# Patient Record
Sex: Male | Born: 1995 | Race: Black or African American | Hispanic: No | Marital: Single | Smoking: Never smoker
Health system: Southern US, Community
[De-identification: ages and names within clinical notes are randomized; demographics above are authoritative.]

## PROBLEM LIST (undated history)

## (undated) DIAGNOSIS — R000 Tachycardia, unspecified: Secondary | ICD-10-CM

## (undated) DIAGNOSIS — R339 Retention of urine, unspecified: Secondary | ICD-10-CM

## (undated) DIAGNOSIS — E46 Unspecified protein-calorie malnutrition: Secondary | ICD-10-CM

## (undated) DIAGNOSIS — R569 Unspecified convulsions: Secondary | ICD-10-CM

## (undated) DIAGNOSIS — E119 Type 2 diabetes mellitus without complications: Secondary | ICD-10-CM

## (undated) DIAGNOSIS — I63331 Cerebral infarction due to thrombosis of right posterior cerebral artery: Secondary | ICD-10-CM

## (undated) DIAGNOSIS — S069XAA Unspecified intracranial injury with loss of consciousness status unknown, initial encounter: Secondary | ICD-10-CM

---

## 2021-09-25 ENCOUNTER — Emergency Department (HOSPITAL_COMMUNITY): Payer: Medicaid Other

## 2021-09-25 ENCOUNTER — Inpatient Hospital Stay (HOSPITAL_COMMUNITY)
Admission: EM | Admit: 2021-09-25 | Discharge: 2021-11-25 | DRG: 003 | Disposition: A | Discharge status: Skilled Nursing Facility | Payer: Medicaid Other | Attending: Surgery | Admitting: Surgery

## 2021-09-25 DIAGNOSIS — Z20822 Contact with and (suspected) exposure to covid-19: Secondary | ICD-10-CM | POA: Diagnosis present

## 2021-09-25 DIAGNOSIS — B9620 Unspecified Escherichia coli [E. coli] as the cause of diseases classified elsewhere: Secondary | ICD-10-CM | POA: Diagnosis not present

## 2021-09-25 DIAGNOSIS — G8191 Hemiplegia, unspecified affecting right dominant side: Secondary | ICD-10-CM | POA: Diagnosis present

## 2021-09-25 DIAGNOSIS — R21 Rash and other nonspecific skin eruption: Secondary | ICD-10-CM | POA: Diagnosis not present

## 2021-09-25 DIAGNOSIS — K9421 Gastrostomy hemorrhage: Secondary | ICD-10-CM | POA: Diagnosis not present

## 2021-09-25 DIAGNOSIS — S02119A Unspecified fracture of occiput, initial encounter for closed fracture: Secondary | ICD-10-CM | POA: Diagnosis present

## 2021-09-25 DIAGNOSIS — I63531 Cerebral infarction due to unspecified occlusion or stenosis of right posterior cerebral artery: Secondary | ICD-10-CM | POA: Diagnosis not present

## 2021-09-25 DIAGNOSIS — R1310 Dysphagia, unspecified: Secondary | ICD-10-CM | POA: Diagnosis present

## 2021-09-25 DIAGNOSIS — I10 Essential (primary) hypertension: Secondary | ICD-10-CM | POA: Diagnosis present

## 2021-09-25 DIAGNOSIS — Y92410 Unspecified street and highway as the place of occurrence of the external cause: Secondary | ICD-10-CM

## 2021-09-25 DIAGNOSIS — R339 Retention of urine, unspecified: Secondary | ICD-10-CM | POA: Diagnosis not present

## 2021-09-25 DIAGNOSIS — E876 Hypokalemia: Secondary | ICD-10-CM | POA: Diagnosis present

## 2021-09-25 DIAGNOSIS — S066XAA Traumatic subarachnoid hemorrhage with loss of consciousness status unknown, initial encounter: Principal | ICD-10-CM | POA: Diagnosis present

## 2021-09-25 DIAGNOSIS — R402212 Coma scale, best verbal response, none, at arrival to emergency department: Secondary | ICD-10-CM | POA: Diagnosis present

## 2021-09-25 DIAGNOSIS — Z515 Encounter for palliative care: Secondary | ICD-10-CM

## 2021-09-25 DIAGNOSIS — J189 Pneumonia, unspecified organism: Secondary | ICD-10-CM | POA: Diagnosis not present

## 2021-09-25 DIAGNOSIS — Y9301 Activity, walking, marching and hiking: Secondary | ICD-10-CM | POA: Diagnosis present

## 2021-09-25 DIAGNOSIS — R403 Persistent vegetative state: Secondary | ICD-10-CM | POA: Diagnosis present

## 2021-09-25 DIAGNOSIS — Y95 Nosocomial condition: Secondary | ICD-10-CM | POA: Diagnosis not present

## 2021-09-25 DIAGNOSIS — R402122 Coma scale, eyes open, to pain, at arrival to emergency department: Secondary | ICD-10-CM | POA: Diagnosis present

## 2021-09-25 DIAGNOSIS — S065XAA Traumatic subdural hemorrhage with loss of consciousness status unknown, initial encounter: Secondary | ICD-10-CM | POA: Diagnosis present

## 2021-09-25 DIAGNOSIS — R569 Unspecified convulsions: Secondary | ICD-10-CM | POA: Diagnosis not present

## 2021-09-25 DIAGNOSIS — Z23 Encounter for immunization: Secondary | ICD-10-CM | POA: Diagnosis not present

## 2021-09-25 DIAGNOSIS — J9601 Acute respiratory failure with hypoxia: Secondary | ICD-10-CM | POA: Diagnosis present

## 2021-09-25 DIAGNOSIS — S061XAA Traumatic cerebral edema with loss of consciousness status unknown, initial encounter: Secondary | ICD-10-CM | POA: Diagnosis present

## 2021-09-25 DIAGNOSIS — R402342 Coma scale, best motor response, flexion withdrawal, at arrival to emergency department: Secondary | ICD-10-CM | POA: Diagnosis present

## 2021-09-25 DIAGNOSIS — J9811 Atelectasis: Secondary | ICD-10-CM | POA: Diagnosis not present

## 2021-09-25 DIAGNOSIS — T1490XA Injury, unspecified, initial encounter: Principal | ICD-10-CM

## 2021-09-25 DIAGNOSIS — S069XAA Unspecified intracranial injury with loss of consciousness status unknown, initial encounter: Secondary | ICD-10-CM

## 2021-09-25 LAB — CBC
HCT: 44.3 % (ref 39.0–52.0)
Hemoglobin: 14.6 g/dL (ref 13.0–17.0)
MCH: 31 pg (ref 26.0–34.0)
MCHC: 33 g/dL (ref 30.0–36.0)
MCV: 94.1 fL (ref 80.0–100.0)
Platelets: 205 10*3/uL (ref 150–400)
RBC: 4.71 MIL/uL (ref 4.22–5.81)
RDW: 11.8 % (ref 11.5–15.5)
WBC: 18.8 10*3/uL — ABNORMAL HIGH (ref 4.0–10.5)
nRBC: 0 % (ref 0.0–0.2)

## 2021-09-25 LAB — SAMPLE TO BLOOD BANK

## 2021-09-25 LAB — I-STAT CHEM 8, ED
BUN: 12 mg/dL (ref 6–20)
Calcium, Ion: 1.12 mmol/L — ABNORMAL LOW (ref 1.15–1.40)
Chloride: 107 mmol/L (ref 98–111)
Creatinine, Ser: 1.4 mg/dL — ABNORMAL HIGH (ref 0.61–1.24)
Glucose, Bld: 128 mg/dL — ABNORMAL HIGH (ref 70–99)
HCT: 43 % (ref 39.0–52.0)
Hemoglobin: 14.6 g/dL (ref 13.0–17.0)
Potassium: 2.9 mmol/L — ABNORMAL LOW (ref 3.5–5.1)
Sodium: 144 mmol/L (ref 135–145)
TCO2: 24 mmol/L (ref 22–32)

## 2021-09-25 LAB — COMPREHENSIVE METABOLIC PANEL
ALT: 39 U/L (ref 0–44)
AST: 37 U/L (ref 15–41)
Albumin: 3.9 g/dL (ref 3.5–5.0)
Alkaline Phosphatase: 61 U/L (ref 38–126)
Anion gap: 12 (ref 5–15)
BUN: 11 mg/dL (ref 6–20)
CO2: 22 mmol/L (ref 22–32)
Calcium: 8.9 mg/dL (ref 8.9–10.3)
Chloride: 109 mmol/L (ref 98–111)
Creatinine, Ser: 1.42 mg/dL — ABNORMAL HIGH (ref 0.61–1.24)
GFR, Estimated: >60 mL/min (ref >60)
Glucose, Bld: 131 mg/dL — ABNORMAL HIGH (ref 70–99)
Potassium: 2.8 mmol/L — ABNORMAL LOW (ref 3.5–5.1)
Sodium: 143 mmol/L (ref 135–145)
Total Bilirubin: 1.2 mg/dL (ref 0.3–1.2)
Total Protein: 6.2 g/dL — ABNORMAL LOW (ref 6.5–8.1)

## 2021-09-25 LAB — ETHANOL: Alcohol, Ethyl (B): <10 mg/dL (ref <10)

## 2021-09-25 LAB — PROTIME-INR
INR: 1.2 (ref 0.8–1.2)
Prothrombin Time: 15 seconds (ref 11.4–15.2)
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
## 2021-09-25 IMAGING — CT CT CERVICAL SPINE W/O CM
4 of 5 series · 12 of 34 positions shown, 14 images · non-contrast
Comparison: None available.

CLINICAL DATA: Initial evaluation for acute head trauma, abnormal
mental status.



[Series 7: c spine soft thins · axial · 0.36mm/px · z∈[-12,+20]mm · 2 of 191 slices shown]
[im 32/191  soft-tissue]
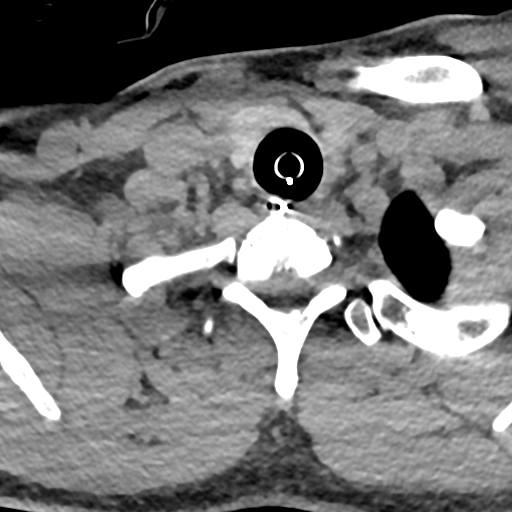
[im 64/191  soft-tissue]
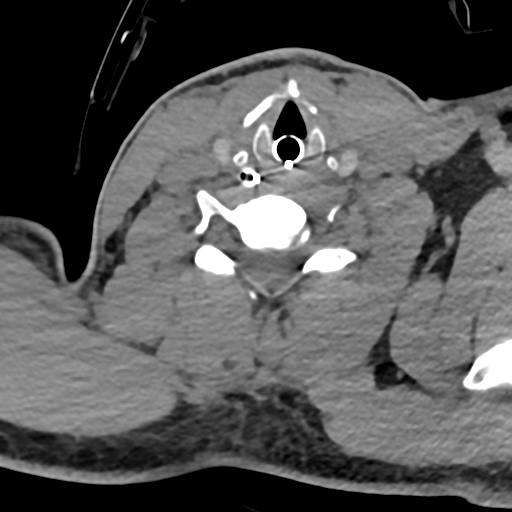

[Series 8: sag bone · sagittal · 0.34mm/px · 5 of 108 slices shown, 6 images]
[im 36/108  bone]
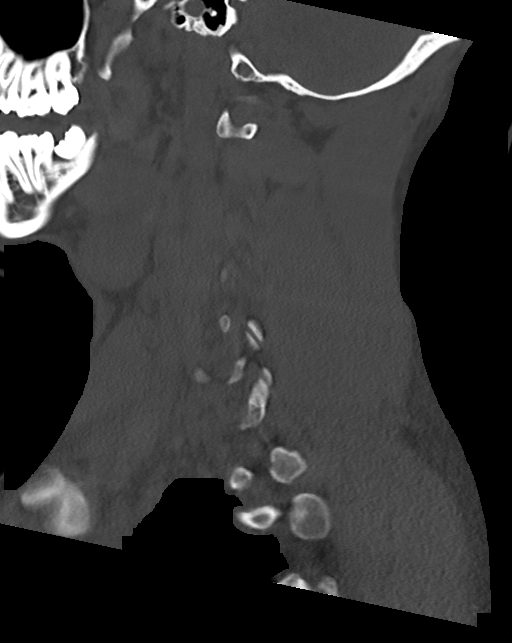
[im 45/108  bone]
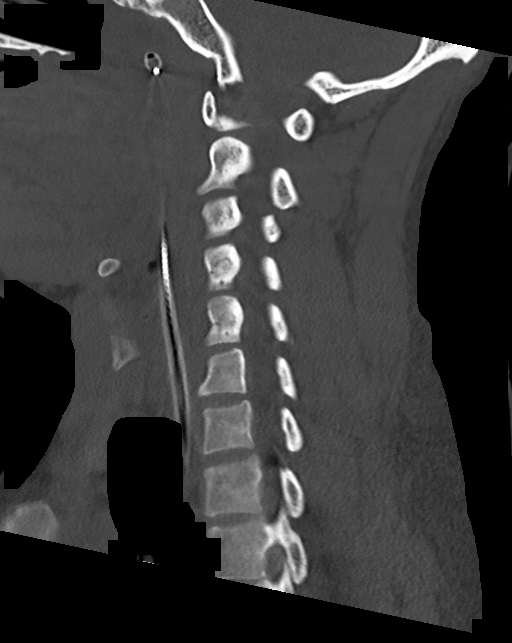
[im 54/108  soft-tissue]
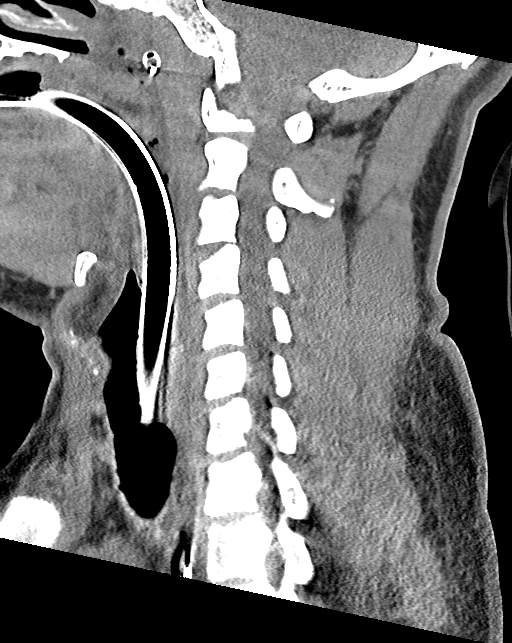
[im 54/108  bone]
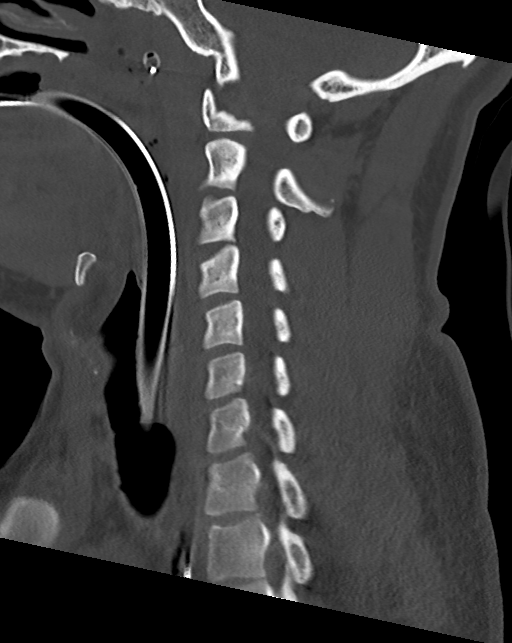
[im 63/108  bone]
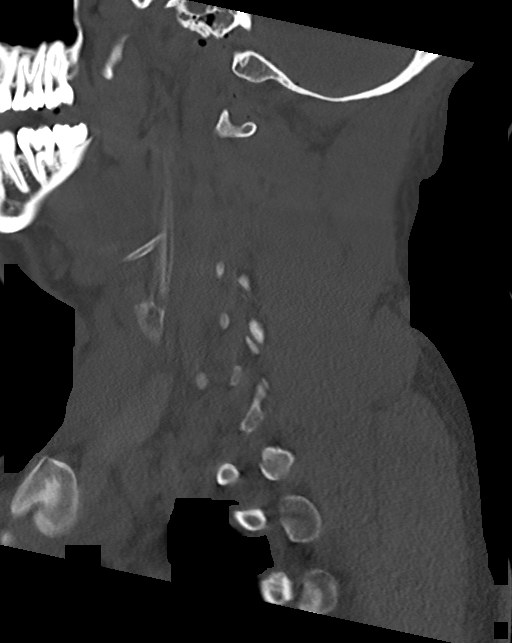
[im 72/108  bone]
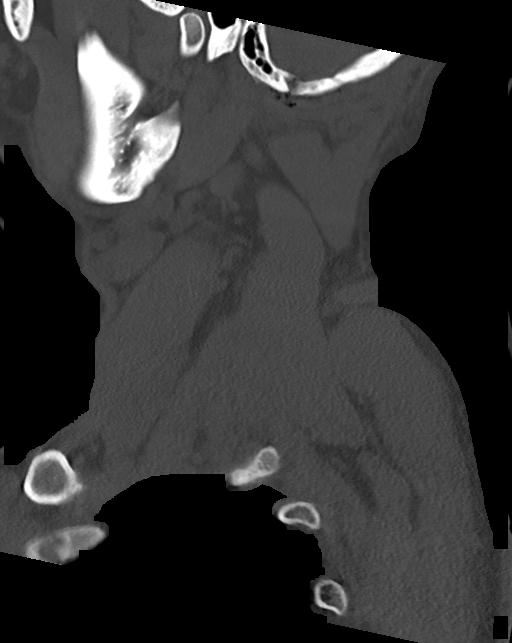

[Series 9: cor bone · coronal · 0.37mm/px · 3 of 69 slices shown]
[im 14/69  bone]
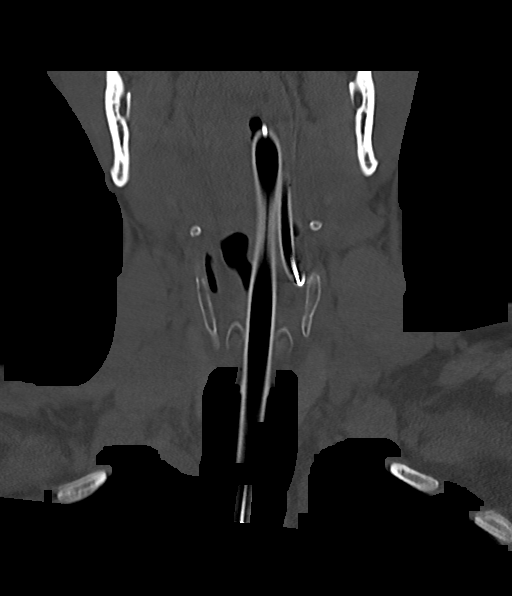
[im 28/69  bone]
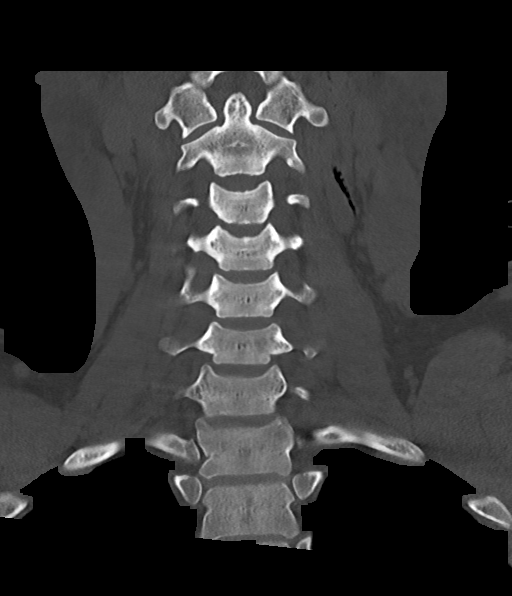
[im 41/69  bone]
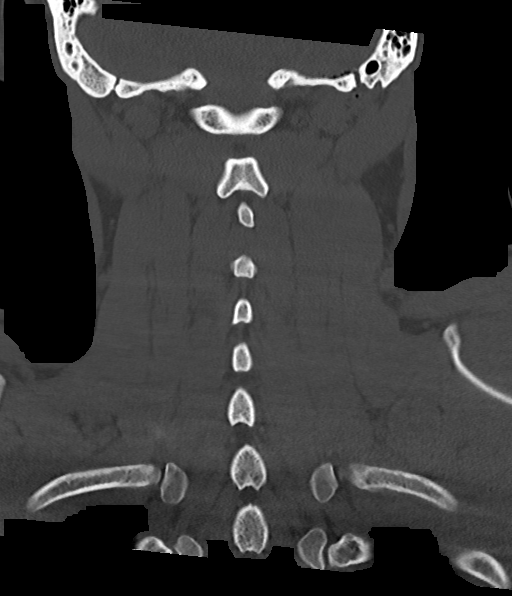

[Series 10: orthogonal axials · axial · 0.21mm/px · z∈[+2,+59]mm · 2 of 96 slices shown, 3 images]
[im 32/96  soft-tissue]
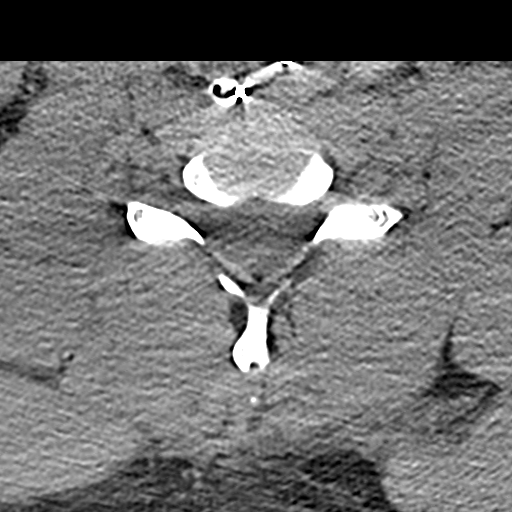
[im 32/96  bone]
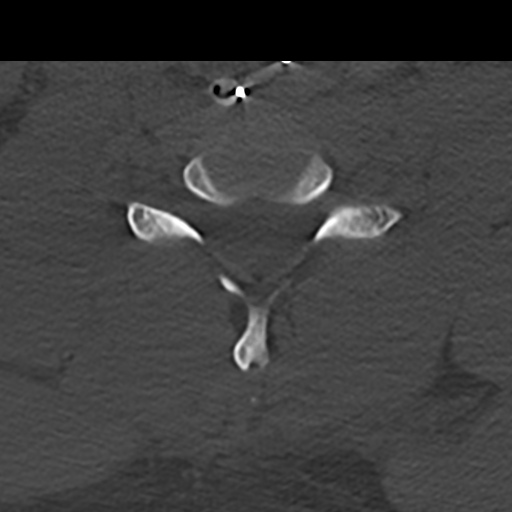
[im 64/96  bone]
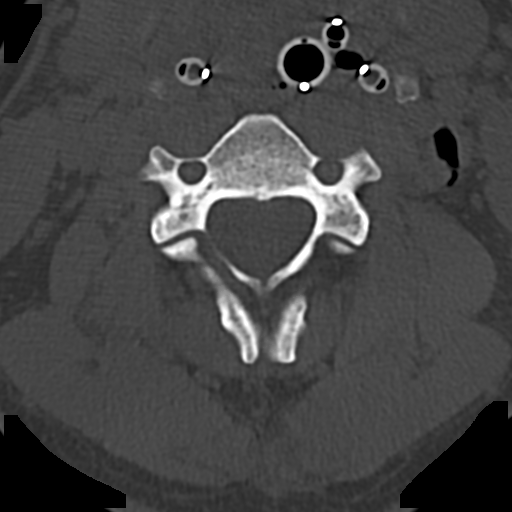

[12 of 34 positions shown; findings below may reference images not displayed]

FINDINGS: CT HEAD FINDINGS

Brain: Acute extra-axial hemorrhage overlying the right cerebral
convexity measures up to 6 mm in maximal thickness at the level of
the right frontotemporal region, likely subdural in location.
Extra-axial blood extends along the falx, with the parafalcine
component measuring up to 6 mm in maximal thickness as well.
Associated scattered posttraumatic subarachnoid hemorrhage along the
anterior falx and bilateral frontal regions. Subarachnoid blood also
seen about the basilar cisterns, most notable at the prepontine
cistern. Associated evolving bifrontal hemorrhagic contusions
involving the anterior inferior frontal lobes and at least right
temporal pole. Associated mass effect with up to 7 mm of
right-to-left shift. No hydrocephalus or trapping at this point.
Associated crowding of the basilar cisterns without frank
transtentorial herniation. Scattered small volume pneumocephalus
overlies the posterior left cerebral convexity related to an
overlying calvarial fracture. Small volume extra-axial blood also
seen overlying the left cerebral convexity, measuring up to 3 mm in
maximal thickness. No acute infarct. No mass lesion.

Vascular: No visible hyperdense vessel, although evaluation limited
by the presence of subarachnoid hemorrhage.

Skull: Acute calvarial fracture involving the left frontoparietal
and temporal calvarium (series 4, image 63). Associated minimal 3 mm
of displacement. Extension along the right lambdoid suture with
slight asymmetric diastasis/widening. Note again made of scattered
foci of underlying pneumocephalus. Overlying soft tissue
swelling/contusion and emphysema present at the left
parieto-occipital scalp. No visible involvement of the tympanic or
petrosal portions of the left temporal bone. Medial extension of the
fracture across the skull base, with involvement of the dominant
left sphenoid sinus (series 4, image 23). Probable extension through
the left carotid canal as well (series 4, image 23). Few scattered
foci of gas present within the left carotid canal itself. Probable
subtle extension through the sella turcica and clivus noted as well.

Sinuses/Orbits: Globes and orbital soft tissues within normal
limits. Layering blood products present within the left sphenoid
sinus related to the skull base fracture. Paranasal sinuses are
otherwise clear. Nasogastric tube partially visualized coiled within
the oropharynx.

Other: None.

CT CERVICAL SPINE FINDINGS

Alignment: Straightening of the normal cervical lordosis. No
listhesis or malalignment.

Skull base and vertebrae: Skull base intact. Normal C1-2
articulations are preserved in the dens is intact. Vertebral body
heights maintained. No acute fracture.

Soft tissues and spinal canal: Scattered soft tissue emphysema
present within the upper left neck related to the left-sided skull
fractures. Enteric tube partially coiled within the oropharynx
before coursing inferiorly. Endotracheal tube in place as well.
Spinal canal within normal limits.

Disc levels:  Unremarkable.

Upper chest: Visualized upper chest demonstrates no acute finding.
Partially visualized lung apices are clear.

Other: None.
IMPRESSION: CT BRAIN:

1. Acute extra-axial hemorrhage overlying the right cerebral
convexity measuring up to 6 mm in maximal thickness, likely subdural
in location. Associated scattered posttraumatic subarachnoid
hemorrhage along the falx, anterior frontal lobes, and basilar
cisterns, with evolving bifrontal hemorrhagic contusions. Associated
mass effect with up to 7 mm of right-to-left shift. No hydrocephalus
or trapping at this time.
2. Additional smaller extra-axial hemorrhage overlying the left
cerebral convexity measuring up to 3 mm in maximal thickness.
3. Acute complex fracture involving the left calvarium, with
extension along the left lambdoid suture and across the skull base.
Probable extension through the left carotid canal. Further
evaluation with dedicated CTA recommended to evaluate for potential
vascular injury.

CT CERVICAL SPINE:

No acute traumatic injury within the cervical spine.

Critical Value/emergent results were called by telephone at the time
of interpretation on [DATE] at [DATE] to provider Dr. LANCIU,
Who verbally acknowledged these results.
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
## 2021-09-25 IMAGING — CT CT CHEST-ABD-PELV W/ CM
2 of 5 series · 14 of 46 positions shown, 16 images · IV contrast (omnipaque)
Comparison: None.

CLINICAL DATA: Status post trauma.

EXAM:
CT CHEST, ABDOMEN, AND PELVIS WITH CONTRAST
TECHNIQUE: Multidetector CT imaging of the chest, abdomen and pelvis was
performed following the standard protocol during bolus
administration of intravenous contrast.
RADIATION DOSE REDUCTION: This exam was performed according to the
departmental dose-optimization program which includes automated
exposure control, adjustment of the mA and/or kV according to
patient size and/or use of iterative reconstruction technique.
CONTRAST:  Living mL of Omnipaque 350

[Series 3: cap with · axial · 0.80mm/px · z∈[-590,-40]mm · 11 of 130 slices shown, 13 images]
[im 10/130  soft-tissue]
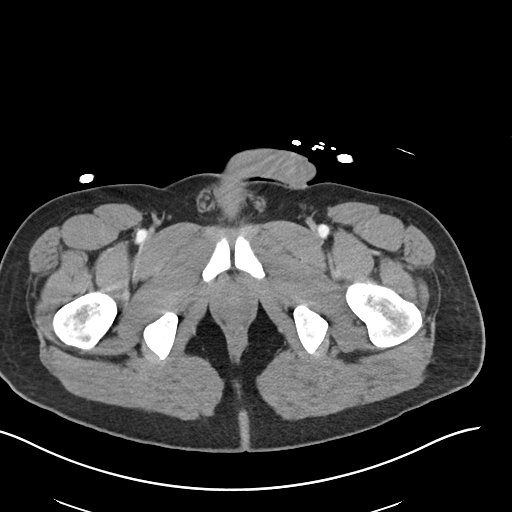
[im 10/130  bone]
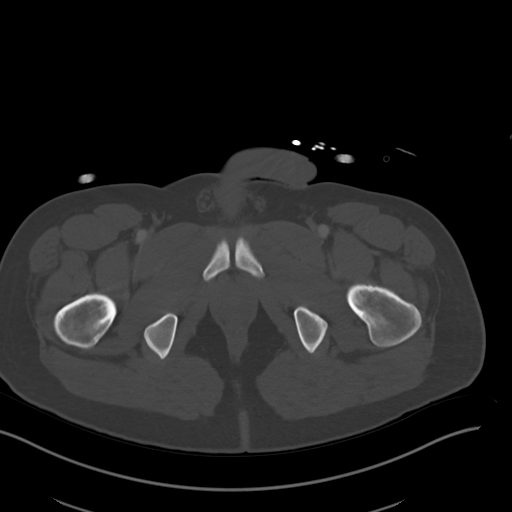
[im 19/130  soft-tissue]
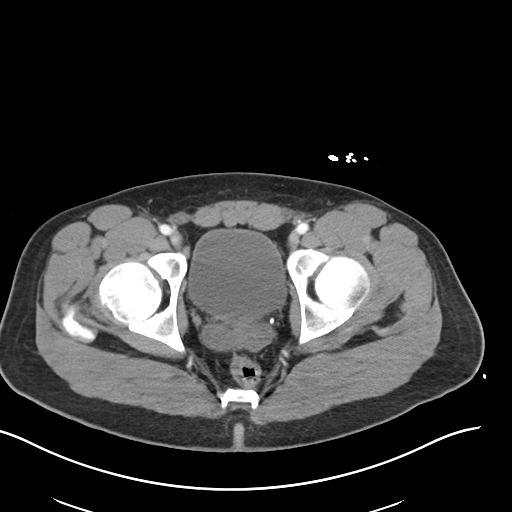
[im 28/130  soft-tissue]
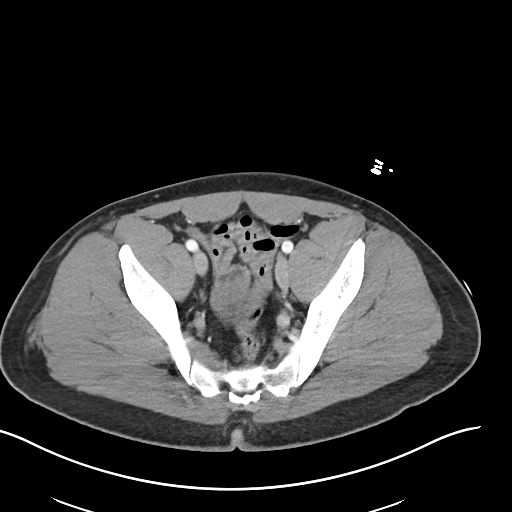
[im 47/130  soft-tissue]
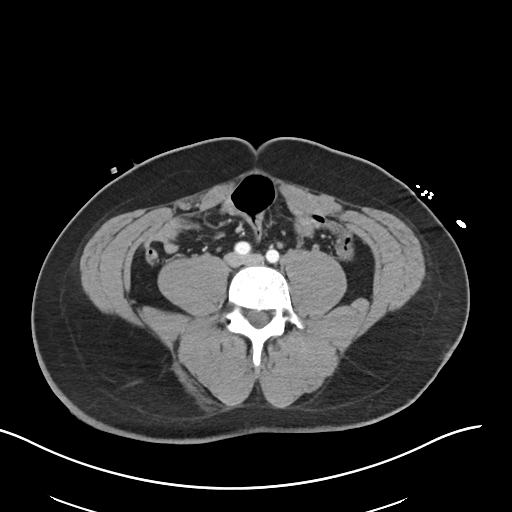
[im 56/130  soft-tissue]
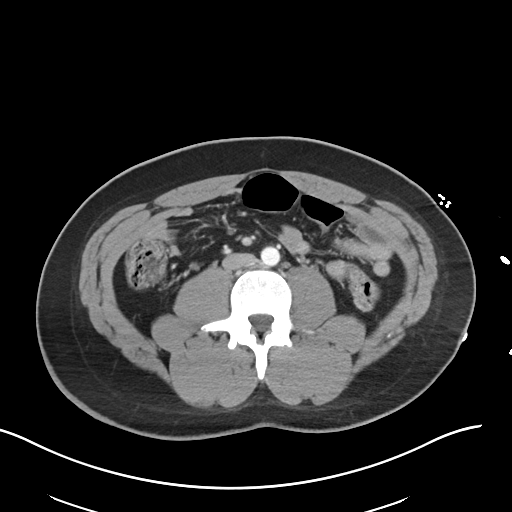
[im 65/130  soft-tissue]
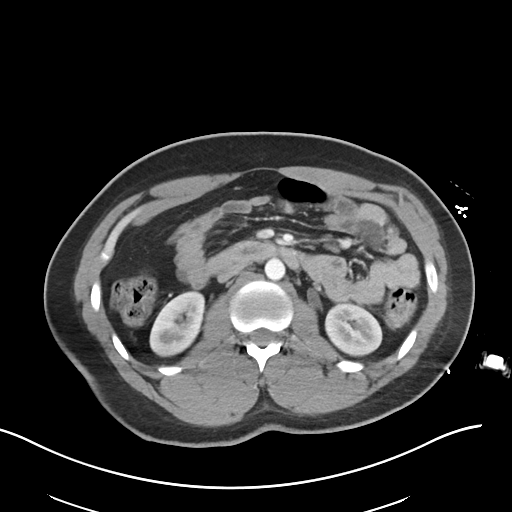
[im 74/130  soft-tissue]
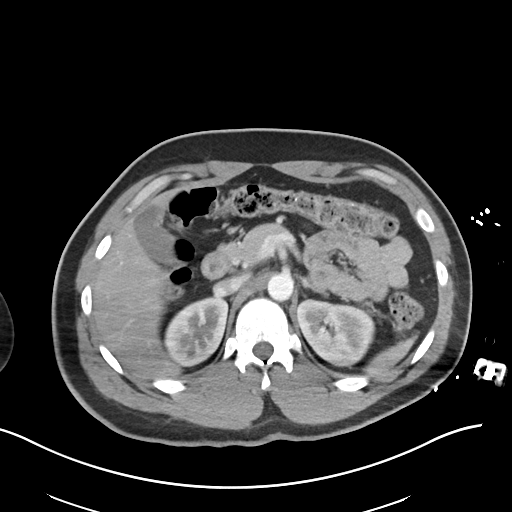
[im 83/130  soft-tissue]
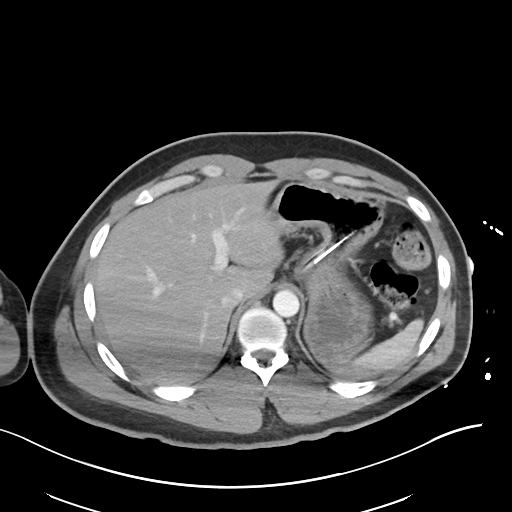
[im 102/130  soft-tissue]
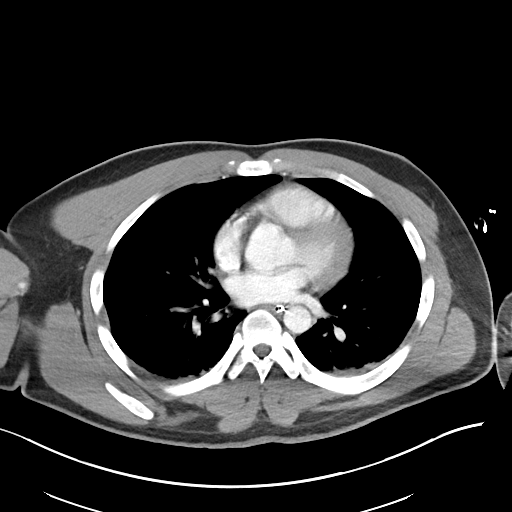
[im 102/130  bone]
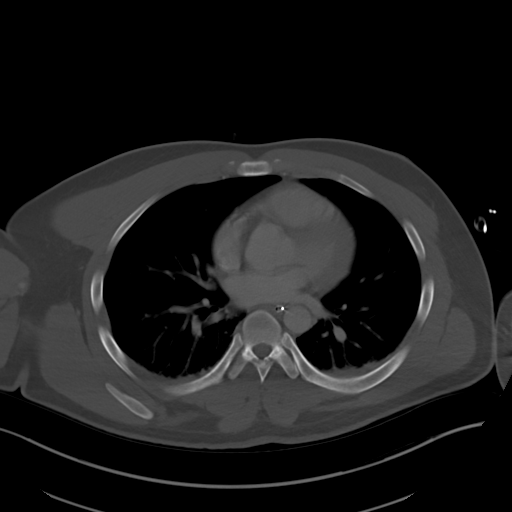
[im 111/130  soft-tissue]
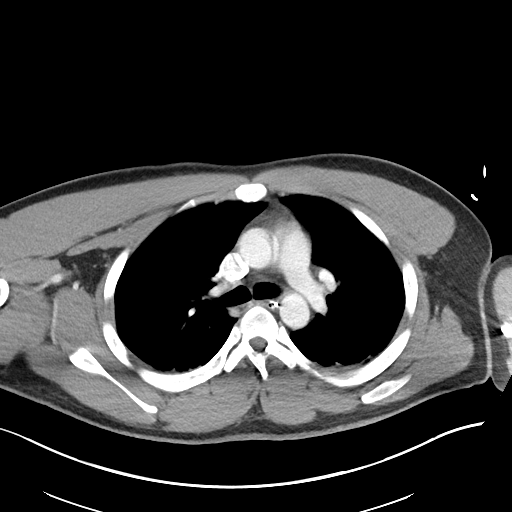
[im 120/130  soft-tissue]
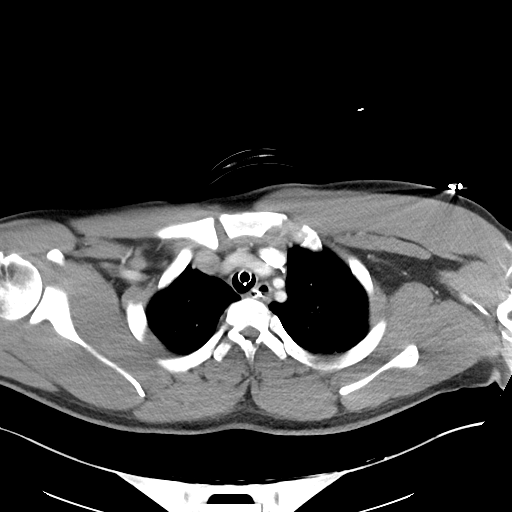

[Series 6: cor · coronal · 0.75mm/px · 3 of 87 slices shown]
[im 29/87  soft-tissue]
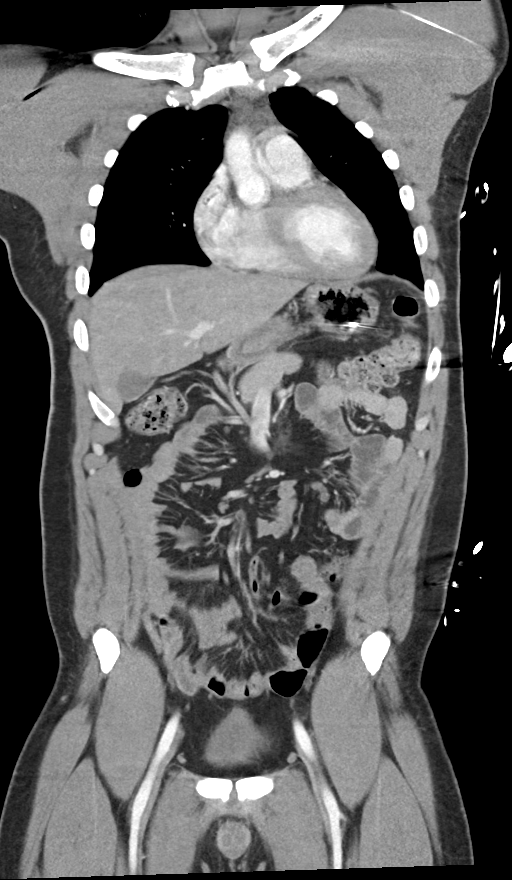
[im 39/87  soft-tissue]
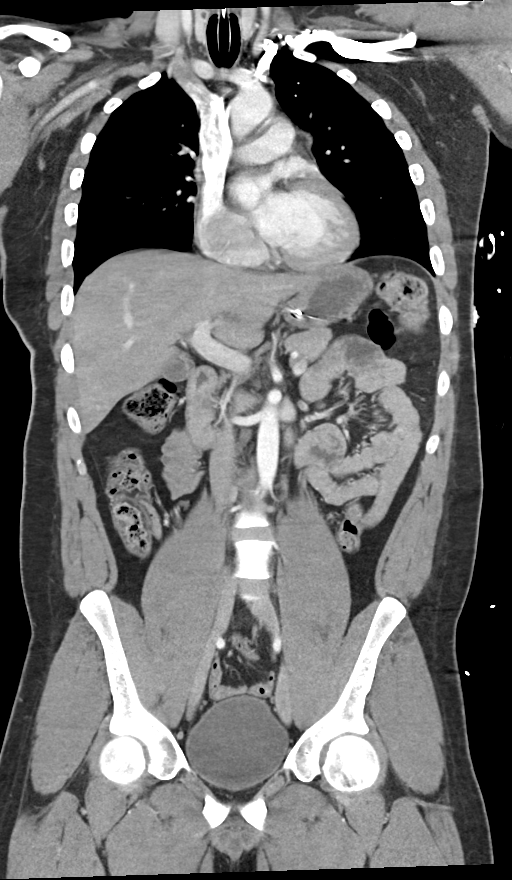
[im 48/87  soft-tissue]
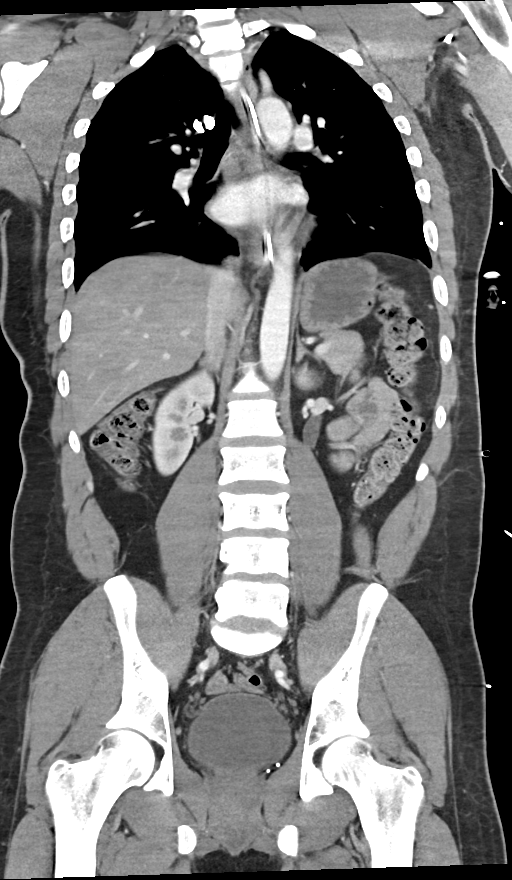

[14 of 46 positions shown; findings below may reference images not displayed]

FINDINGS: CT CHEST FINDINGS

Cardiovascular: No significant vascular findings. Normal heart size.
No pericardial effusion.

Mediastinum/Nodes: Endotracheal and nasogastric tubes are in place
and are properly positioned. No enlarged mediastinal, hilar, or
axillary lymph nodes. Thyroid gland, trachea, and esophagus
demonstrate no significant findings.

Lungs/Pleura: Mild atelectatic changes are seen along the posterior
aspects of the bilateral upper lobes and bilateral lower lobes.

There is no evidence of a pleural effusion or pneumothorax.

Musculoskeletal: No chest wall mass or suspicious bone lesions
identified.

CT ABDOMEN PELVIS FINDINGS

Hepatobiliary: No focal liver abnormality is seen. No gallstones,
gallbladder wall thickening, or biliary dilatation.

Pancreas: Unremarkable. No pancreatic ductal dilatation or
surrounding inflammatory changes.

Spleen: Normal in size without focal abnormality.

Adrenals/Urinary Tract: Adrenal glands are unremarkable. Kidneys are
normal, without renal calculi or focal lesions. A mildly prominent
left extrarenal pelvis is seen. Bladder is unremarkable.

Stomach/Bowel: Stomach is within normal limits. Appendix appears
normal. No evidence of bowel wall thickening, distention, or
inflammatory changes.

Vascular/Lymphatic: No significant vascular findings are present. No
enlarged abdominal or pelvic lymph nodes.

Reproductive: Prostate is unremarkable.

Other: No abdominal wall hernia or abnormality. No abdominopelvic
ascites.

Musculoskeletal: No acute or significant osseous findings.
IMPRESSION: 1. Mild posterior bilateral upper lobe and bilateral lower lobe
atelectasis.
2. No evidence of a pleural effusion or pneumothorax.
3. No evidence of an acute or active process within the abdomen or
pelvis.
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
## 2021-09-25 MED ORDER — ROCURONIUM BROMIDE 50 MG/5ML IV SOLN
INTRAVENOUS | Status: AC | PRN
Start: 2021-09-25 — End: 2021-09-25
  Administered 2021-09-25: 90 mg via INTRAVENOUS

## 2021-09-25 MED ORDER — IOHEXOL 350 MG/ML SOLN
80.0000 mL | Freq: Once | INTRAVENOUS | Status: AC | PRN
  Administered 2021-09-25: 80 mL via INTRAVENOUS

## 2021-09-25 MED ORDER — FENTANYL 2500MCG IN NS 250ML (10MCG/ML) PREMIX INFUSION
0.0000 ug/h | INTRAVENOUS | Status: DC
  Administered 2021-09-25: 50 ug/h via INTRAVENOUS
  Filled 2021-09-25: qty 250

## 2021-09-25 MED ORDER — SODIUM CHLORIDE 0.9 % IV SOLN: INTRAVENOUS | Status: DC

## 2021-09-25 MED ORDER — FENTANYL BOLUS VIA INFUSION
50.0000 ug | INTRAVENOUS | Status: DC | PRN
  Administered 2021-09-29 – 2021-10-05 (×4): 50 ug via INTRAVENOUS
  Filled 2021-09-25: qty 100

## 2021-09-25 MED ORDER — PROPOFOL 1000 MG/100ML IV EMUL
5.0000 ug/kg/min | INTRAVENOUS | Status: DC
  Administered 2021-09-25: 10 ug/kg/min via INTRAVENOUS

## 2021-09-25 MED ORDER — ORAL CARE MOUTH RINSE
15.0000 mL | OROMUCOSAL | Status: DC
  Administered 2021-09-26 – 2021-10-17 (×209): 15 mL via OROMUCOSAL

## 2021-09-25 MED ORDER — PROPOFOL 1000 MG/100ML IV EMUL: 0.0000 ug/kg/min | INTRAVENOUS | Status: DC

## 2021-09-25 MED ORDER — ETOMIDATE 2 MG/ML IV SOLN
INTRAVENOUS | Status: AC | PRN
  Administered 2021-09-25: 20 mg via INTRAVENOUS

## 2021-09-25 MED ORDER — TETANUS-DIPHTH-ACELL PERTUSSIS 5-2.5-18.5 LF-MCG/0.5 IM SUSY
0.5000 mL | PREFILLED_SYRINGE | Freq: Once | INTRAMUSCULAR | Status: AC
  Administered 2021-09-25: 0.5 mL via INTRAMUSCULAR

## 2021-09-25 MED ORDER — DOCUSATE SODIUM 100 MG PO CAPS: 100.0000 mg | ORAL_CAPSULE | Freq: Two times a day (BID) | ORAL | Status: DC

## 2021-09-25 MED ORDER — BISACODYL 10 MG RE SUPP
10.0000 mg | Freq: Every day | RECTAL | Status: DC | PRN
  Administered 2021-10-05: 10 mg via RECTAL
  Filled 2021-09-25: qty 1

## 2021-09-25 MED ORDER — CHLORHEXIDINE GLUCONATE 0.12% ORAL RINSE (MEDLINE KIT)
15.0000 mL | Freq: Two times a day (BID) | OROMUCOSAL | Status: DC
  Administered 2021-09-26 – 2021-10-17 (×44): 15 mL via OROMUCOSAL

## 2021-09-25 MED ORDER — HYDRALAZINE HCL 20 MG/ML IJ SOLN
10.0000 mg | INTRAMUSCULAR | Status: DC | PRN
  Administered 2021-09-26: 10 mg via INTRAVENOUS
  Filled 2021-09-25: qty 1

## 2021-09-25 MED ORDER — LEVETIRACETAM IN NACL 500 MG/100ML IV SOLN
500.0000 mg | Freq: Two times a day (BID) | INTRAVENOUS | Status: AC
  Administered 2021-09-26 – 2021-10-02 (×14): 500 mg via INTRAVENOUS
  Filled 2021-09-25 (×14): qty 100

## 2021-09-25 MED ORDER — METOPROLOL TARTRATE 5 MG/5ML IV SOLN
5.0000 mg | Freq: Four times a day (QID) | INTRAVENOUS | Status: AC | PRN
  Administered 2021-09-26 – 2021-10-02 (×4): 5 mg via INTRAVENOUS
  Filled 2021-09-25 (×4): qty 5

## 2021-09-25 MED ORDER — FENTANYL 2500MCG IN NS 250ML (10MCG/ML) PREMIX INFUSION
0.0000 ug/h | INTRAVENOUS | Status: DC
  Administered 2021-09-26: 100 ug/h via INTRAVENOUS
  Administered 2021-09-26: 50 ug/h via INTRAVENOUS
  Administered 2021-09-27: 25 ug/h via INTRAVENOUS
  Administered 2021-09-30 – 2021-10-04 (×5): 100 ug/h via INTRAVENOUS
  Administered 2021-10-06: 75 ug/h via INTRAVENOUS
  Administered 2021-10-06: 150 ug/h via INTRAVENOUS
  Filled 2021-09-25 (×10): qty 250

## 2021-09-25 MED ORDER — ONDANSETRON HCL 4 MG/2ML IJ SOLN: 4.0000 mg | Freq: Four times a day (QID) | INTRAMUSCULAR | Status: DC | PRN

## 2021-09-25 MED ORDER — ONDANSETRON 4 MG PO TBDP: 4.0000 mg | ORAL_TABLET | Freq: Four times a day (QID) | ORAL | Status: DC | PRN

## 2021-09-25 MED ORDER — FENTANYL CITRATE PF 50 MCG/ML IJ SOSY
50.0000 ug | PREFILLED_SYRINGE | Freq: Once | INTRAMUSCULAR | Status: AC
  Administered 2021-09-26: 50 ug via INTRAVENOUS
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
## 2021-09-25 NOTE — Progress Notes (Signed)
Orthopedic Tech Progress Note ?Patient Details:  ?Sean Bruce ?[birth year] ?[id number] ? ?Patient ID: Dyshaun Bonzo, male   DOB: [birth year], 26 y.o.   MRN: [id number] ?Level I; not needed at the moment. ? ?[place] ?09/25/2021, 11:32 PM ? ?
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
## 2021-09-26 ENCOUNTER — Inpatient Hospital Stay (HOSPITAL_COMMUNITY): Payer: Medicaid Other

## 2021-09-26 ENCOUNTER — Inpatient Hospital Stay (HOSPITAL_COMMUNITY): Payer: Medicaid Other | Admitting: Anesthesiology

## 2021-09-26 ENCOUNTER — Encounter (HOSPITAL_COMMUNITY): Admission: EM | Disposition: A | Discharge status: Skilled Nursing Facility | Payer: Self-pay | Source: Home / Self Care

## 2021-09-26 ENCOUNTER — Encounter (HOSPITAL_COMMUNITY): Payer: Self-pay

## 2021-09-26 ENCOUNTER — Other Ambulatory Visit: Payer: Self-pay

## 2021-09-26 DIAGNOSIS — T1490XA Injury, unspecified, initial encounter: Secondary | ICD-10-CM

## 2021-09-26 DIAGNOSIS — Z515 Encounter for palliative care: Secondary | ICD-10-CM

## 2021-09-26 DIAGNOSIS — S061XAA Traumatic cerebral edema with loss of consciousness status unknown, initial encounter: Secondary | ICD-10-CM

## 2021-09-26 DIAGNOSIS — S065XAA Traumatic subdural hemorrhage with loss of consciousness status unknown, initial encounter: Secondary | ICD-10-CM

## 2021-09-26 DIAGNOSIS — S020XXA Fracture of vault of skull, initial encounter for closed fracture: Secondary | ICD-10-CM

## 2021-09-26 HISTORY — PX: CRANIOTOMY: SHX93

## 2021-09-26 LAB — URINALYSIS, ROUTINE W REFLEX MICROSCOPIC
Bilirubin Urine: NEGATIVE
Glucose, UA: NEGATIVE mg/dL
Ketones, ur: 15 mg/dL — AB
Leukocytes,Ua: NEGATIVE
Nitrite: NEGATIVE
Protein, ur: NEGATIVE mg/dL
Specific Gravity, Urine: <1.005 — ABNORMAL LOW (ref 1.005–1.030)
pH: 6 (ref 5.0–8.0)

## 2021-09-26 LAB — GLUCOSE, CAPILLARY
Glucose-Capillary: 124 mg/dL — ABNORMAL HIGH (ref 70–99)
Glucose-Capillary: 127 mg/dL — ABNORMAL HIGH (ref 70–99)
Glucose-Capillary: 133 mg/dL — ABNORMAL HIGH (ref 70–99)

## 2021-09-26 LAB — I-STAT ARTERIAL BLOOD GAS, ED
Acid-base deficit: 2 mmol/L (ref 0.0–2.0)
Bicarbonate: 24.2 mmol/L (ref 20.0–28.0)
Calcium, Ion: 1.23 mmol/L (ref 1.15–1.40)
HCT: 41 % (ref 39.0–52.0)
Hemoglobin: 13.9 g/dL (ref 13.0–17.0)
O2 Saturation: 100 %
Patient temperature: 98.1
Potassium: 2.9 mmol/L — ABNORMAL LOW (ref 3.5–5.1)
Sodium: 141 mmol/L (ref 135–145)
TCO2: 26 mmol/L (ref 22–32)
pCO2 arterial: 44.7 mmHg (ref 32–48)
pH, Arterial: 7.339 — ABNORMAL LOW (ref 7.35–7.45)
pO2, Arterial: 574 mmHg — ABNORMAL HIGH (ref 83–108)

## 2021-09-26 LAB — CBC
HCT: 48.5 % (ref 39.0–52.0)
Hemoglobin: 16.5 g/dL (ref 13.0–17.0)
MCH: 31.3 pg (ref 26.0–34.0)
MCHC: 34 g/dL (ref 30.0–36.0)
MCV: 92 fL (ref 80.0–100.0)
Platelets: 209 10*3/uL (ref 150–400)
RBC: 5.27 MIL/uL (ref 4.22–5.81)
RDW: 12.1 % (ref 11.5–15.5)
WBC: 24.5 10*3/uL — ABNORMAL HIGH (ref 4.0–10.5)
nRBC: 0 % (ref 0.0–0.2)

## 2021-09-26 LAB — RESP PANEL BY RT-PCR (FLU A&B, COVID) ARPGX2
Influenza A by PCR: NEGATIVE
Influenza B by PCR: NEGATIVE
SARS Coronavirus 2 by RT PCR: NEGATIVE

## 2021-09-26 LAB — BASIC METABOLIC PANEL
Anion gap: 17 — ABNORMAL HIGH (ref 5–15)
BUN: 11 mg/dL (ref 6–20)
CO2: 21 mmol/L — ABNORMAL LOW (ref 22–32)
Calcium: 9.3 mg/dL (ref 8.9–10.3)
Chloride: 107 mmol/L (ref 98–111)
Creatinine, Ser: 1.4 mg/dL — ABNORMAL HIGH (ref 0.61–1.24)
GFR, Estimated: >60 mL/min (ref >60)
Glucose, Bld: 198 mg/dL — ABNORMAL HIGH (ref 70–99)
Potassium: 3.1 mmol/L — ABNORMAL LOW (ref 3.5–5.1)
Sodium: 145 mmol/L (ref 135–145)

## 2021-09-26 LAB — URINALYSIS, MICROSCOPIC (REFLEX): Bacteria, UA: NONE SEEN

## 2021-09-26 LAB — HIV ANTIBODY (ROUTINE TESTING W REFLEX): HIV Screen 4th Generation wRfx: NONREACTIVE

## 2021-09-26 LAB — TRIGLYCERIDES: Triglycerides: 85 mg/dL (ref <150)

## 2021-09-26 LAB — MRSA NEXT GEN BY PCR, NASAL: MRSA by PCR Next Gen: NOT DETECTED

## 2021-09-26 LAB — LACTIC ACID, PLASMA: Lactic Acid, Venous: 3.9 mmol/L (ref 0.5–1.9)

## 2021-09-26 IMAGING — DX DG HAND 2V*R*
4 series · 4 of 4 positions shown · non-contrast
Comparison: None.

CLINICAL DATA: Status post trauma.

EXAM:
RIGHT HAND - 2 VIEW

[hand ap (1 of 2)]
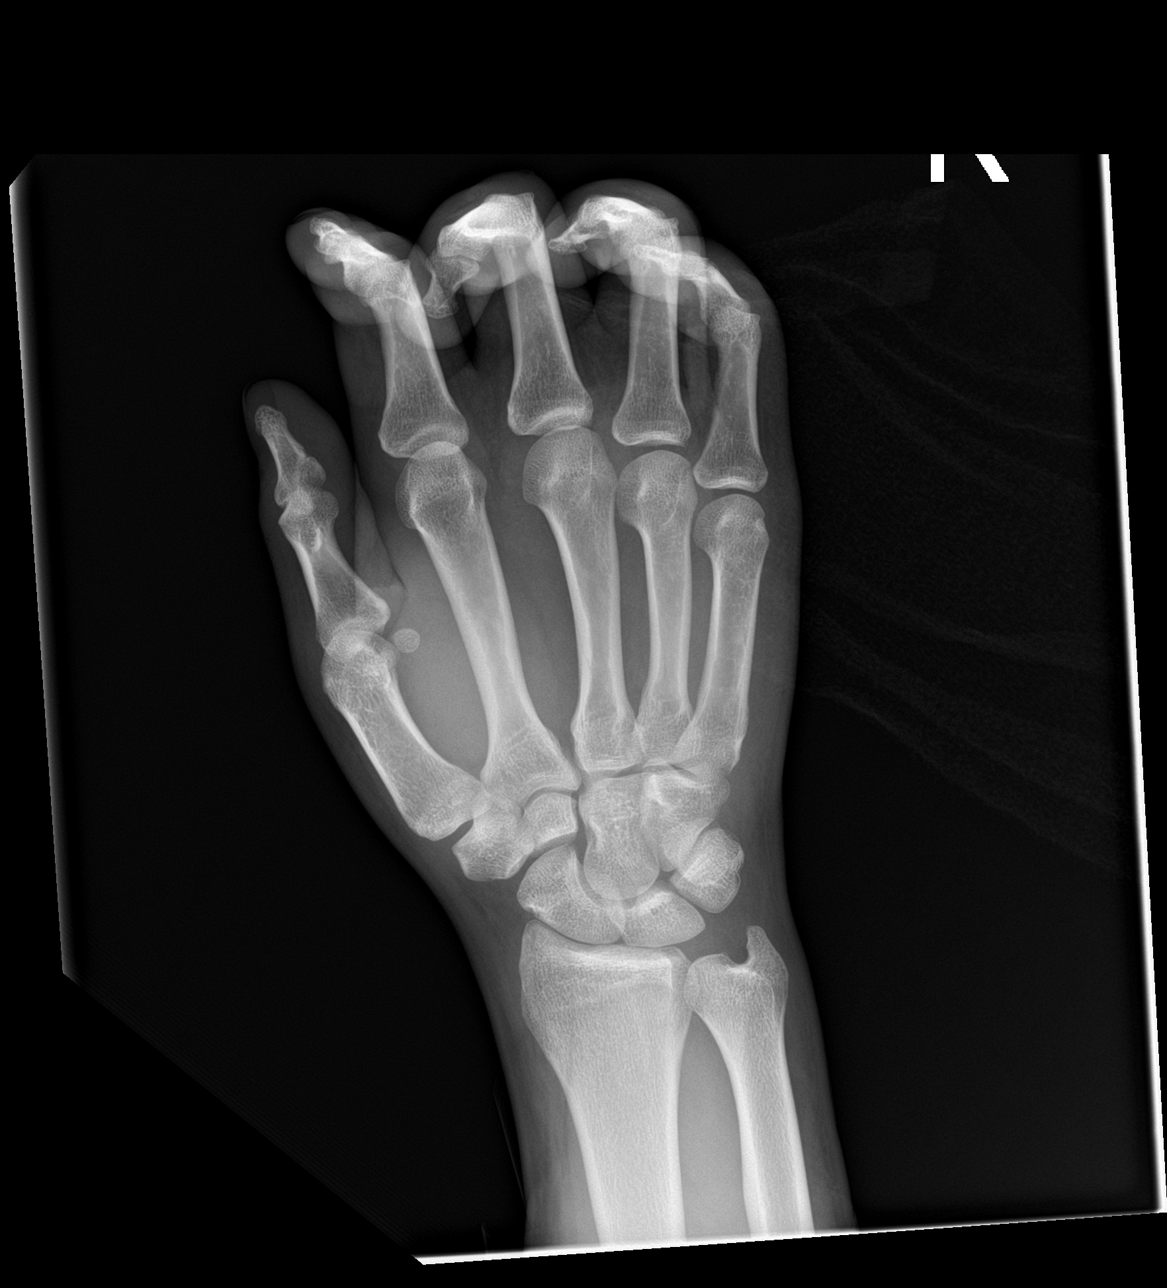

[hand lat (1 of 2)]
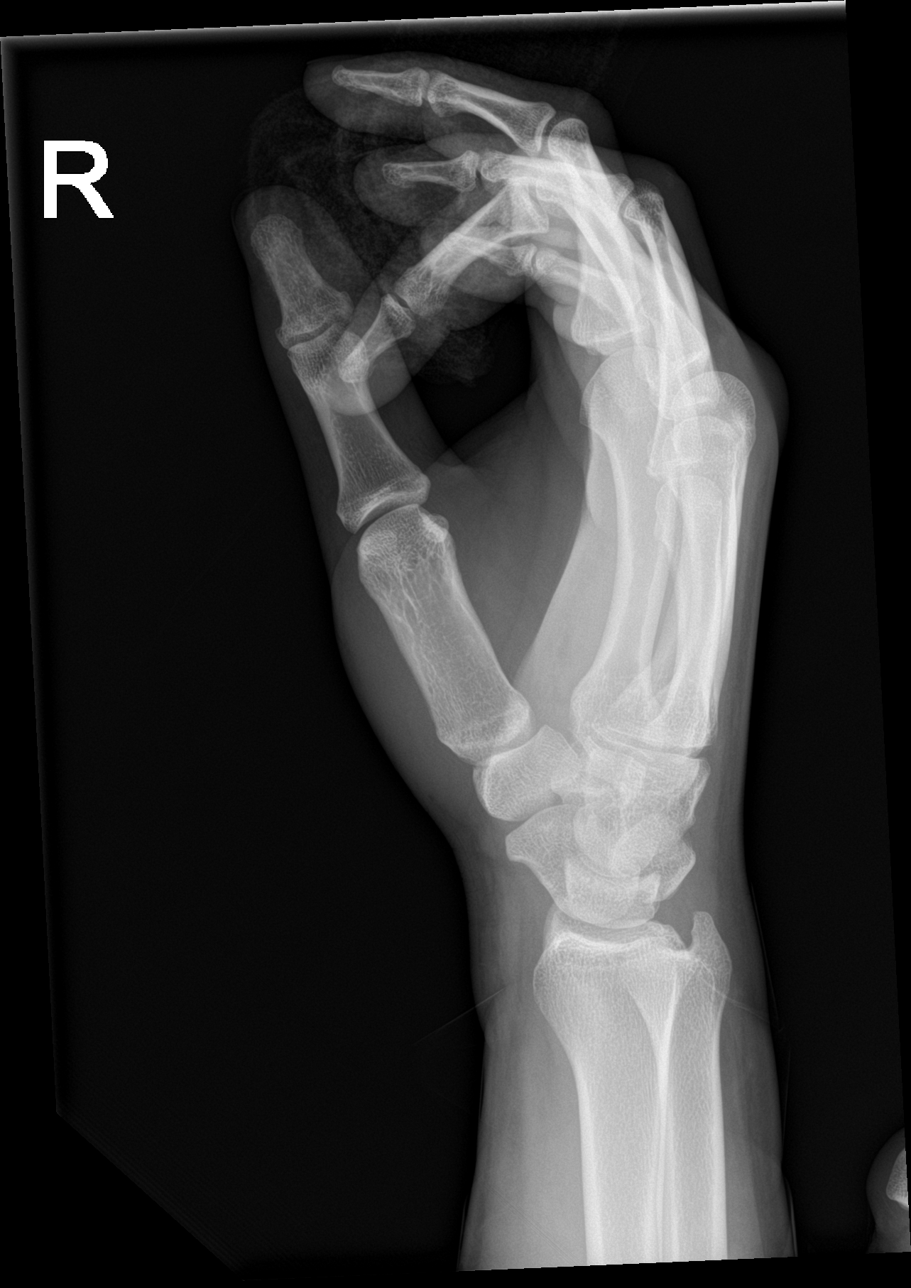

[hand ap (2 of 2)]
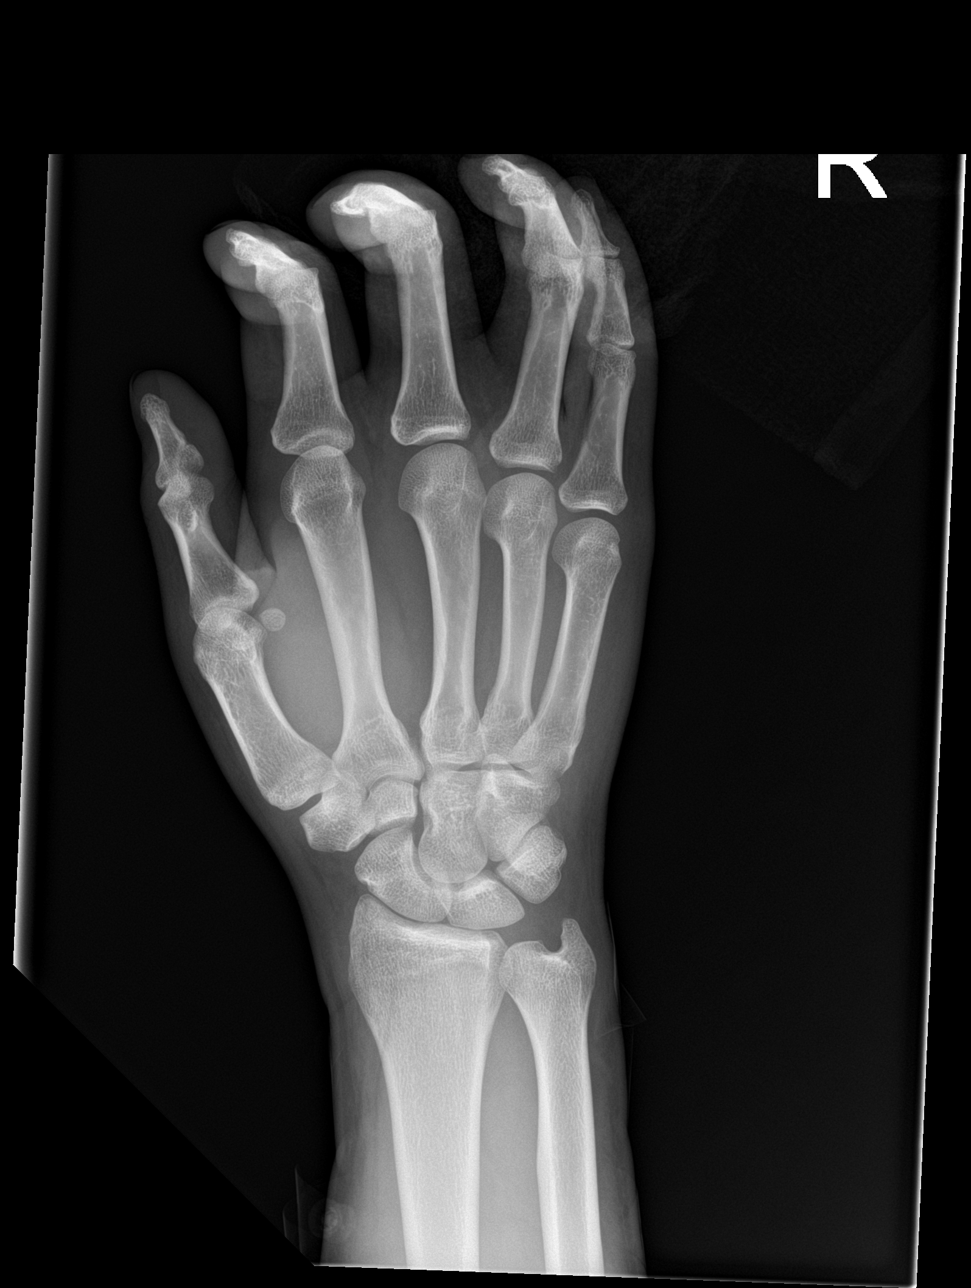

[hand lat (2 of 2)]
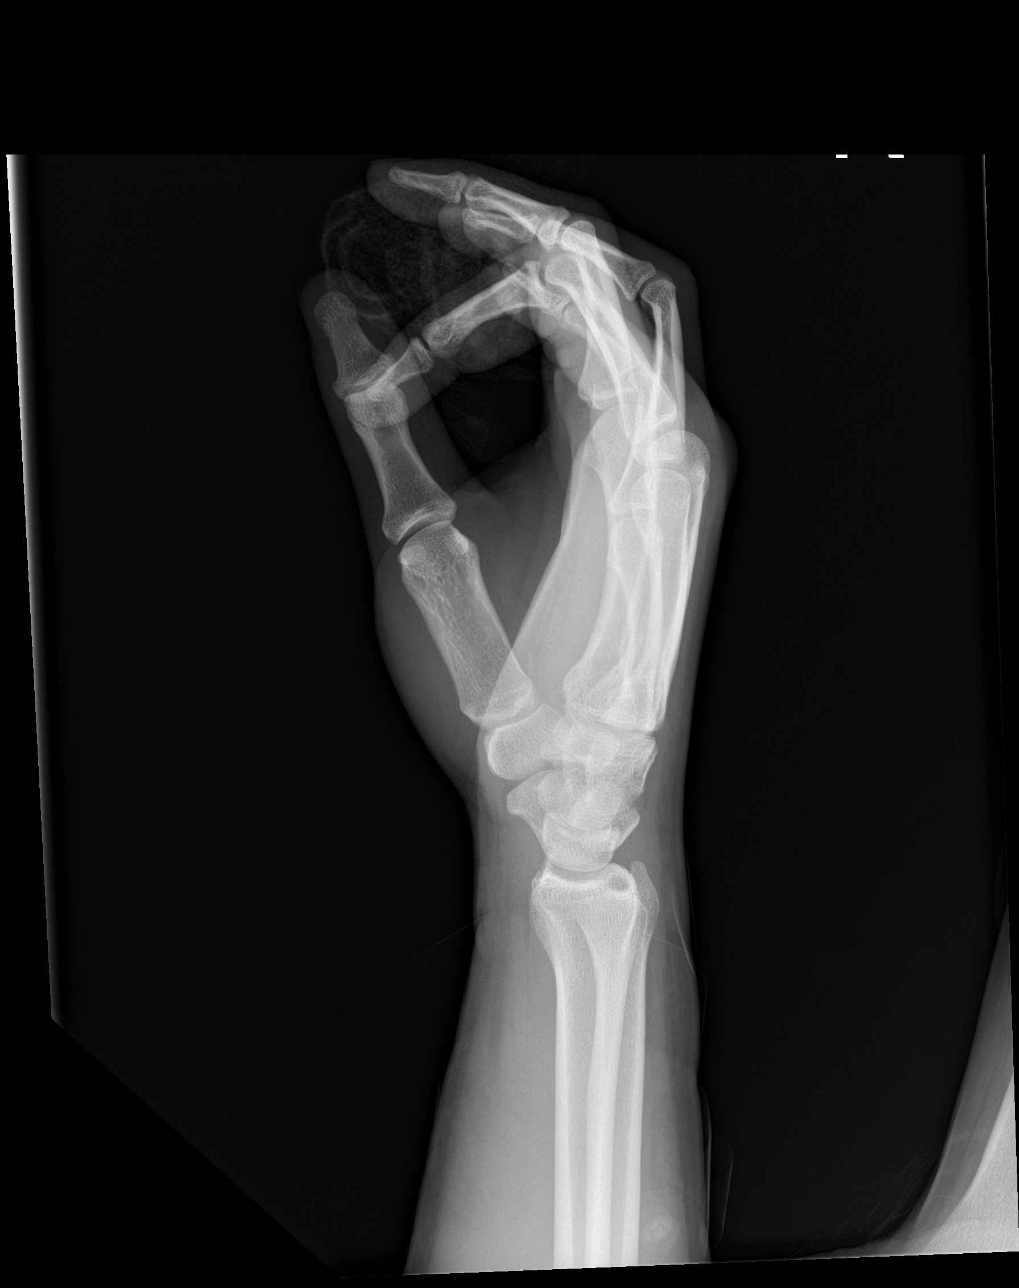

[4 of 4 positions shown; findings below may reference images not displayed]

FINDINGS: The second, third, fourth and fifth right fingers are limited in
evaluation secondary to their flexed position. There is no evidence
of fracture or dislocation. There is no evidence of arthropathy or
other focal bone abnormality. Soft tissues are unremarkable.
IMPRESSION: Limited study, as described above, without an acute osseous
abnormality.
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
## 2021-09-26 IMAGING — DX DG ABD PORTABLE 1V
1 series · 1 of 1 positions shown · non-contrast
Comparison: None.

CLINICAL DATA: Trauma.  OG tube placement.

EXAM:
PORTABLE ABDOMEN - 1 VIEW

[abdomen supine]
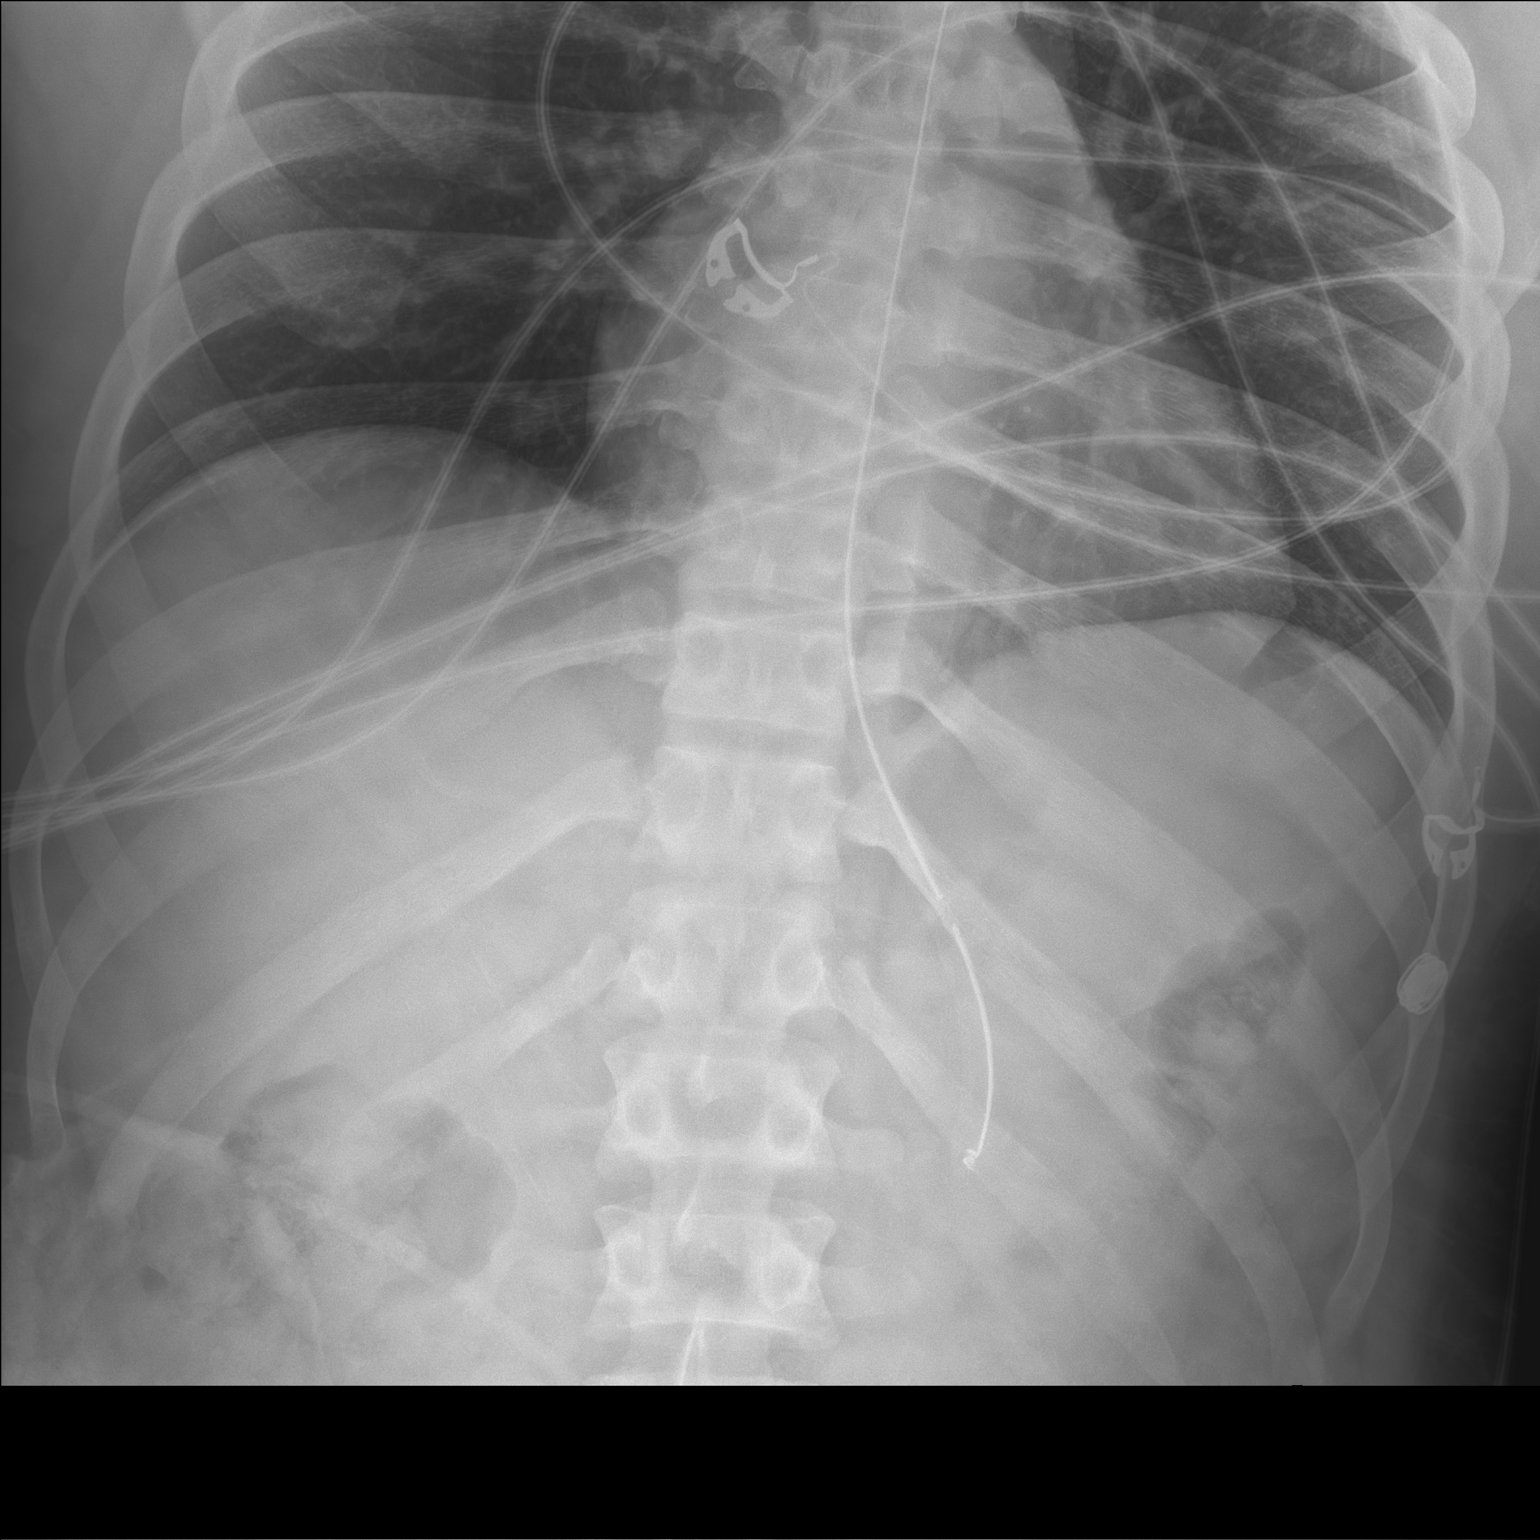

[1 of 1 positions shown; findings below may reference images not displayed]

FINDINGS: Side port of the OG tube is in the proximal stomach. Heart size is
normal. Lung bases are clear.
IMPRESSION: Side port of the OG tube is in the proximal stomach.
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
## 2021-09-26 SURGERY — CRANIOTOMY HEMATOMA EVACUATION SUBDURAL
Anesthesia: General | Laterality: Right

## 2021-09-26 MED ORDER — BACITRACIN ZINC 500 UNIT/GM EX OINT
TOPICAL_OINTMENT | Freq: Two times a day (BID) | CUTANEOUS | Status: DC
  Administered 2021-09-27 – 2021-10-19 (×22): 1 via TOPICAL
  Administered 2021-10-20: 31.5 via TOPICAL
  Administered 2021-10-20 – 2021-10-21 (×2): 1 via TOPICAL
  Administered 2021-10-21 – 2021-10-28 (×15): 31.5 via TOPICAL
  Administered 2021-10-29: 1 via TOPICAL
  Administered 2021-10-29 – 2021-11-07 (×16): 31.5 via TOPICAL
  Administered 2021-11-08: 1 via TOPICAL
  Administered 2021-11-08 – 2021-11-09 (×3): 31.5 via TOPICAL
  Filled 2021-09-26 (×8): qty 0.9
  Filled 2021-09-26: qty 28.4
  Filled 2021-09-26 (×10): qty 0.9

## 2021-09-26 MED ORDER — ROCURONIUM BROMIDE 10 MG/ML (PF) SYRINGE
PREFILLED_SYRINGE | INTRAVENOUS | Status: DC | PRN
  Administered 2021-09-26 (×3): 50 mg via INTRAVENOUS

## 2021-09-26 MED ORDER — ALBUMIN HUMAN 5 % IV SOLN: INTRAVENOUS | Status: DC | PRN

## 2021-09-26 MED ORDER — PIVOT 1.5 CAL PO LIQD
1000.0000 mL | ORAL | Status: DC
  Administered 2021-09-26: 1000 mL

## 2021-09-26 MED ORDER — PHENYLEPHRINE HCL-NACL 20-0.9 MG/250ML-% IV SOLN
INTRAVENOUS | Status: DC | PRN
Start: 2021-09-26 — End: 2021-09-26
  Administered 2021-09-26: 40 ug/min via INTRAVENOUS

## 2021-09-26 MED ORDER — POTASSIUM CHLORIDE 20 MEQ PO PACK
40.0000 meq | PACK | Freq: Once | ORAL | Status: AC
  Administered 2021-09-26: 40 meq
  Filled 2021-09-26: qty 2

## 2021-09-26 MED ORDER — VITAL HIGH PROTEIN PO LIQD
1000.0000 mL | ORAL | Status: AC
  Administered 2021-09-26: 1000 mL

## 2021-09-26 MED ORDER — DEXAMETHASONE SODIUM PHOSPHATE 10 MG/ML IJ SOLN
INTRAMUSCULAR | Status: DC | PRN
  Administered 2021-09-26: 10 mg via INTRAVENOUS

## 2021-09-26 MED ORDER — VITAL HIGH PROTEIN PO LIQD
1000.0000 mL | ORAL | Status: DC
  Administered 2021-09-26: 1000 mL

## 2021-09-26 MED ORDER — IOHEXOL 350 MG/ML SOLN
60.0000 mL | Freq: Once | INTRAVENOUS | Status: AC | PRN
  Administered 2021-09-26: 60 mL via INTRAVENOUS

## 2021-09-26 MED ORDER — 0.9 % SODIUM CHLORIDE (POUR BTL) OPTIME
TOPICAL | Status: DC | PRN
  Administered 2021-09-26: 3000 mL

## 2021-09-26 MED ORDER — PIVOT 1.5 CAL PO LIQD
1000.0000 mL | ORAL | Status: DC
  Administered 2021-09-27 – 2021-10-13 (×18): 1000 mL

## 2021-09-26 MED ORDER — ESMOLOL HCL 100 MG/10ML IV SOLN
INTRAVENOUS | Status: DC | PRN
  Administered 2021-09-26 (×3): 30 mg via INTRAVENOUS

## 2021-09-26 MED ORDER — PANTOPRAZOLE 2 MG/ML SUSPENSION
40.0000 mg | Freq: Every day | ORAL | Status: DC
  Administered 2021-09-27 – 2021-10-16 (×19): 40 mg
  Filled 2021-09-26 (×19): qty 20

## 2021-09-26 MED ORDER — FENTANYL CITRATE (PF) 250 MCG/5ML IJ SOLN
INTRAMUSCULAR | Status: AC
  Filled 2021-09-26: qty 5

## 2021-09-26 MED ORDER — OXYCODONE HCL 5 MG/5ML PO SOLN
5.0000 mg | ORAL | Status: DC | PRN
  Administered 2021-09-27 – 2021-10-03 (×22): 10 mg
  Filled 2021-09-26 (×21): qty 10

## 2021-09-26 MED ORDER — LIDOCAINE-EPINEPHRINE 1 %-1:100000 IJ SOLN
INTRAMUSCULAR | Status: DC | PRN
  Administered 2021-09-26 (×2): 10 mL

## 2021-09-26 MED ORDER — CEFAZOLIN SODIUM-DEXTROSE 2-4 GM/100ML-% IV SOLN
2.0000 g | Freq: Once | INTRAVENOUS | Status: AC
  Administered 2021-09-26: 2 g via INTRAVENOUS
  Filled 2021-09-26: qty 100

## 2021-09-26 MED ORDER — SODIUM CHLORIDE 0.9 % IV SOLN: INTRAVENOUS | Status: DC | PRN

## 2021-09-26 MED ORDER — LACTATED RINGERS IV SOLN
INTRAVENOUS | Status: DC | PRN
Start: 2021-09-26 — End: 2021-09-26

## 2021-09-26 MED ORDER — PROSOURCE TF PO LIQD
45.0000 mL | Freq: Two times a day (BID) | ORAL | Status: DC
  Filled 2021-09-26: qty 45

## 2021-09-26 MED ORDER — FENTANYL CITRATE (PF) 250 MCG/5ML IJ SOLN
INTRAMUSCULAR | Status: DC | PRN
  Administered 2021-09-26: 50 ug via INTRAVENOUS
  Administered 2021-09-26: 100 ug via INTRAVENOUS

## 2021-09-26 MED ORDER — VASOPRESSIN 20 UNIT/ML IV SOLN
INTRAVENOUS | Status: AC
  Filled 2021-09-26: qty 1

## 2021-09-26 MED ORDER — BACITRACIN ZINC 500 UNIT/GM EX OINT
TOPICAL_OINTMENT | CUTANEOUS | Status: DC | PRN
  Administered 2021-09-26: 1 via TOPICAL

## 2021-09-26 MED ORDER — ACETAMINOPHEN 500 MG PO TABS
1000.0000 mg | ORAL_TABLET | Freq: Four times a day (QID) | ORAL | Status: DC
Start: 2021-09-26 — End: 2021-10-18
  Administered 2021-09-26 – 2021-10-18 (×77): 1000 mg
  Filled 2021-09-26 (×81): qty 2

## 2021-09-26 MED ORDER — CEFAZOLIN SODIUM-DEXTROSE 2-4 GM/100ML-% IV SOLN
2.0000 g | Freq: Three times a day (TID) | INTRAVENOUS | Status: DC
  Administered 2021-09-26 – 2021-09-29 (×9): 2 g via INTRAVENOUS
  Filled 2021-09-26 (×9): qty 100

## 2021-09-26 MED ORDER — THROMBIN 20000 UNITS EX SOLR
CUTANEOUS | Status: DC | PRN
  Administered 2021-09-26: 20 mL via TOPICAL

## 2021-09-26 MED ORDER — VITAL HIGH PROTEIN PO LIQD: 1000.0000 mL | ORAL | Status: DC

## 2021-09-26 MED ORDER — VASOPRESSIN 20 UNIT/ML IV SOLN
INTRAVENOUS | Status: DC | PRN
  Administered 2021-09-26: 1 [IU] via INTRAVENOUS

## 2021-09-26 MED ORDER — CHLORHEXIDINE GLUCONATE CLOTH 2 % EX PADS
6.0000 | MEDICATED_PAD | Freq: Every day | CUTANEOUS | Status: DC
  Administered 2021-09-26 – 2021-11-25 (×58): 6 via TOPICAL

## 2021-09-26 MED ORDER — BACITRACIN ZINC 500 UNIT/GM EX OINT
TOPICAL_OINTMENT | CUTANEOUS | Status: AC
  Filled 2021-09-26: qty 28.35

## 2021-09-26 MED ORDER — METHOCARBAMOL 500 MG PO TABS
1000.0000 mg | ORAL_TABLET | Freq: Three times a day (TID) | ORAL | Status: DC
  Administered 2021-09-26 – 2021-10-18 (×65): 1000 mg
  Filled 2021-09-26 (×65): qty 2

## 2021-09-26 MED ORDER — LIDOCAINE-EPINEPHRINE 1 %-1:100000 IJ SOLN
INTRAMUSCULAR | Status: AC
  Filled 2021-09-26: qty 1

## 2021-09-26 MED ORDER — THROMBIN 20000 UNITS EX SOLR
CUTANEOUS | Status: AC
  Filled 2021-09-26: qty 20000

## 2021-09-26 MED ORDER — CEFAZOLIN SODIUM-DEXTROSE 2-3 GM-%(50ML) IV SOLR
INTRAVENOUS | Status: DC | PRN
  Administered 2021-09-26: 2 g via INTRAVENOUS

## 2021-09-26 SURGICAL SUPPLY — 81 items
ADH SKN CLS APL DERMABOND .7 (GAUZE/BANDAGES/DRESSINGS)
ADH SKN CLS LQ APL DERMABOND (GAUZE/BANDAGES/DRESSINGS) ×1
APL SKNCLS STERI-STRIP NONHPOA (GAUZE/BANDAGES/DRESSINGS) ×1
BAG COUNTER SPONGE SURGICOUNT (BAG) ×2 IMPLANT
BAG SPNG CNTER NS LX DISP (BAG) ×1
BENZOIN TINCTURE PRP APPL 2/3 (GAUZE/BANDAGES/DRESSINGS) ×1 IMPLANT
BIT DRILL WIRE PASS 1.3MM (BIT) ×1 IMPLANT
BLADE CLIPPER SURG (BLADE) IMPLANT
BLADE SURG 11 STRL SS (BLADE) ×2 IMPLANT
BNDG CMPR 75X41 PLY HI ABS (GAUZE/BANDAGES/DRESSINGS) ×1
BNDG COHESIVE 4X5 TAN STRL (GAUZE/BANDAGES/DRESSINGS) IMPLANT
BNDG GAUZE ELAST 4 BULKY (GAUZE/BANDAGES/DRESSINGS) ×1 IMPLANT
BNDG STRETCH 4X75 STRL LF (GAUZE/BANDAGES/DRESSINGS) ×1 IMPLANT
BUR ACORN 9.0 PRECISION (BURR) ×2 IMPLANT
BUR SPIRAL ROUTER 2.3 (BUR) ×1 IMPLANT
CANISTER SUCT 3000ML PPV (MISCELLANEOUS) ×3 IMPLANT
CARTRIDGE OIL MAESTRO DRILL (MISCELLANEOUS) ×1 IMPLANT
CLIP RANEY DISP (INSTRUMENTS) ×1 IMPLANT
CLIP VESOCCLUDE MED 6/CT (CLIP) IMPLANT
COVER BACK TABLE 60X90IN (DRAPES) ×2 IMPLANT
DECANTER SPIKE VIAL GLASS SM (MISCELLANEOUS) ×1 IMPLANT
DERMABOND ADHESIVE PROPEN (GAUZE/BANDAGES/DRESSINGS) ×1
DERMABOND ADVANCED (GAUZE/BANDAGES/DRESSINGS)
DERMABOND ADVANCED .7 DNX12 (GAUZE/BANDAGES/DRESSINGS) ×1 IMPLANT
DERMABOND ADVANCED .7 DNX6 (GAUZE/BANDAGES/DRESSINGS) IMPLANT
DIFFUSER DRILL AIR PNEUMATIC (MISCELLANEOUS) ×2 IMPLANT
DRAIN CHANNEL 10M FLAT 3/4 FLT (DRAIN) ×1 IMPLANT
DRAPE INCISE IOBAN 66X45 STRL (DRAPES) ×1 IMPLANT
DRAPE NEUROLOGICAL W/INCISE (DRAPES) ×2 IMPLANT
DRAPE SURG 17X23 STRL (DRAPES) IMPLANT
DRAPE WARM FLUID 44X44 (DRAPES) ×2 IMPLANT
DRILL WIRE PASS 1.3MM (BIT)
DRSG OPSITE 4X5.5 SM (GAUZE/BANDAGES/DRESSINGS) ×3 IMPLANT
DRSG OPSITE POSTOP 4X8 (GAUZE/BANDAGES/DRESSINGS) ×1 IMPLANT
ELECT REM PT RETURN 9FT ADLT (ELECTROSURGICAL) ×2
ELECTRODE REM PT RTRN 9FT ADLT (ELECTROSURGICAL) ×1 IMPLANT
EVACUATOR SILICONE 100CC (DRAIN) ×1 IMPLANT
GAUZE 4X4 16PLY ~~LOC~~+RFID DBL (SPONGE) ×3 IMPLANT
GAUZE SPONGE 4X4 12PLY STRL (GAUZE/BANDAGES/DRESSINGS) ×1 IMPLANT
GLOVE EXAM NITRILE XL STR (GLOVE) IMPLANT
GLOVE SURG ENC MOIS LTX SZ7 (GLOVE) IMPLANT
GLOVE SURG ENC MOIS LTX SZ8 (GLOVE) ×2 IMPLANT
GLOVE SURG UNDER POLY LF SZ7 (GLOVE) IMPLANT
GLOVE SURG UNDER POLY LF SZ8.5 (GLOVE) ×2 IMPLANT
GOWN STRL REUS W/ TWL LRG LVL3 (GOWN DISPOSABLE) ×1 IMPLANT
GOWN STRL REUS W/ TWL XL LVL3 (GOWN DISPOSABLE) ×1 IMPLANT
GOWN STRL REUS W/TWL 2XL LVL3 (GOWN DISPOSABLE) IMPLANT
GOWN STRL REUS W/TWL LRG LVL3 (GOWN DISPOSABLE) ×2
GOWN STRL REUS W/TWL XL LVL3 (GOWN DISPOSABLE) ×2
GRAFT DURAGEN MATRIX 3WX3L (Graft) ×2 IMPLANT
GRAFT DURAGEN MATRIX 3X3 SNGL (Graft) IMPLANT
HEMOSTAT SURGICEL 2X14 (HEMOSTASIS) IMPLANT
KIT BASIN OR (CUSTOM PROCEDURE TRAY) ×2 IMPLANT
KIT TURNOVER KIT B (KITS) ×2 IMPLANT
NDL HYPO 25X1 1.5 SAFETY (NEEDLE) ×1 IMPLANT
NEEDLE HYPO 25X1 1.5 SAFETY (NEEDLE) ×2 IMPLANT
NS IRRIG 1000ML POUR BTL (IV SOLUTION) ×4 IMPLANT
OIL CARTRIDGE MAESTRO DRILL (MISCELLANEOUS) ×2
PACK CRANIOTOMY CUSTOM (CUSTOM PROCEDURE TRAY) ×2 IMPLANT
PAD ARMBOARD 7.5X6 YLW CONV (MISCELLANEOUS) ×2 IMPLANT
PATTIES SURGICAL .25X.25 (GAUZE/BANDAGES/DRESSINGS) IMPLANT
PATTIES SURGICAL .5 X.5 (GAUZE/BANDAGES/DRESSINGS) IMPLANT
PATTIES SURGICAL .5 X3 (DISPOSABLE) IMPLANT
PATTIES SURGICAL 1X1 (DISPOSABLE) IMPLANT
PIN MAYFIELD SKULL DISP (PIN) IMPLANT
SPONGE NEURO XRAY DETECT 1X3 (DISPOSABLE) IMPLANT
SPONGE SURGIFOAM ABS GEL 100 (HEMOSTASIS) IMPLANT
STAPLER SKIN PROX WIDE 3.9 (STAPLE) ×1 IMPLANT
STAPLER VISISTAT 35W (STAPLE) ×2 IMPLANT
STRIP CLOSURE SKIN 1/2X4 (GAUZE/BANDAGES/DRESSINGS) ×1 IMPLANT
SUT ETHILON 3 0 FSL (SUTURE) IMPLANT
SUT MNCRL AB 4-0 PS2 18 (SUTURE) ×1 IMPLANT
SUT NURALON 4 0 TR CR/8 (SUTURE) ×3 IMPLANT
SUT VIC AB 2-0 CT1 18 (SUTURE) ×6 IMPLANT
SUT VIC AB 4-0 PS2 27 (SUTURE) ×1 IMPLANT
SYR CONTROL 10ML LL (SYRINGE) ×2 IMPLANT
TOWEL GREEN STERILE (TOWEL DISPOSABLE) ×3 IMPLANT
TOWEL GREEN STERILE FF (TOWEL DISPOSABLE) ×2 IMPLANT
TRAY FOLEY MTR SLVR 16FR STAT (SET/KITS/TRAYS/PACK) ×1 IMPLANT
UNDERPAD 30X36 HEAVY ABSORB (UNDERPADS AND DIAPERS) ×1 IMPLANT
WATER STERILE IRR 1000ML POUR (IV SOLUTION) ×2 IMPLANT

## 2021-09-26 NOTE — Transfer of Care (Signed)
Immediate Anesthesia Transfer of Care Note ? ?Patient: Sean Bruce ? ?Procedure(s) Performed: RIGHT CRANIOTOMY HEMATOMA EVACUATION SUBDURAL; IMPLANTATION OF BONE FLAP INTO ABDOMINAL WALL (Right) ? ?Patient Location: ICU ? ?Anesthesia Type:General ? ?Level of Consciousness: unresponsive and Patient remains intubated per anesthesia plan ? ?Airway & Oxygen Therapy: Patient remains intubated per anesthesia plan and Patient placed on Ventilator (see vital sign flow sheet for setting) ? ?Post-op Assessment: Report given to RN and Post -op Vital signs reviewed and stable ? ?Post vital signs: Reviewed and stable ? ?Last Vitals:  ?Vitals Value Taken Time  ?BP 110/72   ?Temp    ?Pulse 89 09/26/21 1040  ?Resp 15 09/26/21 1040  ?SpO2 100 % 09/26/21 1040  ?Vitals shown include unvalidated device data. ? ?Last Pain:  ?Vitals:  ? 09/26/21 0400  ?TempSrc: Axillary  ?   ? ?  ? ?Complications: No notable events documented. ?

## 2021-09-26 NOTE — Progress Notes (Signed)
Pt transported from 4N21 to CT and back with no complications.  

## 2021-09-26 NOTE — Anesthesia Procedure Notes (Signed)
Arterial Line Insertion ?Start/End3/30/2023 8:17 AM ?Performed by: Garfield Cornea, CRNA, CRNA ? Patient location: OR. ?Preanesthetic checklist: patient identified, IV checked, site marked, risks and benefits discussed, surgical consent, monitors and equipment checked, pre-op evaluation, timeout performed and anesthesia consent ?Lidocaine 1% used for infiltration ?Left, radial was placed ?Catheter size: 20 G ?Hand hygiene performed  and maximum sterile barriers used  ? ?Attempts: 1 ?Procedure performed without using ultrasound guided technique. ?Following insertion, dressing applied and Biopatch. ?Post procedure assessment: normal and unchanged ? ?Patient tolerated the procedure well with no immediate complications. ? ? ?

## 2021-09-26 NOTE — Anesthesia Postprocedure Evaluation (Signed)
Anesthesia Post Note ? ?Patient: Sean Bruce ? ?Procedure(s) Performed: RIGHT CRANIOTOMY HEMATOMA EVACUATION SUBDURAL; IMPLANTATION OF BONE FLAP INTO ABDOMINAL WALL (Right) ? ?  ? ?Patient location during evaluation: ICU ?Anesthesia Type: General ?Level of consciousness: patient remains intubated per anesthesia plan ?Pain management: pain level controlled ?Vital Signs Assessment: post-procedure vital signs reviewed and stable ?Respiratory status: patient on ventilator - see flowsheet for VS ?Cardiovascular status: stable ?Postop Assessment: no apparent nausea or vomiting ?Anesthetic complications: no ? ? ?No notable events documented. ? ?Last Vitals:  ?Vitals:  ? 09/26/21 1300 09/26/21 1400  ?BP: 118/68 122/74  ?Pulse: 79 80  ?Resp: 18 16  ?Temp:    ?SpO2: 100% 97%  ?  ?Last Pain:  ?Vitals:  ? 09/26/21 1200  ?TempSrc: Axillary  ? ? ?  ?  ?  ?  ?  ?  ? ?Caren Macadam ? ? ? ? ?
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
## 2021-09-26 NOTE — Progress Notes (Signed)
? ? ? ?  Referral received for Sean Bruce for goals of care discussion. Chart reviewed and updates received from RN. Patient assessed and is unable to engage appropriately in discussions.  ? ?Patients two aunts are at bedside. Patient's Mom, Dad, and brother just landed in [place] (from [place]) and are expected to arrive today late afternoon/evening. ? ?PMT will re-attempt initial consult after primary relatives arrive and are able to spend time with him, likely tomorrow. ? ?Thank you for your referral and allowing PMT to assist in [name]'s care.  ? ?Wynne Dust, NP ?Palliative Medicine Team ?Phone: [phone] ? ?NO CHARGE  ? ?

## 2021-09-26 NOTE — Care Management (Signed)
?  Transition of Care (TOC) Screening Note ? ? ?Patient Details  ?Name: Sean Bruce ?Date of Birth: 18-Oct-1995 ? ? ?Transition of Care [facility]) CM/SW Contact:    ?Ella Bodo, RN ?Phone Number: ?09/26/2021, 4:14 PM ? ? ? ?Transition of Care Department [facility]) has reviewed patient and no TOC needs have been identified at this time. We will continue to monitor patient advancement through interdisciplinary progression rounds. If new patient transition needs arise, please place a TOC consult. ? ?Reinaldo Raddle, RN, BSN  ?Trauma/Neuro ICU Case Manager ?[phone] ? ?

## 2021-09-26 NOTE — Progress Notes (Signed)
Chaplain responded to Level 1.  No family present. Chaplain seeking to try to identify family to let them know patient is here. ?Rev.Vermont Tremaine Fuhriman ?Pager [phone] ?
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
## 2021-09-26 NOTE — Anesthesia Preprocedure Evaluation (Signed)
Anesthesia Evaluation  ?Patient identified by MRN, date of birth, ID band ?Patient unresponsive ? ? ? ?Reviewed: ?Allergy & Precautions, NPO status , Patient's Chart, lab work & pertinent test results, Unable to perform ROS - Chart review onlyPreop documentation limited or incomplete due to emergent nature of procedure. ? ?Airway ?Mallampati: Intubated ? ? ? ? ? ? Dental ?  ?Pulmonary ? ?  ? ? ? ?+ intubated ? ? ? Cardiovascular ? ?Rhythm:Regular Rate:Tachycardia ? ? ?  ?Neuro/Psych ?  ? GI/Hepatic ?  ?Endo/Other  ? ? Renal/GU ?  ? ?  ?Musculoskeletal ? ? Abdominal ?  ?Peds ? Hematology ?  ?Anesthesia Other Findings ? ? Reproductive/Obstetrics ? ?  ? ? ? ? ? ? ? ? ? ? ? ? ? ?  ?  ? ? ? ? ? ? ? ? ?Anesthesia Physical ?Anesthesia Plan ? ?ASA: 4 and emergent ? ?Anesthesia Plan: General  ? ?Post-op Pain Management:   ? ?Induction:  ? ?PONV Risk Score and Plan: 3 ? ?Airway Management Planned: Oral ETT ? ?Additional Equipment: None ? ?Intra-op Plan:  ? ?Post-operative Plan: Post-operative intubation/ventilation ? ?Informed Consent: I have reviewed the patients History and Physical, chart, labs and discussed the procedure including the risks, benefits and alternatives for the proposed anesthesia with the patient or authorized representative who has indicated his/her understanding and acceptance.  ? ? ? ? ? ?Plan Discussed with: CRNA ? ?Anesthesia Plan Comments:   ? ? ? ? ? ? ?Anesthesia Quick Evaluation ? ?

## 2021-09-26 NOTE — Progress Notes (Signed)
?   09/26/21 1950  ?Clinical Encounter Type  ?Visited With Family;Patient not available  ?Visit Type Initial;Social support;Spiritual support  ?Referral From Chaplain  ?Consult/Referral To Chaplain  ? ?Chaplain visited with family, mother had recently arrived and was focused on her son. Other family member I believe was an aunt of the patient. She shared some of Cato's life experience. Family was appreciative of our time together but I sensed that the mother wanted to focus her energy on her son as I well understood.  ? ?Valerie Roys ?Chaplain Resident  ?[facility]  ?[phone] ?
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
## 2021-09-27 ENCOUNTER — Inpatient Hospital Stay (HOSPITAL_COMMUNITY): Payer: Medicaid Other

## 2021-09-27 ENCOUNTER — Encounter (HOSPITAL_COMMUNITY): Payer: Self-pay | Admitting: Neurosurgery

## 2021-09-27 DIAGNOSIS — S069XAA Unspecified intracranial injury with loss of consciousness status unknown, initial encounter: Secondary | ICD-10-CM

## 2021-09-27 LAB — CBC
HCT: 27.6 % — ABNORMAL LOW (ref 39.0–52.0)
Hemoglobin: 8.9 g/dL — ABNORMAL LOW (ref 13.0–17.0)
MCH: 31 pg (ref 26.0–34.0)
MCHC: 32.2 g/dL (ref 30.0–36.0)
MCV: 96.2 fL (ref 80.0–100.0)
Platelets: 116 10*3/uL — ABNORMAL LOW (ref 150–400)
RBC: 2.87 MIL/uL — ABNORMAL LOW (ref 4.22–5.81)
RDW: 12.5 % (ref 11.5–15.5)
WBC: 13.9 10*3/uL — ABNORMAL HIGH (ref 4.0–10.5)
nRBC: 0 % (ref 0.0–0.2)

## 2021-09-27 LAB — GLUCOSE, CAPILLARY
Glucose-Capillary: 126 mg/dL — ABNORMAL HIGH (ref 70–99)
Glucose-Capillary: 141 mg/dL — ABNORMAL HIGH (ref 70–99)
Glucose-Capillary: 182 mg/dL — ABNORMAL HIGH (ref 70–99)
Glucose-Capillary: 159 mg/dL — ABNORMAL HIGH (ref 70–99)
Glucose-Capillary: 186 mg/dL — ABNORMAL HIGH (ref 70–99)
Glucose-Capillary: 128 mg/dL — ABNORMAL HIGH (ref 70–99)

## 2021-09-27 LAB — BASIC METABOLIC PANEL
Anion gap: 6 (ref 5–15)
BUN: 12 mg/dL (ref 6–20)
CO2: 22 mmol/L (ref 22–32)
Calcium: 8 mg/dL — ABNORMAL LOW (ref 8.9–10.3)
Chloride: 114 mmol/L — ABNORMAL HIGH (ref 98–111)
Creatinine, Ser: 1.23 mg/dL (ref 0.61–1.24)
GFR, Estimated: >60 mL/min (ref >60)
Glucose, Bld: 175 mg/dL — ABNORMAL HIGH (ref 70–99)
Potassium: 3.7 mmol/L (ref 3.5–5.1)
Sodium: 142 mmol/L (ref 135–145)

## 2021-09-27 LAB — HEMOGLOBIN A1C
Hgb A1c MFr Bld: 5.2 % (ref 4.8–5.6)
Mean Plasma Glucose: 102.54 mg/dL

## 2021-09-27 IMAGING — DX DG ABD PORTABLE 1V
1 series · 1 of 1 positions shown · non-contrast
Comparison: Abdominal radiograph [DATE]

CLINICAL DATA: Feeding tube placement

EXAM:
PORTABLE ABDOMEN - 1 VIEW

[abdomen]
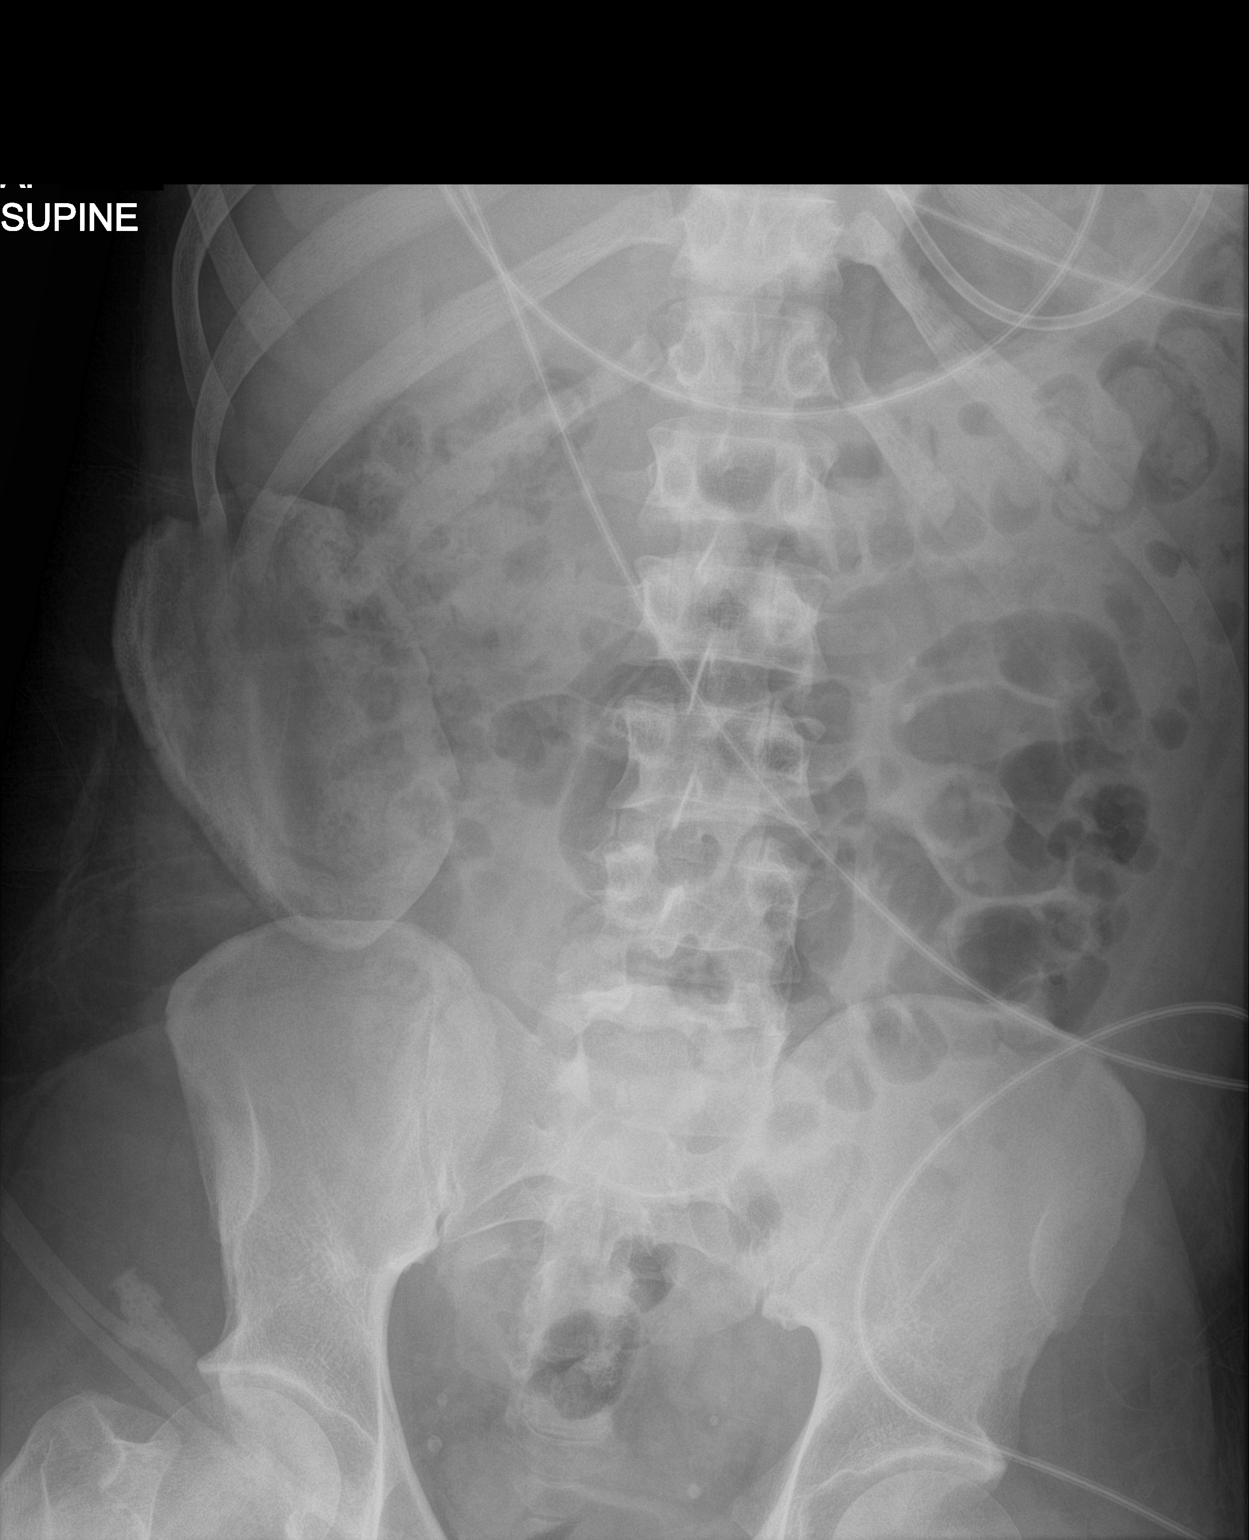

[1 of 1 positions shown; findings below may reference images not displayed]

FINDINGS: Withdrawal of the NG tube and placement feeding tube. Tip of the
feeding tube is in the gastric fundus.

No dilated large or small bowel.

Presumed external artifact over the RIGHT flank measuring 13 cm x 8
cm
IMPRESSION: Feeding tube with tip in the gastric fundus.
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
## 2021-09-27 IMAGING — DX DG CHEST 1V PORT
1 series · 1 of 1 positions shown · non-contrast
Comparison: Chest radiograph dated [DATE] and CT dated
[DATE]

CLINICAL DATA: Respiratory failure.

EXAM:
PORTABLE CHEST 1 VIEW

[chest ap]
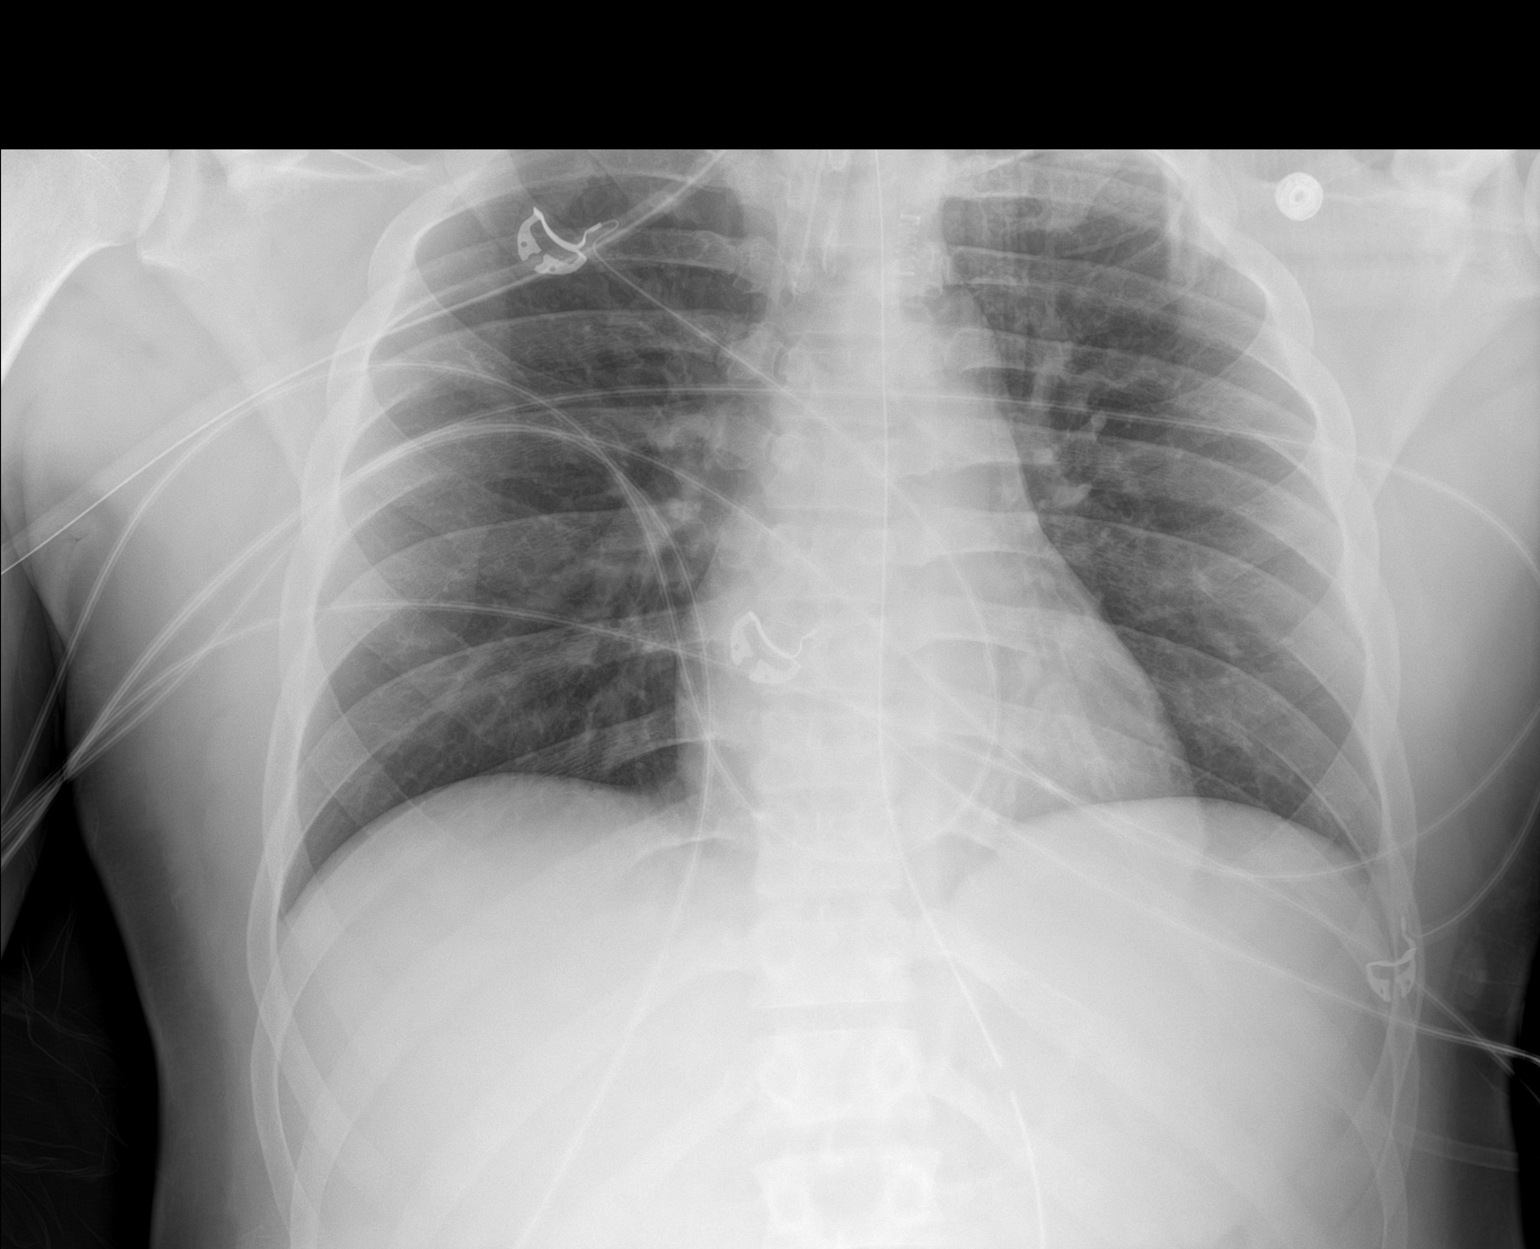

[1 of 1 positions shown; findings below may reference images not displayed]

FINDINGS: Endotracheal tube approximately 2.5 cm above the carina. Enteric
tube extends below the diaphragm with side port distal to the GE
junction and tip beyond the inferior margin of the image. No focal
consolidation, pleural effusion, pneumothorax. The cardiac
silhouette is within limits. No acute osseous pathology.
IMPRESSION: 1. No active cardiopulmonary disease.
2. Endotracheal tube above the carina.

## 2021-09-27 MED ORDER — DOCUSATE SODIUM 50 MG/5ML PO LIQD
100.0000 mg | Freq: Two times a day (BID) | ORAL | Status: DC
  Administered 2021-09-27 – 2021-11-25 (×110): 100 mg
  Filled 2021-09-27 (×110): qty 10

## 2021-09-27 MED ORDER — ONDANSETRON HCL 4 MG PO TABS: 4.0000 mg | ORAL_TABLET | Freq: Four times a day (QID) | ORAL | Status: DC | PRN

## 2021-09-27 MED ORDER — INSULIN ASPART 100 UNIT/ML IJ SOLN
0.0000 [IU] | INTRAMUSCULAR | Status: DC
  Administered 2021-09-27 (×2): 3 [IU] via SUBCUTANEOUS
  Administered 2021-09-27: 2 [IU] via SUBCUTANEOUS
  Administered 2021-09-27 – 2021-09-28 (×3): 3 [IU] via SUBCUTANEOUS
  Administered 2021-09-28: 5 [IU] via SUBCUTANEOUS
  Administered 2021-09-28 – 2021-09-29 (×3): 2 [IU] via SUBCUTANEOUS
  Administered 2021-09-29 (×4): 3 [IU] via SUBCUTANEOUS
  Administered 2021-09-29 – 2021-09-30 (×3): 2 [IU] via SUBCUTANEOUS
  Administered 2021-09-30: 3 [IU] via SUBCUTANEOUS
  Administered 2021-09-30 (×2): 2 [IU] via SUBCUTANEOUS
  Administered 2021-09-30 – 2021-10-01 (×3): 3 [IU] via SUBCUTANEOUS
  Administered 2021-10-01: 2 [IU] via SUBCUTANEOUS
  Administered 2021-10-01: 3 [IU] via SUBCUTANEOUS
  Administered 2021-10-01: 2 [IU] via SUBCUTANEOUS
  Administered 2021-10-02 (×2): 3 [IU] via SUBCUTANEOUS
  Administered 2021-10-02: 2 [IU] via SUBCUTANEOUS
  Administered 2021-10-02 (×2): 3 [IU] via SUBCUTANEOUS
  Administered 2021-10-02: 5 [IU] via SUBCUTANEOUS
  Administered 2021-10-03: 3 [IU] via SUBCUTANEOUS
  Administered 2021-10-03: 5 [IU] via SUBCUTANEOUS
  Administered 2021-10-03 (×3): 3 [IU] via SUBCUTANEOUS
  Administered 2021-10-03: 2 [IU] via SUBCUTANEOUS
  Administered 2021-10-04: 11 [IU] via SUBCUTANEOUS
  Administered 2021-10-04: 5 [IU] via SUBCUTANEOUS
  Administered 2021-10-04: 8 [IU] via SUBCUTANEOUS
  Administered 2021-10-04: 5 [IU] via SUBCUTANEOUS
  Administered 2021-10-04: 3 [IU] via SUBCUTANEOUS
  Administered 2021-10-04 – 2021-10-05 (×5): 5 [IU] via SUBCUTANEOUS
  Administered 2021-10-05 (×2): 3 [IU] via SUBCUTANEOUS
  Administered 2021-10-06: 5 [IU] via SUBCUTANEOUS
  Administered 2021-10-06: 2 [IU] via SUBCUTANEOUS
  Administered 2021-10-06 (×2): 3 [IU] via SUBCUTANEOUS
  Administered 2021-10-06: 2 [IU] via SUBCUTANEOUS
  Administered 2021-10-06 – 2021-10-07 (×7): 5 [IU] via SUBCUTANEOUS
  Administered 2021-10-08 (×2): 3 [IU] via SUBCUTANEOUS
  Administered 2021-10-08: 5 [IU] via SUBCUTANEOUS
  Administered 2021-10-08: 3 [IU] via SUBCUTANEOUS
  Administered 2021-10-08 (×2): 5 [IU] via SUBCUTANEOUS
  Administered 2021-10-09 – 2021-10-10 (×8): 3 [IU] via SUBCUTANEOUS
  Administered 2021-10-10 (×2): 2 [IU] via SUBCUTANEOUS
  Administered 2021-10-10 (×2): 3 [IU] via SUBCUTANEOUS
  Administered 2021-10-10 – 2021-10-11 (×4): 2 [IU] via SUBCUTANEOUS
  Administered 2021-10-12: 3 [IU] via SUBCUTANEOUS
  Administered 2021-10-12: 2 [IU] via SUBCUTANEOUS
  Administered 2021-10-12: 3 [IU] via SUBCUTANEOUS
  Administered 2021-10-12 – 2021-10-13 (×7): 2 [IU] via SUBCUTANEOUS
  Administered 2021-10-13: 3 [IU] via SUBCUTANEOUS
  Administered 2021-10-13 – 2021-10-16 (×12): 2 [IU] via SUBCUTANEOUS
  Administered 2021-10-16: 3 [IU] via SUBCUTANEOUS
  Administered 2021-10-16: 2 [IU] via SUBCUTANEOUS
  Administered 2021-10-16: 3 [IU] via SUBCUTANEOUS
  Administered 2021-10-17 (×3): 2 [IU] via SUBCUTANEOUS
  Administered 2021-10-17: 3 [IU] via SUBCUTANEOUS
  Administered 2021-10-18 (×5): 2 [IU] via SUBCUTANEOUS
  Administered 2021-10-18: 3 [IU] via SUBCUTANEOUS
  Administered 2021-10-19 (×6): 2 [IU] via SUBCUTANEOUS
  Administered 2021-10-20: 3 [IU] via SUBCUTANEOUS
  Administered 2021-10-20: 2 [IU] via SUBCUTANEOUS
  Administered 2021-10-20: 3 [IU] via SUBCUTANEOUS
  Administered 2021-10-20 – 2021-10-23 (×14): 2 [IU] via SUBCUTANEOUS
  Administered 2021-10-23: 3 [IU] via SUBCUTANEOUS
  Administered 2021-10-24: 2 [IU] via SUBCUTANEOUS
  Administered 2021-10-24: 3 [IU] via SUBCUTANEOUS
  Administered 2021-10-24: 2 [IU] via SUBCUTANEOUS
  Administered 2021-10-24 (×3): 3 [IU] via SUBCUTANEOUS
  Administered 2021-10-25 (×2): 2 [IU] via SUBCUTANEOUS
  Administered 2021-10-25: 3 [IU] via SUBCUTANEOUS
  Administered 2021-10-25 – 2021-10-31 (×19): 2 [IU] via SUBCUTANEOUS
  Administered 2021-10-31: 3 [IU] via SUBCUTANEOUS
  Administered 2021-10-31 – 2021-11-01 (×3): 2 [IU] via SUBCUTANEOUS
  Administered 2021-11-01 (×2): 3 [IU] via SUBCUTANEOUS
  Administered 2021-11-02 – 2021-11-03 (×9): 2 [IU] via SUBCUTANEOUS
  Administered 2021-11-04 (×2): 3 [IU] via SUBCUTANEOUS
  Administered 2021-11-04: 2 [IU] via SUBCUTANEOUS
  Administered 2021-11-04: 3 [IU] via SUBCUTANEOUS
  Administered 2021-11-04 – 2021-11-05 (×5): 2 [IU] via SUBCUTANEOUS
  Administered 2021-11-05: 3 [IU] via SUBCUTANEOUS
  Administered 2021-11-05 – 2021-11-08 (×14): 2 [IU] via SUBCUTANEOUS
  Administered 2021-11-08: 5 [IU] via SUBCUTANEOUS
  Administered 2021-11-09: 3 [IU] via SUBCUTANEOUS
  Administered 2021-11-09 (×4): 2 [IU] via SUBCUTANEOUS
  Administered 2021-11-09 (×2): 3 [IU] via SUBCUTANEOUS
  Administered 2021-11-10 (×2): 2 [IU] via SUBCUTANEOUS
  Administered 2021-11-10: 3 [IU] via SUBCUTANEOUS
  Administered 2021-11-10: 2 [IU] via SUBCUTANEOUS
  Administered 2021-11-10: 3 [IU] via SUBCUTANEOUS
  Administered 2021-11-11 (×6): 2 [IU] via SUBCUTANEOUS
  Administered 2021-11-12: 3 [IU] via SUBCUTANEOUS
  Administered 2021-11-12 – 2021-11-13 (×9): 2 [IU] via SUBCUTANEOUS
  Administered 2021-11-13 – 2021-11-14 (×2): 3 [IU] via SUBCUTANEOUS
  Administered 2021-11-14 – 2021-11-15 (×5): 2 [IU] via SUBCUTANEOUS
  Administered 2021-11-15: 3 [IU] via SUBCUTANEOUS
  Administered 2021-11-16 – 2021-11-17 (×6): 2 [IU] via SUBCUTANEOUS
  Administered 2021-11-17: 3 [IU] via SUBCUTANEOUS
  Administered 2021-11-17: 2 [IU] via SUBCUTANEOUS
  Administered 2021-11-17: 3 [IU] via SUBCUTANEOUS
  Administered 2021-11-18 (×4): 2 [IU] via SUBCUTANEOUS
  Administered 2021-11-18: 3 [IU] via SUBCUTANEOUS
  Administered 2021-11-19 (×6): 2 [IU] via SUBCUTANEOUS
  Administered 2021-11-20: 3 [IU] via SUBCUTANEOUS
  Administered 2021-11-20 (×5): 2 [IU] via SUBCUTANEOUS
  Administered 2021-11-21 (×3): 3 [IU] via SUBCUTANEOUS
  Administered 2021-11-21 – 2021-11-22 (×2): 2 [IU] via SUBCUTANEOUS
  Administered 2021-11-22: 3 [IU] via SUBCUTANEOUS
  Administered 2021-11-22: 2 [IU] via SUBCUTANEOUS
  Administered 2021-11-22: 3 [IU] via SUBCUTANEOUS
  Administered 2021-11-23 – 2021-11-24 (×5): 2 [IU] via SUBCUTANEOUS
  Administered 2021-11-24: 3 [IU] via SUBCUTANEOUS
  Administered 2021-11-25 (×3): 2 [IU] via SUBCUTANEOUS

## 2021-09-27 MED ORDER — ASPIRIN 325 MG PO TABS
325.0000 mg | ORAL_TABLET | Freq: Every day | ORAL | Status: DC
  Administered 2021-09-27 – 2021-11-25 (×59): 325 mg
  Filled 2021-09-27 (×59): qty 1

## 2021-09-27 MED ORDER — POTASSIUM CHLORIDE 20 MEQ PO PACK
40.0000 meq | PACK | Freq: Once | ORAL | Status: AC
  Administered 2021-09-27: 40 meq
  Filled 2021-09-27: qty 2

## 2021-09-27 MED ORDER — ONDANSETRON HCL 4 MG/2ML IJ SOLN
4.0000 mg | Freq: Four times a day (QID) | INTRAMUSCULAR | Status: DC | PRN
  Filled 2021-09-27: qty 2

## 2021-09-27 NOTE — Procedures (Signed)
Cortrak ? ?Person Inserting Tube:  Steadman Prosperi E, RD ?Tube Type:  Cortrak - 43 inches ?Tube Size:  10 ?Tube Location:  Left nare ?Secured by: Bridle ?Technique Used to Measure Tube Placement:  Marking at nare/corner of mouth ?Cortrak Secured At:  61 cm ? ?Cortrak Tube Team Note: ? ?Consult received to place a Cortrak feeding tube.  ? ?X-ray is required, abdominal x-ray has been ordered by the Cortrak team. Please confirm tube placement before using the Cortrak tube.  ? ?If the tube becomes dislodged please keep the tube and contact the Cortrak team at [email or web address] (password [email or web address]) for replacement.  ?If after hours and replacement cannot be delayed, place a NG tube and confirm placement with an abdominal x-ray.  ? ? ? ?Oceana Walthall A., MS, RD, LDN (she/her/hers) ?RD pager number and weekend/on-call pager number located in Amion. ? ? ?

## 2021-09-27 NOTE — Progress Notes (Signed)
Pt transported to CT with x2 RN at bedside. No complications. RT will continue to monitor.  ?
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
## 2021-09-28 DIAGNOSIS — R4589 Other symptoms and signs involving emotional state: Secondary | ICD-10-CM

## 2021-09-28 LAB — BASIC METABOLIC PANEL
Anion gap: 5 (ref 5–15)
BUN: 11 mg/dL (ref 6–20)
CO2: 23 mmol/L (ref 22–32)
Calcium: 8.2 mg/dL — ABNORMAL LOW (ref 8.9–10.3)
Chloride: 117 mmol/L — ABNORMAL HIGH (ref 98–111)
Creatinine, Ser: 1.04 mg/dL (ref 0.61–1.24)
GFR, Estimated: >60 mL/min (ref >60)
Glucose, Bld: 169 mg/dL — ABNORMAL HIGH (ref 70–99)
Potassium: 3.4 mmol/L — ABNORMAL LOW (ref 3.5–5.1)
Sodium: 145 mmol/L (ref 135–145)

## 2021-09-28 LAB — POCT I-STAT 7, (LYTES, BLD GAS, ICA,H+H)
Acid-base deficit: 5 mmol/L — ABNORMAL HIGH (ref 0.0–2.0)
Bicarbonate: 22 mmol/L (ref 20.0–28.0)
Calcium, Ion: 1.23 mmol/L (ref 1.15–1.40)
HCT: 43 % (ref 39.0–52.0)
Hemoglobin: 14.6 g/dL (ref 13.0–17.0)
O2 Saturation: 100 %
Potassium: 4.2 mmol/L (ref 3.5–5.1)
Sodium: 146 mmol/L — ABNORMAL HIGH (ref 135–145)
TCO2: 23 mmol/L (ref 22–32)
pCO2 arterial: 48.1 mmHg — ABNORMAL HIGH (ref 32–48)
pH, Arterial: 7.268 — ABNORMAL LOW (ref 7.35–7.45)
pO2, Arterial: 447 mmHg — ABNORMAL HIGH (ref 83–108)

## 2021-09-28 LAB — GLUCOSE, CAPILLARY
Glucose-Capillary: 124 mg/dL — ABNORMAL HIGH (ref 70–99)
Glucose-Capillary: 139 mg/dL — ABNORMAL HIGH (ref 70–99)
Glucose-Capillary: 145 mg/dL — ABNORMAL HIGH (ref 70–99)
Glucose-Capillary: 164 mg/dL — ABNORMAL HIGH (ref 70–99)
Glucose-Capillary: 175 mg/dL — ABNORMAL HIGH (ref 70–99)
Glucose-Capillary: 248 mg/dL — ABNORMAL HIGH (ref 70–99)

## 2021-09-28 LAB — CBC
HCT: 22.2 % — ABNORMAL LOW (ref 39.0–52.0)
Hemoglobin: 7.7 g/dL — ABNORMAL LOW (ref 13.0–17.0)
MCH: 32.4 pg (ref 26.0–34.0)
MCHC: 34.7 g/dL (ref 30.0–36.0)
MCV: 93.3 fL (ref 80.0–100.0)
Platelets: 100 10*3/uL — ABNORMAL LOW (ref 150–400)
RBC: 2.38 MIL/uL — ABNORMAL LOW (ref 4.22–5.81)
RDW: 12.6 % (ref 11.5–15.5)
WBC: 11.4 10*3/uL — ABNORMAL HIGH (ref 4.0–10.5)
nRBC: 0 % (ref 0.0–0.2)

## 2021-09-28 MED ORDER — POTASSIUM CHLORIDE 20 MEQ PO PACK
40.0000 meq | PACK | Freq: Once | ORAL | Status: AC
  Administered 2021-09-28: 40 meq
  Filled 2021-09-28: qty 2

## 2021-09-28 NOTE — Progress Notes (Signed)
Verbal order from Dr. Bedelia Person to d/c c-collar.  ?
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
## 2021-09-29 DIAGNOSIS — Z7189 Other specified counseling: Secondary | ICD-10-CM

## 2021-09-29 LAB — GLUCOSE, CAPILLARY
Glucose-Capillary: 177 mg/dL — ABNORMAL HIGH (ref 70–99)
Glucose-Capillary: 147 mg/dL — ABNORMAL HIGH (ref 70–99)
Glucose-Capillary: 182 mg/dL — ABNORMAL HIGH (ref 70–99)
Glucose-Capillary: 156 mg/dL — ABNORMAL HIGH (ref 70–99)
Glucose-Capillary: 145 mg/dL — ABNORMAL HIGH (ref 70–99)
Glucose-Capillary: 139 mg/dL — ABNORMAL HIGH (ref 70–99)

## 2021-09-29 LAB — CBC
HCT: 21.6 % — ABNORMAL LOW (ref 39.0–52.0)
Hemoglobin: 7 g/dL — ABNORMAL LOW (ref 13.0–17.0)
MCH: 31 pg (ref 26.0–34.0)
MCHC: 32.4 g/dL (ref 30.0–36.0)
MCV: 95.6 fL (ref 80.0–100.0)
Platelets: 102 10*3/uL — ABNORMAL LOW (ref 150–400)
RBC: 2.26 MIL/uL — ABNORMAL LOW (ref 4.22–5.81)
RDW: 12.8 % (ref 11.5–15.5)
WBC: 9.6 10*3/uL (ref 4.0–10.5)
nRBC: 0 % (ref 0.0–0.2)

## 2021-09-29 LAB — BASIC METABOLIC PANEL
Anion gap: 3 — ABNORMAL LOW (ref 5–15)
BUN: 13 mg/dL (ref 6–20)
CO2: 25 mmol/L (ref 22–32)
Calcium: 8.2 mg/dL — ABNORMAL LOW (ref 8.9–10.3)
Chloride: 120 mmol/L — ABNORMAL HIGH (ref 98–111)
Creatinine, Ser: 0.93 mg/dL (ref 0.61–1.24)
GFR, Estimated: >60 mL/min (ref >60)
Glucose, Bld: 205 mg/dL — ABNORMAL HIGH (ref 70–99)
Potassium: 3.3 mmol/L — ABNORMAL LOW (ref 3.5–5.1)
Sodium: 148 mmol/L — ABNORMAL HIGH (ref 135–145)
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
## 2021-09-30 LAB — BASIC METABOLIC PANEL
Anion gap: 5 (ref 5–15)
BUN: 15 mg/dL (ref 6–20)
CO2: 26 mmol/L (ref 22–32)
Calcium: 8.3 mg/dL — ABNORMAL LOW (ref 8.9–10.3)
Chloride: 116 mmol/L — ABNORMAL HIGH (ref 98–111)
Creatinine, Ser: 0.78 mg/dL (ref 0.61–1.24)
GFR, Estimated: >60 mL/min (ref >60)
Glucose, Bld: 130 mg/dL — ABNORMAL HIGH (ref 70–99)
Potassium: 3.5 mmol/L (ref 3.5–5.1)
Sodium: 147 mmol/L — ABNORMAL HIGH (ref 135–145)

## 2021-09-30 LAB — GLUCOSE, CAPILLARY
Glucose-Capillary: 123 mg/dL — ABNORMAL HIGH (ref 70–99)
Glucose-Capillary: 164 mg/dL — ABNORMAL HIGH (ref 70–99)
Glucose-Capillary: 136 mg/dL — ABNORMAL HIGH (ref 70–99)
Glucose-Capillary: 149 mg/dL — ABNORMAL HIGH (ref 70–99)
Glucose-Capillary: 177 mg/dL — ABNORMAL HIGH (ref 70–99)
Glucose-Capillary: 160 mg/dL — ABNORMAL HIGH (ref 70–99)

## 2021-09-30 LAB — CBC
HCT: 21.5 % — ABNORMAL LOW (ref 39.0–52.0)
Hemoglobin: 7.1 g/dL — ABNORMAL LOW (ref 13.0–17.0)
MCH: 31.6 pg (ref 26.0–34.0)
MCHC: 33 g/dL (ref 30.0–36.0)
MCV: 95.6 fL (ref 80.0–100.0)
Platelets: 122 10*3/uL — ABNORMAL LOW (ref 150–400)
RBC: 2.25 MIL/uL — ABNORMAL LOW (ref 4.22–5.81)
RDW: 12.9 % (ref 11.5–15.5)
WBC: 9.1 10*3/uL (ref 4.0–10.5)
nRBC: 1.5 % — ABNORMAL HIGH (ref 0.0–0.2)

## 2021-09-30 MED ORDER — SODIUM CHLORIDE 0.9 % IV SOLN
2.0000 g | Freq: Three times a day (TID) | INTRAVENOUS | Status: DC
  Administered 2021-09-30 – 2021-10-02 (×7): 2 g via INTRAVENOUS
  Filled 2021-09-30 (×7): qty 2

## 2021-09-30 NOTE — Progress Notes (Signed)
Subjective: ?Patient reports  patient remains intubated and sedated ? ?Objective: ?Vital signs in last 24 hours: ?Temp:  [98.7 ?F (37.1 ?C)-101.1 ?F (38.4 ?C)] 101.1 ?F (38.4 ?C) (04/03 0800) ?Pulse Rate:  [68-90] 87 (04/03 0825) ?Resp:  [13-16] 13 (04/03 0825) ?BP: (119-183)/(54-75) 134/59 (04/03 2778) ?SpO2:  [99 %-100 %] 100 % (04/03 0825) ?Arterial Line BP: (134-186)/(56-74) 134/60 (04/03 0700) ?FiO2 (%):  [40 %] 40 % (04/03 0825) ?Weight:  [98.5 kg] 98.5 kg (04/03 0400) ? ?Intake/Output from previous day: ?04/02 0701 - 04/03 0700 ?In: 2087.1 [I.V.:107.1; NG/GT:1680; IV Piggyback:300] ?Out: 1750 [Urine:1750] ?Intake/Output this shift: ?Total I/O ?In: 5 [I.V.:5] ?Out: -  ? ?Overall stable neurologic Sam pupils reactive positive corneals positive gag slight extensor posturing right upper extremity and left side. ? ?Lab Results: ?Recent Labs  ?  09/29/21 ?2423 09/30/21 ?5361  ?WBC 9.6 9.1  ?HGB 7.0* 7.1*  ?HCT 21.6* 21.5*  ?PLT 102* 122*  ? ?BMET ?Recent Labs  ?  09/29/21 ?4431 09/30/21 ?5400  ?NA 148* 147*  ?K 3.3* 3.5  ?CL 120* 116*  ?CO2 25 26  ?GLUCOSE 205* 130*  ?BUN 13 15  ?CREATININE 0.93 0.78  ?CALCIUM 8.2* 8.3*  ? ? ?Studies/Results: ?No results found. ? ?Assessment/Plan: ?Postoperative day 5 decompressive hemicraniectomy for subdural patient still comatose still intubated and sedated.  Probably will need tracheostomy and G-tube placement.  Continue supportive care and aggressive medical management ? LOS: 5 days  ? ? ? ?Mariam Dollar ?09/30/2021, 10:26 AM ? ? ? ? ?
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
## 2021-09-30 NOTE — Progress Notes (Signed)
This chaplain responded to PMT consult for providing emotional support for the Pt. Mother-Dayna.  ? ?The chaplain began rapport building with the Pt. Father-Belito and aunt-Charlotte. The chaplain understands Kriste Basque is present, but in the bathroom. The chaplain understands a revisit will be appropriate at another time. ? ?Chaplain Stephanie Acre ?[phone] ?

## 2021-10-01 LAB — BASIC METABOLIC PANEL
Anion gap: 7 (ref 5–15)
BUN: 17 mg/dL (ref 6–20)
CO2: 25 mmol/L (ref 22–32)
Calcium: 8.2 mg/dL — ABNORMAL LOW (ref 8.9–10.3)
Chloride: 116 mmol/L — ABNORMAL HIGH (ref 98–111)
Creatinine, Ser: 0.86 mg/dL (ref 0.61–1.24)
GFR, Estimated: >60 mL/min (ref >60)
Glucose, Bld: 175 mg/dL — ABNORMAL HIGH (ref 70–99)
Potassium: 3.3 mmol/L — ABNORMAL LOW (ref 3.5–5.1)
Sodium: 148 mmol/L — ABNORMAL HIGH (ref 135–145)

## 2021-10-01 LAB — CBC
HCT: 21.2 % — ABNORMAL LOW (ref 39.0–52.0)
Hemoglobin: 6.9 g/dL — CL (ref 13.0–17.0)
MCH: 31.9 pg (ref 26.0–34.0)
MCHC: 32.5 g/dL (ref 30.0–36.0)
MCV: 98.1 fL (ref 80.0–100.0)
Platelets: 150 10*3/uL (ref 150–400)
RBC: 2.16 MIL/uL — ABNORMAL LOW (ref 4.22–5.81)
RDW: 13 % (ref 11.5–15.5)
WBC: 10.2 10*3/uL (ref 4.0–10.5)
nRBC: 2.2 % — ABNORMAL HIGH (ref 0.0–0.2)

## 2021-10-01 LAB — GLUCOSE, CAPILLARY
Glucose-Capillary: 141 mg/dL — ABNORMAL HIGH (ref 70–99)
Glucose-Capillary: 177 mg/dL — ABNORMAL HIGH (ref 70–99)
Glucose-Capillary: 139 mg/dL — ABNORMAL HIGH (ref 70–99)
Glucose-Capillary: 152 mg/dL — ABNORMAL HIGH (ref 70–99)
Glucose-Capillary: 102 mg/dL — ABNORMAL HIGH (ref 70–99)
Glucose-Capillary: 173 mg/dL — ABNORMAL HIGH (ref 70–99)

## 2021-10-01 NOTE — Progress Notes (Signed)
Patient's hemoglobin 6.9, trauma notified.  ?

## 2021-10-01 NOTE — Progress Notes (Signed)
Subjective: ?Patient intubated and comatose.  ? ?Objective: ?Vital signs in last 24 hours: ?Temp:  [99.4 ?F (37.4 ?C)-100.4 ?F (38 ?C)] 100 ?F (37.8 ?C) (04/04 0400) ?Pulse Rate:  [69-88] 77 (04/04 0700) ?Resp:  [12-18] 16 (04/04 0700) ?BP: (116-176)/(47-86) 126/63 (04/04 0700) ?SpO2:  [99 %-100 %] 100 % (04/04 0700) ?Arterial Line BP: (121-189)/(46-66) 155/57 (04/04 0700) ?FiO2 (%):  [40 %] 40 % (04/04 0351) ? ?Intake/Output from previous day: ?04/03 0701 - 04/04 0700 ?In: 2237.3 [I.V.:227.2; NG/GT:1610; IV Piggyback:400.1] ?Out: 1625 [Urine:1625] ?Intake/Output this shift: ?No intake/output data recorded. ? ? ? ?Lab Results: ?Lab Results  ?Component Value Date  ? WBC 10.2 10/01/2021  ? HGB 6.9 (LL) 10/01/2021  ? HCT 21.2 (L) 10/01/2021  ? MCV 98.1 10/01/2021  ? PLT 150 10/01/2021  ? ?Lab Results  ?Component Value Date  ? INR 1.2 09/25/2021  ? ?BMET ?Lab Results  ?Component Value Date  ? NA 148 (H) 10/01/2021  ? K 3.3 (L) 10/01/2021  ? CL 116 (H) 10/01/2021  ? CO2 25 10/01/2021  ? GLUCOSE 175 (H) 10/01/2021  ? BUN 17 10/01/2021  ? CREATININE 0.86 10/01/2021  ? CALCIUM 8.2 (L) 10/01/2021  ? ? ?Studies/Results: ?No results found. ? ?Assessment/Plan: ?Postop day 6 hemicraniectomy. Continues to be comatose with no improvement in his neurologic function. Continue supportive care.  ? ? LOS: 6 days  ? ? ?Ocie [place] Aisling Emigh ?10/01/2021, 8:18 AM ? ? ?  ?
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
## 2021-10-02 LAB — BASIC METABOLIC PANEL
Anion gap: 6 (ref 5–15)
BUN: 21 mg/dL — ABNORMAL HIGH (ref 6–20)
CO2: 24 mmol/L (ref 22–32)
Calcium: 8.1 mg/dL — ABNORMAL LOW (ref 8.9–10.3)
Chloride: 112 mmol/L — ABNORMAL HIGH (ref 98–111)
Creatinine, Ser: 0.82 mg/dL (ref 0.61–1.24)
GFR, Estimated: >60 mL/min (ref >60)
Glucose, Bld: 176 mg/dL — ABNORMAL HIGH (ref 70–99)
Potassium: 3.4 mmol/L — ABNORMAL LOW (ref 3.5–5.1)
Sodium: 142 mmol/L (ref 135–145)

## 2021-10-02 LAB — CULTURE, RESPIRATORY W GRAM STAIN: Culture: NORMAL

## 2021-10-02 LAB — CBC
HCT: 22.3 % — ABNORMAL LOW (ref 39.0–52.0)
Hemoglobin: 7 g/dL — ABNORMAL LOW (ref 13.0–17.0)
MCH: 31.5 pg (ref 26.0–34.0)
MCHC: 31.4 g/dL (ref 30.0–36.0)
MCV: 100.5 fL — ABNORMAL HIGH (ref 80.0–100.0)
Platelets: 177 10*3/uL (ref 150–400)
RBC: 2.22 MIL/uL — ABNORMAL LOW (ref 4.22–5.81)
RDW: 13.3 % (ref 11.5–15.5)
WBC: 11 10*3/uL — ABNORMAL HIGH (ref 4.0–10.5)
nRBC: 1.3 % — ABNORMAL HIGH (ref 0.0–0.2)

## 2021-10-02 LAB — PHOSPHORUS: Phosphorus: 3 mg/dL (ref 2.5–4.6)

## 2021-10-02 LAB — GLUCOSE, CAPILLARY
Glucose-Capillary: 167 mg/dL — ABNORMAL HIGH (ref 70–99)
Glucose-Capillary: 125 mg/dL — ABNORMAL HIGH (ref 70–99)
Glucose-Capillary: 200 mg/dL — ABNORMAL HIGH (ref 70–99)
Glucose-Capillary: 249 mg/dL — ABNORMAL HIGH (ref 70–99)
Glucose-Capillary: 168 mg/dL — ABNORMAL HIGH (ref 70–99)
Glucose-Capillary: 195 mg/dL — ABNORMAL HIGH (ref 70–99)

## 2021-10-02 LAB — MAGNESIUM: Magnesium: 2.2 mg/dL (ref 1.7–2.4)

## 2021-10-02 MED ORDER — HYDRALAZINE HCL 20 MG/ML IJ SOLN
10.0000 mg | Freq: Four times a day (QID) | INTRAMUSCULAR | Status: DC | PRN
Start: 2021-10-02 — End: 2021-11-25
  Administered 2021-10-02 – 2021-10-04 (×2): 10 mg via INTRAVENOUS
  Filled 2021-10-02 (×2): qty 1

## 2021-10-02 MED ORDER — POTASSIUM CHLORIDE 20 MEQ PO PACK
40.0000 meq | PACK | Freq: Once | ORAL | Status: AC
  Administered 2021-10-02: 40 meq
  Filled 2021-10-02: qty 2

## 2021-10-02 NOTE — Progress Notes (Signed)
Patient ID: Sean Bruce, male   DOB: 12-09-1995, 26 y.o.   MRN: [id number] ?Pupils 4 and sluggish minimal movement to noxious stimulation. ? ?No significant change in neuro exam prognosis grave ?

## 2021-10-02 NOTE — Progress Notes (Signed)
Trauma Event Note ? ?TRN rounded on patient. Intubated, comatose with grave prognosis per neurosurgery. Family at bedside. Pending family meeting with palliative on Friday 4/7. No needs for TRN at this time.  ? ?Last imported Vital Signs ?BP (!) 152/61   Pulse 71   Temp 99.6 ?F (37.6 ?C) (Axillary)   Resp 16   Ht 6' (1.829 m)   Wt 217 lb 2.5 oz (98.5 kg)   SpO2 100%   BMI 29.45 kg/m?  ? ?Trending CBC ?Recent Labs  ?  09/30/21 ?0412 10/01/21 ?0522 10/02/21 ?8101  ?WBC 9.1 10.2 11.0*  ?HGB 7.1* 6.9* 7.0*  ?HCT 21.5* 21.2* 22.3*  ?PLT 122* 150 177  ? ? ?Trending Coag's ?No results for input(s): APTT, INR in the last 72 hours. ? ?Trending BMET ?Recent Labs  ?  09/30/21 ?0412 10/01/21 ?0522 10/02/21 ?7510  ?NA 147* 148* 142  ?K 3.5 3.3* 3.4*  ?CL 116* 116* 112*  ?CO2 26 25 24   ?BUN 15 17 21*  ?CREATININE 0.78 0.86 0.82  ?GLUCOSE 130* 175* 176*  ? ? ? ? ?Sean Bruce  ?Trauma Response RN ? ?Please call TRN at [phone] for further assistance. ? ? ?  ?
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
## 2021-10-02 NOTE — Progress Notes (Signed)
RT placed biteblock per RN for patient biting ETT. ETT is still at 26 at the lips. RT will cont to monitor.  ?

## 2021-10-02 NOTE — Progress Notes (Signed)
Subjective: ?Patient still intubated and comatose ? ?Objective: ?Vital signs in last 24 hours: ?Temp:  [98.9 ?F (37.2 ?C)-100.6 ?F (38.1 ?C)] 99.1 ?F (37.3 ?C) (04/05 0800) ?Pulse Rate:  [61-74] 64 (04/05 0832) ?Resp:  [10-16] 14 (04/05 3664) ?BP: (117-166)/(49-82) 152/82 (04/05 4034) ?SpO2:  [98 %-100 %] 100 % (04/05 0833) ?Arterial Line BP: (132-184)/(48-71) 172/71 (04/05 0600) ?FiO2 (%):  [40 %] 40 % (04/05 0833) ? ?Intake/Output from previous day: ?04/04 0701 - 04/05 0700 ?In: 2424.8 [I.V.:244.7; NG/GT:1680; IV Piggyback:500.1] ?Out: 1875 [Urine:1875] ?Intake/Output this shift: ?No intake/output data recorded. ? ? ? ?Lab Results: ?Lab Results  ?Component Value Date  ? WBC 11.0 (H) 10/02/2021  ? HGB 7.0 (L) 10/02/2021  ? HCT 22.3 (L) 10/02/2021  ? MCV 100.5 (H) 10/02/2021  ? PLT 177 10/02/2021  ? ?Lab Results  ?Component Value Date  ? INR 1.2 09/25/2021  ? ?BMET ?Lab Results  ?Component Value Date  ? NA 142 10/02/2021  ? K 3.4 (L) 10/02/2021  ? CL 112 (H) 10/02/2021  ? CO2 24 10/02/2021  ? GLUCOSE 176 (H) 10/02/2021  ? BUN 21 (H) 10/02/2021  ? CREATININE 0.82 10/02/2021  ? CALCIUM 8.1 (L) 10/02/2021  ? ? ?Studies/Results: ?No results found. ? ?Assessment/Plan: ?Postop day 7 hemicraniectomy. Still no change in neuro status. Continues to be comatose.  ? ? LOS: 7 days  ? ? ?Tiana Loft Mignonne Afonso ?10/02/2021, 10:38 AM ? ? ?  ?

## 2021-10-03 LAB — GLUCOSE, CAPILLARY
Glucose-Capillary: 145 mg/dL — ABNORMAL HIGH (ref 70–99)
Glucose-Capillary: 161 mg/dL — ABNORMAL HIGH (ref 70–99)
Glucose-Capillary: 174 mg/dL — ABNORMAL HIGH (ref 70–99)
Glucose-Capillary: 174 mg/dL — ABNORMAL HIGH (ref 70–99)
Glucose-Capillary: 193 mg/dL — ABNORMAL HIGH (ref 70–99)
Glucose-Capillary: 209 mg/dL — ABNORMAL HIGH (ref 70–99)

## 2021-10-03 NOTE — Progress Notes (Signed)
Patient ID: Sean Bruce, male   DOB: Jul 22, 1995, 26 y.o.   MRN: [id number] ?Follow up - Trauma Critical Care ?  ?Patient Details:  ?  ?Sean Bruce is an 26 y.o. male. ? ?Lines/tubes ?: ?Airway 8 mm (Active)  ?Secured at (cm) 26 cm 10/03/21 0804  ?Measured From Lips 10/03/21 0804  ?Secured Location Left 10/03/21 0804  ?Secured By [employer] Tube Holder 10/03/21 0804  ?Tube Holder Repositioned Yes 10/03/21 0804  ?Prone position No 10/03/21 0342  ?Cuff Pressure (cm H2O) Green OR 18-26 [facility] 10/03/21 0804  ?Site Condition Cool;Dry 10/03/21 0804  ?   ?Arterial Line 09/26/21 Left Radial (Active)  ?Site Assessment Clean, Dry, Intact 10/03/21 0700  ?Line Status Pulsatile blood flow 10/03/21 0700  ?Art Line Waveform Appropriate 10/03/21 0700  ?Art Line Interventions Leveled;Connections checked and tightened 10/03/21 0700  ?Color/Movement/Sensation Capillary refill less than 3 sec 10/03/21 0700  ?Dressing Type Transparent 10/03/21 0700  ?Dressing Status Clean, Dry, Intact 10/03/21 0700  ?Interventions New dressing;Antimicrobial disc changed;Dressing changed;Tubing changed 10/01/21 2221  ?Dressing Change Due 10/08/21 10/03/21 0700  ?   ?Urethral Catheter Ardith Darkeresa Crite RN Non-latex 16 Fr. (Active)  ?Indication for Insertion or Continuance of Catheter Unstable spinal/crush injuries / Multisystem Trauma 10/03/21 0800  ?Site Assessment Clean, Dry, Intact 10/03/21 0800  ?Date Prophylactic Dressing Applied (if applicable) 09/28/21 09/28/21 1500  ?Catheter Maintenance Bag below level of bladder;Catheter secured;Drainage bag/tubing not touching floor;Insertion date on drainage bag;No dependent loops;Seal intact 10/03/21 0800  ?Collection Container Standard drainage bag 10/03/21 0800  ?Securement Method Securing device (Describe) 10/03/21 0800  ?Urinary Catheter Interventions (if applicable) Unclamped 10/03/21 0800  ?Output (mL) 450 mL 10/03/21 0500  ? ? ?Microbiology/Sepsis markers: ?Results for orders placed or performed during the  hospital encounter of 09/25/21  ?Resp Panel by RT-PCR (Flu A&B, Covid) Nasopharyngeal Swab     Status: None  ? Collection Time: 09/25/21 11:08 PM  ? Specimen: Nasopharyngeal Swab; Nasopharyngeal(NP) swabs in vial transport medium  ?Result Value Ref Range Status  ? SARS Coronavirus 2 by RT PCR NEGATIVE NEGATIVE Final  ?  Comment: (NOTE) ?SARS-CoV-2 target nucleic acids are NOT DETECTED. ? ?The SARS-CoV-2 RNA is generally detectable in upper respiratory ?specimens during the acute phase of infection. The lowest ?concentration of SARS-CoV-2 viral copies this assay can detect is ?138 copies/mL. A negative result does not preclude SARS-Cov-2 ?infection and should not be used as the sole basis for treatment or ?other patient management decisions. A negative result may occur with  ?improper specimen collection/handling, submission of specimen other ?than nasopharyngeal swab, presence of viral mutation(s) within the ?areas targeted by this assay, and inadequate number of viral ?copies(<138 copies/mL). A negative result must be combined with ?clinical observations, patient history, and epidemiological ?information. The expected result is Negative. ? ?Fact Sheet for Patients:  ?BloggerCourse.comhttps://www.fda.gov/media/152166/download ? ?Fact Sheet for Healthcare Providers:  ?SeriousBroker.ithttps://www.fda.gov/media/152162/download ? ?This test is no t yet approved or cleared by the [place] FDA and  ?has been authorized for detection and/or diagnosis of SARS-CoV-2 by ?FDA under an Emergency Use Authorization (EUA). This EUA will remain  ?in effect (meaning this test can be used) for the duration of the ?COVID-19 declaration under Section 564(b)(1) of the Act, 21 ?U.S.C.section 360bbb-3(b)(1), unless the authorization is terminated  ?or revoked sooner.  ? ? ?  ? Influenza A by PCR NEGATIVE NEGATIVE Final  ? Influenza B by PCR NEGATIVE NEGATIVE Final  ?  Comment: (NOTE) ?The Xpert Xpress SARS-CoV-2/FLU/RSV plus assay is intended as an aid ?  in the  diagnosis of influenza from Nasopharyngeal swab specimens and ?should not be used as a sole basis for treatment. Nasal washings and ?aspirates are unacceptable for Xpert Xpress SARS-CoV-2/FLU/RSV ?testing. ? ?Fact Sheet for Patients: ?BloggerCourse.com ? ?Fact Sheet for Healthcare Providers: ?SeriousBroker.it ? ?This test is not yet approved or cleared by the [place] FDA and ?has been authorized for detection and/or diagnosis of SARS-CoV-2 by ?FDA under an Emergency Use Authorization (EUA). This EUA will remain ?in effect (meaning this test can be used) for the duration of the ?COVID-19 declaration under Section 564(b)(1) of the Act, 21 U.S.C. ?section 360bbb-3(b)(1), unless the authorization is terminated or ?revoked. ? ?Performed at [facility], [address] [place], [place] ?[address] ?  ?MRSA Next Gen by PCR, Nasal     Status: None  ? Collection Time: 09/26/21  3:36 AM  ? Specimen: Nasal Mucosa; Nasal Swab  ?Result Value Ref Range Status  ? MRSA by PCR Next Gen NOT DETECTED NOT DETECTED Final  ?  Comment: (NOTE) ?The GeneXpert MRSA Assay (FDA approved for NASAL specimens only), ?is one component of a comprehensive MRSA colonization surveillance ?program. It is not intended to diagnose MRSA infection nor to guide ?or monitor treatment for MRSA infections. ?Test performance is not FDA approved in patients less than 2 years ?old. ?Performed at [facility], [address] [place], [place] ?[address] ?  ?Culture, Respiratory w Gram Stain     Status: None  ? Collection Time: 09/30/21  8:43 AM  ? Specimen: Tracheal Aspirate; Respiratory  ?Result Value Ref Range Status  ? Specimen Description TRACHEAL ASPIRATE  Final  ? Special Requests NONE  Final  ? Gram Stain   Final  ?  MODERATE WBC PRESENT,BOTH PMN AND MONONUCLEAR ?FEW GRAM POSITIVE COCCI IN PAIRS ?RARE GRAM POSITIVE COCCI IN CHAINS ?RARE GRAM NEGATIVE RODS ?  ? Culture   Final  ?  FEW Normal  respiratory flora-no Staph aureus or Pseudomonas seen ?Performed at [facility], [address] [place], [place] [address] ?  ? Report Status 10/02/2021 FINAL  Final  ? ? ?Anti-infectives:  ?Anti-infectives (From admission, onward)  ? ? Start     Dose/Rate Route Frequency Ordered Stop  ? 09/30/21 1000  ceFEPIme (MAXIPIME) 2 g in sodium chloride 0.9 % 100 mL IVPB  Status:  Discontinued       ? 2 g ?200 mL/hr over 30 Minutes Intravenous Every 8 hours 09/30/21 0843 10/02/21 1344  ? 09/26/21 1400  ceFAZolin (ANCEF) IVPB 2g/100 mL premix  Status:  Discontinued       ? 2 g ?200 mL/hr over 30 Minutes Intravenous Every 8 hours 09/26/21 1050 09/29/21 0835  ? 09/26/21 0015  ceFAZolin (ANCEF) IVPB 2g/100 mL premix       ? 2 g ?200 mL/hr over 30 Minutes Intravenous  Once 09/26/21 0004 09/26/21 0107  ? ?  ? ?Consults: ?Treatment Team:  ?Donalee Citrin, MD  ? ? ?Studies: ? ? ? ?Events: ? ?Subjective:  ?  ?Overnight Issues:  ? ?Objective:  ?Vital signs for last 24 hours: ?Temp:  [99.1 ?F (37.3 ?C)-100.2 ?F (37.9 ?C)] 99.1 ?F (37.3 ?C) (04/06 0800) ?Pulse Rate:  [63-84] 64 (04/06 0900) ?Resp:  [13-17] 14 (04/06 0900) ?BP: (109-157)/(49-70) 130/62 (04/06 0900) ?SpO2:  [99 %-100 %] 100 % (04/06 0900) ?Arterial Line BP: (116-167)/(47-67) 167/63 (04/06 0900) ?FiO2 (%):  [40 %] 40 % (04/06 0804) ? ?Hemodynamic parameters for last 24 hours: ?  ? ?  Intake/Output from previous day: ?04/05 0701 - 04/06 0700 ?In: 2204.7 [I.V.:254.6; NG/GT:1750; IV Piggyback:200.1] ?Out: 1800 [Urine:1800]  ?Intake/Output this shift: ?Total I/O ?In: 160 [I.V.:20; NG/GT:140] ?Out: -  ? ?Vent settings for last 24 hours: ?Vent Mode: PSV;CPAP ?FiO2 (%):  [40 %] 40 % ?Set Rate:  [16 bmp] 16 bmp ?Vt Set:  [620 mL] 620 mL ?PEEP:  [5 cmH20] 5 cmH20 ?Pressure Support:  [10 cmH20] 10 cmH20 ?Plateau Pressure:  [11 cmH20-15 cmH20] 11 cmH20 ? ?Physical Exam:  ?General: on vent ?Neuro: +gag, eyeflutter to stim, no other movement ?HEENT/Neck: ETT ?Resp: clear to  auscultation bilaterally ?CVS: RRR ?GI: soft, NT ?Extremities: mild edema ? ?Results for orders placed or performed during the hospital encounter of 09/25/21 (from the past 24 hour(s))  ?Glucose, capillary     Status:

## 2021-10-03 NOTE — Progress Notes (Signed)
Patient ID: Sean Bruce, male   DOB: Nov 02, 1995, 26 y.o.   MRN: [id number] ?No significant change neurologically.  Family meeting more than likely Monday to discuss goals of care and whether to proceed forward with trach and G-tube.  Be worthwhile to check head CT some point over the weekend. ?

## 2021-10-03 NOTE — Progress Notes (Signed)
Trauma Event Note ? ? ?TRN at bedside to round. Remains intubated, fentanyl for sedation, not following commands and no movement to painful stimuli. Decerebrate posturing to left arm with stimulation per primary RN. Family at bedside, pending family meeting for goals of care, likely to occur Monday due to Easter holiday weekend. Checked in with primary nurse, no needs at this time. ? ?Last imported Vital Signs ?BP 116/64   Pulse 70   Temp 99.7 ?F (37.6 ?C) (Oral)   Resp 16   Ht 6' (1.829 m)   Wt 217 lb 2.5 oz (98.5 kg)   SpO2 100%   BMI 29.45 kg/m?  ? ?Trending CBC ?Recent Labs  ?  10/01/21 ?0522 10/02/21 ?3536  ?WBC 10.2 11.0*  ?HGB 6.9* 7.0*  ?HCT 21.2* 22.3*  ?PLT 150 177  ? ? ?Trending Coag's ?No results for input(s): APTT, INR in the last 72 hours. ? ?Trending BMET ?Recent Labs  ?  10/01/21 ?0522 10/02/21 ?1443  ?NA 148* 142  ?K 3.3* 3.4*  ?CL 116* 112*  ?CO2 25 24  ?BUN 17 21*  ?CREATININE 0.86 0.82  ?GLUCOSE 175* 176*  ? ? ? ? ?Sean Bruce O Sean Bruce  ?Trauma Response RN ? ?Please call TRN at [phone] for further assistance. ? ? ?  ?

## 2021-10-04 ENCOUNTER — Inpatient Hospital Stay (HOSPITAL_COMMUNITY): Payer: Medicaid Other

## 2021-10-04 LAB — GLUCOSE, CAPILLARY
Glucose-Capillary: 230 mg/dL — ABNORMAL HIGH (ref 70–99)
Glucose-Capillary: 307 mg/dL — ABNORMAL HIGH (ref 70–99)
Glucose-Capillary: 284 mg/dL — ABNORMAL HIGH (ref 70–99)
Glucose-Capillary: 229 mg/dL — ABNORMAL HIGH (ref 70–99)
Glucose-Capillary: 202 mg/dL — ABNORMAL HIGH (ref 70–99)
Glucose-Capillary: 216 mg/dL — ABNORMAL HIGH (ref 70–99)

## 2021-10-04 LAB — CBC
HCT: 26.8 % — ABNORMAL LOW (ref 39.0–52.0)
Hemoglobin: 8.3 g/dL — ABNORMAL LOW (ref 13.0–17.0)
MCH: 31.1 pg (ref 26.0–34.0)
MCHC: 31 g/dL (ref 30.0–36.0)
MCV: 100.4 fL — ABNORMAL HIGH (ref 80.0–100.0)
Platelets: 315 10*3/uL (ref 150–400)
RBC: 2.67 MIL/uL — ABNORMAL LOW (ref 4.22–5.81)
RDW: 13.6 % (ref 11.5–15.5)
WBC: 19.1 10*3/uL — ABNORMAL HIGH (ref 4.0–10.5)
nRBC: 1.5 % — ABNORMAL HIGH (ref 0.0–0.2)

## 2021-10-04 LAB — BASIC METABOLIC PANEL
Anion gap: 7 (ref 5–15)
BUN: 17 mg/dL (ref 6–20)
CO2: 28 mmol/L (ref 22–32)
Calcium: 9.1 mg/dL (ref 8.9–10.3)
Chloride: 108 mmol/L (ref 98–111)
Creatinine, Ser: 0.87 mg/dL (ref 0.61–1.24)
GFR, Estimated: >60 mL/min (ref >60)
Glucose, Bld: 174 mg/dL — ABNORMAL HIGH (ref 70–99)
Potassium: 4.2 mmol/L (ref 3.5–5.1)
Sodium: 143 mmol/L (ref 135–145)

## 2021-10-04 MED ORDER — BETHANECHOL CHLORIDE 10 MG PO TABS
10.0000 mg | ORAL_TABLET | Freq: Three times a day (TID) | ORAL | Status: DC
  Administered 2021-10-04 – 2021-10-10 (×21): 10 mg
  Filled 2021-10-04 (×20): qty 1

## 2021-10-04 MED ORDER — BETHANECHOL CHLORIDE 10 MG PO TABS
10.0000 mg | ORAL_TABLET | Freq: Three times a day (TID) | ORAL | Status: DC
  Filled 2021-10-04: qty 1

## 2021-10-04 NOTE — Progress Notes (Signed)
Subjective: ?The patient is comatose and in no apparent distress.  There are multiple family members at the bedside. ? ?Objective: ?Vital signs in last 24 hours: ?Temp:  [98.7 ?F (37.1 ?C)-102 ?F (38.9 ?C)] 102 ?F (38.9 ?C) (04/07 0800) ?Pulse Rate:  [64-86] 81 (04/07 0900) ?Resp:  [12-33] 21 (04/07 0900) ?BP: (116-185)/(55-89) 172/74 (04/07 0900) ?SpO2:  [98 %-100 %] 100 % (04/07 0900) ?Arterial Line BP: (157-164)/(52-60) 157/56 (04/06 1300) ?FiO2 (%):  [40 %] 40 % (04/07 0800) ?Weight:  [98.3 kg] 98.3 kg (04/07 0450) ?Estimated body mass index is 29.39 kg/m? as calculated from the following: ?  Height as of this encounter: 6' (1.829 m). ?  Weight as of this encounter: 98.3 kg. ? ? ?Intake/Output from previous day: ?04/06 0701 - 04/07 0700 ?In: 1929.2 [I.V.:249.2; NG/GT:1680] ?Out: 2925 [Urine:2925] ?Intake/Output this shift: ?Total I/O ?In: 160 [I.V.:20; NG/GT:140] ?Out: -  ? ?Physical exam Glasgow Coma Scale 3 intubated.  His left pupil is approximately 2 mm, his right pupil is approximately 4 mm.  The patient's scalp is tense on the right. ? ?Lab Results: ?Recent Labs  ?  10/02/21 ?9518 10/04/21 ?0245  ?WBC 11.0* 19.1*  ?HGB 7.0* 8.3*  ?HCT 22.3* 26.8*  ?PLT 177 315  ? ?BMET ?Recent Labs  ?  10/02/21 ?8416 10/04/21 ?0245  ?NA 142 143  ?K 3.4* 4.2  ?CL 112* 108  ?CO2 24 28  ?GLUCOSE 176* 174*  ?BUN 21* 17  ?CREATININE 0.82 0.87  ?CALCIUM 8.1* 9.1  ? ? ?Studies/Results: ?No results found. ? ?Assessment/Plan: ?Subdural hematoma, cerebral contusions, right PCA infarct: I had a long discussion with the patient's family members and with his mother via FaceTime.  I have reinforced that he is very sick and that his prognosis is very poor given his CT scan findings and the fact that he has not improved 7 days after surgery.  I have answered all their questions.  They want to continue with supportive care.  I will get a head CT to see if there is any operable lesions. ? LOS: 9 days  ? ? ? ?Cristi Loron ?10/04/2021,  9:37 AM ? ? ? ? ? ?

## 2021-10-04 NOTE — Progress Notes (Signed)
I have reviewed the patient follow-up head CT performed today.  The patient continues to have bilateral cytotoxic edema, protrusion of brain through the craniectomy defect, bifrontal contusions, a small interhemispheric subdural hematoma.  There is no midline shift or hydrocephalus.  I do not see any correctable lesions. ?
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
## 2021-10-04 NOTE — Progress Notes (Signed)
Pt transported to and from CT scan on the ventilator without incident. 
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
## 2021-10-04 NOTE — Progress Notes (Addendum)
Patient ID: Sean Bruce Sorter, male   DOB: 03-Aug-1995, 26 y.o.   MRN: [id number] ?Follow up - Trauma Critical Care ?  ?Patient Details:  ?  ?Sean Bruce [place] is an 26 y.o. male. ? ?Lines/tubes ?: ?Airway 8 mm (Active)  ?Secured at (cm) 26 cm 10/03/21 0804  ?Measured From Lips 10/03/21 0804  ?Secured Location Left 10/03/21 0804  ?Secured By [employer] Tube Holder 10/03/21 0804  ?Tube Holder Repositioned Yes 10/03/21 0804  ?Prone position No 10/03/21 0342  ?Cuff Pressure (cm H2O) Green OR 18-26 [facility] 10/03/21 0804  ?Site Condition Cool;Dry 10/03/21 0804  ?   ?Arterial Line 09/26/21 Left Radial (Active)  ?Site Assessment Clean, Dry, Intact 10/03/21 0700  ?Line Status Pulsatile blood flow 10/03/21 0700  ?Art Line Waveform Appropriate 10/03/21 0700  ?Art Line Interventions Leveled;Connections checked and tightened 10/03/21 0700  ?Color/Movement/Sensation Capillary refill less than 3 sec 10/03/21 0700  ?Dressing Type Transparent 10/03/21 0700  ?Dressing Status Clean, Dry, Intact 10/03/21 0700  ?Interventions New dressing;Antimicrobial disc changed;Dressing changed;Tubing changed 10/01/21 2221  ?Dressing Change Due 10/08/21 10/03/21 0700  ?   ?Urethral Catheter Ardith Darkeresa Crite RN Non-latex 16 Fr. (Active)  ?Indication for Insertion or Continuance of Catheter Unstable spinal/crush injuries / Multisystem Trauma 10/03/21 0800  ?Site Assessment Clean, Dry, Intact 10/03/21 0800  ?Date Prophylactic Dressing Applied (if applicable) 09/28/21 09/28/21 1500  ?Catheter Maintenance Bag below level of bladder;Catheter secured;Drainage bag/tubing not touching floor;Insertion date on drainage bag;No dependent loops;Seal intact 10/03/21 0800  ?Collection Container Standard drainage bag 10/03/21 0800  ?Securement Method Securing device (Describe) 10/03/21 0800  ?Urinary Catheter Interventions (if applicable) Unclamped 10/03/21 0800  ?Output (mL) 450 mL 10/03/21 0500  ? ? ?Microbiology/Sepsis markers: ?Results for orders placed or performed during the  hospital encounter of 09/25/21  ?Resp Panel by RT-PCR (Flu A&B, Covid) Nasopharyngeal Swab     Status: None  ? Collection Time: 09/25/21 11:08 PM  ? Specimen: Nasopharyngeal Swab; Nasopharyngeal(NP) swabs in vial transport medium  ?Result Value Ref Range Status  ? SARS Coronavirus 2 by RT PCR NEGATIVE NEGATIVE Final  ?  Comment: (NOTE) ?SARS-CoV-2 target nucleic acids are NOT DETECTED. ? ?The SARS-CoV-2 RNA is generally detectable in upper respiratory ?specimens during the acute phase of infection. The lowest ?concentration of SARS-CoV-2 viral copies this assay can detect is ?138 copies/mL. A negative result does not preclude SARS-Cov-2 ?infection and should not be used as the sole basis for treatment or ?other patient management decisions. A negative result may occur with  ?improper specimen collection/handling, submission of specimen other ?than nasopharyngeal swab, presence of viral mutation(s) within the ?areas targeted by this assay, and inadequate number of viral ?copies(<138 copies/mL). A negative result must be combined with ?clinical observations, patient history, and epidemiological ?information. The expected result is Negative. ? ?Fact Sheet for Patients:  ?BloggerCourse.comhttps://www.fda.gov/media/152166/download ? ?Fact Sheet for Healthcare Providers:  ?SeriousBroker.ithttps://www.fda.gov/media/152162/download ? ?This test is no t yet approved or cleared by the [place] FDA and  ?has been authorized for detection and/or diagnosis of SARS-CoV-2 by ?FDA under an Emergency Use Authorization (EUA). This EUA will remain  ?in effect (meaning this test can be used) for the duration of the ?COVID-19 declaration under Section 564(b)(1) of the Act, 21 ?U.S.C.section 360bbb-3(b)(1), unless the authorization is terminated  ?or revoked sooner.  ? ? ?  ? Influenza A by PCR NEGATIVE NEGATIVE Final  ? Influenza B by PCR NEGATIVE NEGATIVE Final  ?  Comment: (NOTE) ?The Xpert Xpress SARS-CoV-2/FLU/RSV plus assay is intended as an aid ?  in the  diagnosis of influenza from Nasopharyngeal swab specimens and ?should not be used as a sole basis for treatment. Nasal washings and ?aspirates are unacceptable for Xpert Xpress SARS-CoV-2/FLU/RSV ?testing. ? ?Fact Sheet for Patients: ?BloggerCourse.com ? ?Fact Sheet for Healthcare Providers: ?SeriousBroker.it ? ?This test is not yet approved or cleared by the [place] FDA and ?has been authorized for detection and/or diagnosis of SARS-CoV-2 by ?FDA under an Emergency Use Authorization (EUA). This EUA will remain ?in effect (meaning this test can be used) for the duration of the ?COVID-19 declaration under Section 564(b)(1) of the Act, 21 U.S.C. ?section 360bbb-3(b)(1), unless the authorization is terminated or ?revoked. ? ?Performed at [facility], [address] [place], [place] ?[address] ?  ?MRSA Next Gen by PCR, Nasal     Status: None  ? Collection Time: 09/26/21  3:36 AM  ? Specimen: Nasal Mucosa; Nasal Swab  ?Result Value Ref Range Status  ? MRSA by PCR Next Gen NOT DETECTED NOT DETECTED Final  ?  Comment: (NOTE) ?The GeneXpert MRSA Assay (FDA approved for NASAL specimens only), ?is one component of a comprehensive MRSA colonization surveillance ?program. It is not intended to diagnose MRSA infection nor to guide ?or monitor treatment for MRSA infections. ?Test performance is not FDA approved in patients less than 2 years ?old. ?Performed at [facility], [address], [place], [place] ?[address] ?  ?Culture, Respiratory w Gram Stain     Status: None  ? Collection Time: 09/30/21  8:43 AM  ? Specimen: Tracheal Aspirate; Respiratory  ?Result Value Ref Range Status  ? Specimen Description TRACHEAL ASPIRATE  Final  ? Special Requests NONE  Final  ? Gram Stain   Final  ?  MODERATE WBC PRESENT,BOTH PMN AND MONONUCLEAR ?FEW GRAM POSITIVE COCCI IN PAIRS ?RARE GRAM POSITIVE COCCI IN CHAINS ?RARE GRAM NEGATIVE RODS ?  ? Culture   Final  ?  FEW Normal  respiratory flora-no Staph aureus or Pseudomonas seen ?Performed at [facility], [address] [place], [place] [address] ?  ? Report Status 10/02/2021 FINAL  Final  ? ? ?Anti-infectives:  ?Anti-infectives (From admission, onward)  ? ? Start     Dose/Rate Route Frequency Ordered Stop  ? 09/30/21 1000  ceFEPIme (MAXIPIME) 2 g in sodium chloride 0.9 % 100 mL IVPB  Status:  Discontinued       ? 2 g ?200 mL/hr over 30 Minutes Intravenous Every 8 hours 09/30/21 0843 10/02/21 1344  ? 09/26/21 1400  ceFAZolin (ANCEF) IVPB 2g/100 mL premix  Status:  Discontinued       ? 2 g ?200 mL/hr over 30 Minutes Intravenous Every 8 hours 09/26/21 1050 09/29/21 0835  ? 09/26/21 0015  ceFAZolin (ANCEF) IVPB 2g/100 mL premix       ? 2 g ?200 mL/hr over 30 Minutes Intravenous  Once 09/26/21 0004 09/26/21 0107  ? ?  ? ?Consults: ?Treatment Team:  ?Donalee Citrin, MD  ? ? ?Studies: ? ? ? ?Events: ? ?Subjective:  ?  ?Overnight Issues:  ? ?Objective:  ?Vital signs for last 24 hours: ?Temp:  [98.7 ?F (37.1 ?C)-100.8 ?F (38.2 ?C)] 100.8 ?F (38.2 ?C) (04/07 0400) ?Pulse Rate:  [64-86] 82 (04/07 0744) ?Resp:  [12-33] 21 (04/07 0744) ?BP: (114-185)/(55-89) 168/69 (04/07 0700) ?SpO2:  [98 %-100 %] 99 % (04/07 0744) ?Arterial Line BP: (144-167)/(52-63) 157/56 (04/06 1300) ?FiO2 (%):  [40 %] 40 % (04/07 0744) ?Weight:  [98.3 kg] 98.3 kg (04/07  1937) ? ?Hemodynamic parameters for last 24 hours: ?  ? ?Intake/Output from previous day: ?04/06 0701 - 04/07 0700 ?In: 1929.2 [I.V.:249.2; NG/GT:1680] ?Out: 2925 [Urine:2925]  ?Intake/Output this shift: ?No intake/output data recorded. ? ?Vent settings for last 24 hours: ?Vent Mode: PSV;CPAP ?FiO2 (%):  [40 %] 40 % ?Set Rate:  [16 bmp] 16 bmp ?Vt Set:  [620 mL] 620 mL ?PEEP:  [5 cmH20] 5 cmH20 ?Pressure Support:  [10 cmH20] 10 cmH20 ?Plateau Pressure:  [14 cmH20-16 cmH20] 14 cmH20 ? ?Physical Exam:  ?General: on vent ?Neuro: +gag, eyeflutter to stim, no other movement ?HEENT/Neck: ETT ?Resp: clear to  auscultation bilaterally ?CVS: RRR ?GI: soft, NT ?Extremities: mild edema ? ?Results for orders placed or performed during the hospital encounter of 09/25/21 (from the past 24 hour(s))  ?Glucose, capillary     Stat

## 2021-10-05 LAB — GLUCOSE, CAPILLARY
Glucose-Capillary: 242 mg/dL — ABNORMAL HIGH (ref 70–99)
Glucose-Capillary: 198 mg/dL — ABNORMAL HIGH (ref 70–99)
Glucose-Capillary: 201 mg/dL — ABNORMAL HIGH (ref 70–99)
Glucose-Capillary: 175 mg/dL — ABNORMAL HIGH (ref 70–99)
Glucose-Capillary: 223 mg/dL — ABNORMAL HIGH (ref 70–99)

## 2021-10-05 LAB — CBC
HCT: 25.1 % — ABNORMAL LOW (ref 39.0–52.0)
Hemoglobin: 7.7 g/dL — ABNORMAL LOW (ref 13.0–17.0)
MCH: 30.6 pg (ref 26.0–34.0)
MCHC: 30.7 g/dL (ref 30.0–36.0)
MCV: 99.6 fL (ref 80.0–100.0)
Platelets: 406 10*3/uL — ABNORMAL HIGH (ref 150–400)
RBC: 2.52 MIL/uL — ABNORMAL LOW (ref 4.22–5.81)
RDW: 13.7 % (ref 11.5–15.5)
WBC: 20.7 10*3/uL — ABNORMAL HIGH (ref 4.0–10.5)
nRBC: 0.6 % — ABNORMAL HIGH (ref 0.0–0.2)

## 2021-10-05 MED ORDER — INSULIN ASPART 100 UNIT/ML IJ SOLN
3.0000 [IU] | INTRAMUSCULAR | Status: AC
  Administered 2021-10-05 – 2021-10-25 (×113): 3 [IU] via SUBCUTANEOUS

## 2021-10-05 MED ORDER — FLEET ENEMA 7-19 GM/118ML RE ENEM
1.0000 | ENEMA | Freq: Every day | RECTAL | Status: DC | PRN
  Administered 2021-10-05: 1 via RECTAL
  Filled 2021-10-05: qty 1

## 2021-10-05 NOTE — Progress Notes (Signed)
Patient ID: Sean Bruce, male   DOB: 05/09/1996, 25 y.o.   MRN: [id number] ?BP (!) 147/65   Pulse 75   Temp 99.5 ?F (37.5 ?C) (Axillary)   Resp 16   Ht 6' (1.829 m)   Wt 98.3 kg   SpO2 100%   BMI 29.39 kg/m?  ?Comatose ?Pupils dilated not reactive to light ?+corneals ?No response to peripheral noxious stimuli ?No change. Will order enema ?
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
## 2021-10-06 LAB — GLUCOSE, CAPILLARY
Glucose-Capillary: 137 mg/dL — ABNORMAL HIGH (ref 70–99)
Glucose-Capillary: 141 mg/dL — ABNORMAL HIGH (ref 70–99)
Glucose-Capillary: 153 mg/dL — ABNORMAL HIGH (ref 70–99)
Glucose-Capillary: 154 mg/dL — ABNORMAL HIGH (ref 70–99)
Glucose-Capillary: 201 mg/dL — ABNORMAL HIGH (ref 70–99)
Glucose-Capillary: 206 mg/dL — ABNORMAL HIGH (ref 70–99)
Glucose-Capillary: 237 mg/dL — ABNORMAL HIGH (ref 70–99)

## 2021-10-06 LAB — BASIC METABOLIC PANEL
Anion gap: 8 (ref 5–15)
BUN: 21 mg/dL — ABNORMAL HIGH (ref 6–20)
CO2: 26 mmol/L (ref 22–32)
Calcium: 9.2 mg/dL (ref 8.9–10.3)
Chloride: 107 mmol/L (ref 98–111)
Creatinine, Ser: 0.86 mg/dL (ref 0.61–1.24)
GFR, Estimated: >60 mL/min (ref >60)
Glucose, Bld: 145 mg/dL — ABNORMAL HIGH (ref 70–99)
Potassium: 4 mmol/L (ref 3.5–5.1)
Sodium: 141 mmol/L (ref 135–145)

## 2021-10-06 LAB — CBC
HCT: 26.6 % — ABNORMAL LOW (ref 39.0–52.0)
Hemoglobin: 8.4 g/dL — ABNORMAL LOW (ref 13.0–17.0)
MCH: 31.3 pg (ref 26.0–34.0)
MCHC: 31.6 g/dL (ref 30.0–36.0)
MCV: 99.3 fL (ref 80.0–100.0)
Platelets: 438 10*3/uL — ABNORMAL HIGH (ref 150–400)
RBC: 2.68 MIL/uL — ABNORMAL LOW (ref 4.22–5.81)
RDW: 13.5 % (ref 11.5–15.5)
WBC: 19.4 10*3/uL — ABNORMAL HIGH (ref 4.0–10.5)
nRBC: 0.7 % — ABNORMAL HIGH (ref 0.0–0.2)

## 2021-10-06 MED ORDER — LORAZEPAM 2 MG/ML IJ SOLN: 1.0000 mg | INTRAMUSCULAR | Status: DC | PRN

## 2021-10-06 MED ORDER — SODIUM CHLORIDE 0.9 % IV SOLN
INTRAVENOUS | Status: DC | PRN
Start: 2021-10-06 — End: 2021-11-25
  Administered 2021-10-18: 10 mL/h via INTRAVENOUS

## 2021-10-06 MED ORDER — LEVETIRACETAM IN NACL 500 MG/100ML IV SOLN
500.0000 mg | Freq: Two times a day (BID) | INTRAVENOUS | Status: DC
  Administered 2021-10-06 – 2021-10-10 (×8): 500 mg via INTRAVENOUS
  Filled 2021-10-06 (×8): qty 100

## 2021-10-06 MED ORDER — LORAZEPAM 2 MG/ML IJ SOLN
1.0000 mg | Freq: Once | INTRAMUSCULAR | Status: AC
  Administered 2021-10-06: 1 mg via INTRAVENOUS

## 2021-10-06 MED ORDER — LORAZEPAM 2 MG/ML IJ SOLN
INTRAMUSCULAR | Status: AC
  Administered 2021-10-06: 2 mg
  Filled 2021-10-06: qty 1

## 2021-10-06 MED ORDER — LORAZEPAM 2 MG/ML IJ SOLN
INTRAMUSCULAR | Status: AC
  Filled 2021-10-06: qty 1

## 2021-10-06 NOTE — Progress Notes (Signed)
Patient ID: Sean Bruce, male   DOB: 1995/07/05, 26 y.o.   MRN: [id number] ?BP 129/68   Pulse 92   Temp (!) 100.5 ?F (38.1 ?C)   Resp 15   Ht 6' (1.829 m)   Wt 98.5 kg   SpO2 100%   BMI 29.45 kg/m?  ?Comatose ?Possible seizure activity on left side. Will start keppra ?Wounds are clean. ?No neurological changes otherwise ?Intubated and sedated ?

## 2021-10-06 NOTE — Progress Notes (Addendum)
Trauma Event Note ? ? ?TRN at bedside to round. Patient remains intubated, no response to painful stimuli consistent with previous exam. Received enema for constipation yesterday with positive results. Family at bedside. Checked in with primary nurse, who has no needs for TRN at this time. Pending family meeting on Monday for goals of care.  ? ?Last imported Vital Signs ?BP (!) 129/56 (BP Location: Left Arm)   Pulse (!) 101   Temp 100.3 ?F (37.9 ?C) (Axillary)   Resp 16   Ht 6' (1.829 m)   Wt 216 lb 11.4 oz (98.3 kg)   SpO2 99%   BMI 29.39 kg/m?  ? ?Trending CBC ?Recent Labs  ?  10/04/21 ?0245 10/05/21 ?0954  ?WBC 19.1* 20.7*  ?HGB 8.3* 7.7*  ?HCT 26.8* 25.1*  ?PLT 315 406*  ? ? ?Trending Coag's ?No results for input(s): APTT, INR in the last 72 hours. ? ?Trending BMET ?Recent Labs  ?  10/04/21 ?0245  ?NA 143  ?K 4.2  ?CL 108  ?CO2 28  ?BUN 17  ?CREATININE 0.87  ?GLUCOSE 174*  ? ? ? ? ?Dillon  ?Trauma Response RN ? ?Please call TRN at [phone] for further assistance. ? ? ?  ?

## 2021-10-06 NOTE — Progress Notes (Signed)
Pt noted to have left hand rhythmical twitching lasting about 30 sec. Dr. Franky Macho notified. Order given to give 1 mg ativan if occurs again. About 5 minutes later event occurred again. Ativan given and twitching immediately stopped. Dr. Franky Macho notified and orders for keppra entered. ?
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
## 2021-10-06 NOTE — Progress Notes (Addendum)
Patient seizing again with rhythmic twitching on face and hand - request for IV ativan.  ?Heart rate into 140's.  ? ?Ativan given & will continue to monitor.  ?

## 2021-10-07 ENCOUNTER — Encounter (HOSPITAL_COMMUNITY): Admission: EM | Disposition: A | Discharge status: Skilled Nursing Facility | Payer: Self-pay | Source: Home / Self Care

## 2021-10-07 LAB — CBC
HCT: 26 % — ABNORMAL LOW (ref 39.0–52.0)
Hemoglobin: 7.9 g/dL — ABNORMAL LOW (ref 13.0–17.0)
MCH: 30.4 pg (ref 26.0–34.0)
MCHC: 30.4 g/dL (ref 30.0–36.0)
MCV: 100 fL (ref 80.0–100.0)
Platelets: 429 10*3/uL — ABNORMAL HIGH (ref 150–400)
RBC: 2.6 MIL/uL — ABNORMAL LOW (ref 4.22–5.81)
RDW: 13.4 % (ref 11.5–15.5)
WBC: 19.7 10*3/uL — ABNORMAL HIGH (ref 4.0–10.5)
nRBC: 0.7 % — ABNORMAL HIGH (ref 0.0–0.2)

## 2021-10-07 LAB — GLUCOSE, CAPILLARY
Glucose-Capillary: 226 mg/dL — ABNORMAL HIGH (ref 70–99)
Glucose-Capillary: 224 mg/dL — ABNORMAL HIGH (ref 70–99)
Glucose-Capillary: 240 mg/dL — ABNORMAL HIGH (ref 70–99)
Glucose-Capillary: 223 mg/dL — ABNORMAL HIGH (ref 70–99)
Glucose-Capillary: 207 mg/dL — ABNORMAL HIGH (ref 70–99)
Glucose-Capillary: 198 mg/dL — ABNORMAL HIGH (ref 70–99)

## 2021-10-07 SURGERY — CREATION, TRACHEOSTOMY
Anesthesia: General

## 2021-10-07 MED ORDER — ENOXAPARIN SODIUM 40 MG/0.4ML IJ SOSY
40.0000 mg | PREFILLED_SYRINGE | Freq: Two times a day (BID) | INTRAMUSCULAR | Status: DC
  Administered 2021-10-07 – 2021-11-25 (×97): 40 mg via SUBCUTANEOUS
  Filled 2021-10-07 (×97): qty 0.4
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
## 2021-10-08 LAB — GLUCOSE, CAPILLARY
Glucose-Capillary: 207 mg/dL — ABNORMAL HIGH (ref 70–99)
Glucose-Capillary: 225 mg/dL — ABNORMAL HIGH (ref 70–99)
Glucose-Capillary: 200 mg/dL — ABNORMAL HIGH (ref 70–99)
Glucose-Capillary: 169 mg/dL — ABNORMAL HIGH (ref 70–99)
Glucose-Capillary: 219 mg/dL — ABNORMAL HIGH (ref 70–99)

## 2021-10-08 LAB — CBC
HCT: 25.9 % — ABNORMAL LOW (ref 39.0–52.0)
Hemoglobin: 7.9 g/dL — ABNORMAL LOW (ref 13.0–17.0)
MCH: 30.5 pg (ref 26.0–34.0)
MCHC: 30.5 g/dL (ref 30.0–36.0)
MCV: 100 fL (ref 80.0–100.0)
Platelets: 440 10*3/uL — ABNORMAL HIGH (ref 150–400)
RBC: 2.59 MIL/uL — ABNORMAL LOW (ref 4.22–5.81)
RDW: 13.4 % (ref 11.5–15.5)
WBC: 19.2 10*3/uL — ABNORMAL HIGH (ref 4.0–10.5)
nRBC: 1.1 % — ABNORMAL HIGH (ref 0.0–0.2)
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
## 2021-10-09 LAB — CBC
HCT: 26 % — ABNORMAL LOW (ref 39.0–52.0)
Hemoglobin: 7.9 g/dL — ABNORMAL LOW (ref 13.0–17.0)
MCH: 30.3 pg (ref 26.0–34.0)
MCHC: 30.4 g/dL (ref 30.0–36.0)
MCV: 99.6 fL (ref 80.0–100.0)
Platelets: 470 10*3/uL — ABNORMAL HIGH (ref 150–400)
RBC: 2.61 MIL/uL — ABNORMAL LOW (ref 4.22–5.81)
RDW: 13.5 % (ref 11.5–15.5)
WBC: 18.7 10*3/uL — ABNORMAL HIGH (ref 4.0–10.5)
nRBC: 2.4 % — ABNORMAL HIGH (ref 0.0–0.2)

## 2021-10-09 LAB — BASIC METABOLIC PANEL
Anion gap: 7 (ref 5–15)
BUN: 22 mg/dL — ABNORMAL HIGH (ref 6–20)
CO2: 27 mmol/L (ref 22–32)
Calcium: 9 mg/dL (ref 8.9–10.3)
Chloride: 109 mmol/L (ref 98–111)
Creatinine, Ser: 0.78 mg/dL (ref 0.61–1.24)
GFR, Estimated: >60 mL/min (ref >60)
Glucose, Bld: 203 mg/dL — ABNORMAL HIGH (ref 70–99)
Potassium: 3.8 mmol/L (ref 3.5–5.1)
Sodium: 143 mmol/L (ref 135–145)

## 2021-10-09 LAB — GLUCOSE, CAPILLARY
Glucose-Capillary: 163 mg/dL — ABNORMAL HIGH (ref 70–99)
Glucose-Capillary: 168 mg/dL — ABNORMAL HIGH (ref 70–99)
Glucose-Capillary: 171 mg/dL — ABNORMAL HIGH (ref 70–99)
Glucose-Capillary: 183 mg/dL — ABNORMAL HIGH (ref 70–99)
Glucose-Capillary: 184 mg/dL — ABNORMAL HIGH (ref 70–99)
Glucose-Capillary: 193 mg/dL — ABNORMAL HIGH (ref 70–99)
Glucose-Capillary: 194 mg/dL — ABNORMAL HIGH (ref 70–99)

## 2021-10-09 MED ORDER — VALACYCLOVIR HCL 500 MG PO TABS
1000.0000 mg | ORAL_TABLET | Freq: Three times a day (TID) | ORAL | Status: DC
  Administered 2021-10-09 – 2021-10-14 (×17): 1000 mg
  Filled 2021-10-09 (×19): qty 2

## 2021-10-09 MED ORDER — MUPIROCIN 2 % EX OINT
TOPICAL_OINTMENT | Freq: Two times a day (BID) | CUTANEOUS | Status: DC
  Administered 2021-10-11 – 2021-11-24 (×19): 1 via TOPICAL
  Filled 2021-10-09 (×19): qty 22

## 2021-10-09 NOTE — Progress Notes (Signed)
Subjective: ?Patient reports remains intubated and sedated ? ?Objective: ?Vital signs in last 24 hours: ?Temp:  [99.8 ?F (37.7 ?C)-100.7 ?F (38.2 ?C)] 100.7 ?F (38.2 ?C) (04/12 0400) ?Pulse Rate:  [97-116] 104 (04/12 0841) ?Resp:  [14-25] 18 (04/12 0841) ?BP: (114-152)/(64-88) 138/77 (04/12 0841) ?SpO2:  [97 %-100 %] 99 % (04/12 0841) ?FiO2 (%):  [30 %] 30 % (04/12 0841) ?Weight:  [93.8 kg] 93.8 kg (04/12 0500) ? ?Intake/Output from previous day: ?04/11 0701 - 04/12 0700 ?In: 2015.4 [I.V.:205.4; NG/GT:1610; IV [id number] ?Out: 1500 [Urine:1500] ?Intake/Output this shift: ?No intake/output data recorded. ? ?Pupils are 5 and sluggish positive corneals positive gag nonpurposeful withdrawal left upper extremity some flexion withdrawal right upper extremity minimal movement lower extremities ? ?Lab Results: ?Recent Labs  ?  10/08/21 ?0323 10/09/21 ?0100  ?WBC 19.2* 18.7*  ?HGB 7.9* 7.9*  ?HCT 25.9* 26.0*  ?PLT 440* 470*  ? ?BMET ?Recent Labs  ?  10/09/21 ?0100  ?NA 143  ?K 3.8  ?CL 109  ?CO2 27  ?GLUCOSE 203*  ?BUN 22*  ?CREATININE 0.78  ?CALCIUM 9.0  ? ? ?Studies/Results: ?No results found. ? ?Assessment/Plan: ?I spent about an hour talking with his mother about his condition she is leaning towards proceeding forward with tracheostomy and feeding tube placement.  She will further discuss this with the rest of the family.  I also reviewed his series of 5 CAT scan from admission to most recent with her reviewing neuroanatomy and explaining to her the situation and prognosis to the degree that we could. ? LOS: 14 days  ? ? ? ?Elaina Hoops ?10/09/2021, 10:42 AM ? ? ? ? ?

## 2021-10-09 NOTE — Progress Notes (Addendum)
Patient ID: Sean Bruce, male   DOB: 05-15-1996, 26 y.o.   MRN: [id number] ?Follow up - Trauma Critical Care ?  ?Patient Details:  ?  ?Sean Bruce is an 26 y.o. male. ? ?Lines/tubes ?: ?Airway 8 mm (Active)  ?Secured at (cm) 25 cm 10/09/21 0841  ?Measured From Lips 10/09/21 0841  ?Secured Location Left 10/09/21 0841  ?Secured By [employer] 10/09/21 [phone]  ?Tube Holder Repositioned Yes 10/09/21 0841  ?Prone position No 10/09/21 0841  ?Head position Left 10/03/21 1551  ?Cuff Pressure (cm H2O) Green OR 18-26 [facility] 10/09/21 0841  ?Site Condition Other (Comment) 10/09/21 0841  ?   ?External Urinary Catheter (Active)  ?Geophysicist/field seismologist Dedicated Suction Canister 10/08/21 2000  ?Suction (Verified suction is between 40-80 mmHg) Yes 10/08/21 2000  ?Securement Method Tape 10/08/21 2000  ?Site Assessment Clean, Dry, Intact 10/08/21 2000  ? ? ?Microbiology/Sepsis markers: ?Results for orders placed or performed during the hospital encounter of 09/25/21  ?Resp Panel by RT-PCR (Flu A&B, Covid) Nasopharyngeal Swab     Status: None  ? Collection Time: 09/25/21 11:08 PM  ? Specimen: Nasopharyngeal Swab; Nasopharyngeal(NP) swabs in vial transport medium  ?Result Value Ref Range Status  ? SARS Coronavirus 2 by RT PCR NEGATIVE NEGATIVE Final  ?  Comment: (NOTE) ?SARS-CoV-2 target nucleic acids are NOT DETECTED. ? ?The SARS-CoV-2 RNA is generally detectable in upper respiratory ?specimens during the acute phase of infection. The lowest ?concentration of SARS-CoV-2 viral copies this assay can detect is ?138 copies/mL. A negative result does not preclude SARS-Cov-2 ?infection and should not be used as the sole basis for treatment or ?other patient management decisions. A negative result may occur with  ?improper specimen collection/handling, submission of specimen other ?than nasopharyngeal swab, presence of viral mutation(s) within the ?areas targeted by this assay, and inadequate number of viral ?copies(<138 copies/mL). A  negative result must be combined with ?clinical observations, patient history, and epidemiological ?information. The expected result is Negative. ? ?Fact Sheet for Patients:  ?BloggerCourse.com ? ?Fact Sheet for Healthcare Providers:  ?SeriousBroker.it ? ?This test is no t yet approved or cleared by the [place] FDA and  ?has been authorized for detection and/or diagnosis of SARS-CoV-2 by ?FDA under an Emergency Use Authorization (EUA). This EUA will remain  ?in effect (meaning this test can be used) for the duration of the ?COVID-19 declaration under Section 564(b)(1) of the Act, 21 ?U.S.C.section 360bbb-3(b)(1), unless the authorization is terminated  ?or revoked sooner.  ? ? ?  ? Influenza A by PCR NEGATIVE NEGATIVE Final  ? Influenza B by PCR NEGATIVE NEGATIVE Final  ?  Comment: (NOTE) ?The Xpert Xpress SARS-CoV-2/FLU/RSV plus assay is intended as an aid ?in the diagnosis of influenza from Nasopharyngeal swab specimens and ?should not be used as a sole basis for treatment. Nasal washings and ?aspirates are unacceptable for Xpert Xpress SARS-CoV-2/FLU/RSV ?testing. ? ?Fact Sheet for Patients: ?BloggerCourse.com ? ?Fact Sheet for Healthcare Providers: ?SeriousBroker.it ? ?This test is not yet approved or cleared by the [place] FDA and ?has been authorized for detection and/or diagnosis of SARS-CoV-2 by ?FDA under an Emergency Use Authorization (EUA). This EUA will remain ?in effect (meaning this test can be used) for the duration of the ?COVID-19 declaration under Section 564(b)(1) of the Act, 21 U.S.C. ?section 360bbb-3(b)(1), unless the authorization is terminated or ?revoked. ? ?Performed at [facility]: [facility], [address] [place], [place] ?[address] ?  ?MRSA Next Gen by PCR, Nasal  Status: None  ? Collection Time: 09/26/21  3:36 AM  ? Specimen: Nasal Mucosa; Nasal Swab  ?Result Value Ref Range  Status  ? MRSA by PCR Next Gen NOT DETECTED NOT DETECTED Final  ?  Comment: (NOTE) ?The GeneXpert MRSA Assay (FDA approved for NASAL specimens only), ?is one component of a comprehensive MRSA colonization surveillance ?program. It is not intended to diagnose MRSA infection nor to guide ?or monitor treatment for MRSA infections. ?Test performance is not FDA approved in patients less than 2 years ?old. ?Performed at [facility], [address] [place], [place] ?[address] ?  ?Culture, Respiratory w Gram Stain     Status: None  ? Collection Time: 09/30/21  8:43 AM  ? Specimen: Tracheal Aspirate; Respiratory  ?Result Value Ref Range Status  ? Specimen Description TRACHEAL ASPIRATE  Final  ? Special Requests NONE  Final  ? Gram Stain   Final  ?  MODERATE WBC PRESENT,BOTH PMN AND MONONUCLEAR ?FEW GRAM POSITIVE COCCI IN PAIRS ?RARE GRAM POSITIVE COCCI IN CHAINS ?RARE GRAM NEGATIVE RODS ?  ? Culture   Final  ?  FEW Normal respiratory flora-no Staph aureus or Pseudomonas seen ?Performed at [facility], [address], [place], [place] [address] ?  ? Report Status 10/02/2021 FINAL  Final  ? ? ?Anti-infectives:  ?Anti-infectives (From admission, onward)  ? ? Start     Dose/Rate Route Frequency Ordered Stop  ? 09/30/21 1000  ceFEPIme (MAXIPIME) 2 g in sodium chloride 0.9 % 100 mL IVPB  Status:  Discontinued       ? 2 g ?200 mL/hr over 30 Minutes Intravenous Every 8 hours 09/30/21 0843 10/02/21 1344  ? 09/26/21 1400  ceFAZolin (ANCEF) IVPB 2g/100 mL premix  Status:  Discontinued       ? 2 g ?200 mL/hr over 30 Minutes Intravenous Every 8 hours 09/26/21 1050 09/29/21 0835  ? 09/26/21 0015  ceFAZolin (ANCEF) IVPB 2g/100 mL premix       ? 2 g ?200 mL/hr over 30 Minutes Intravenous  Once 09/26/21 0004 09/26/21 0107  ? ?  ? ? ?Best Practice/Protocols:  ?VTE Prophylaxis: Lovenox (prophylaxtic dose) ?Intermittent Sedation ? ?Consults: ?Treatment Team:  ?Donalee Citrin, MD  ? ? ?Studies: ? ? ? ?Events: ? ?Subjective:  ?   ?Overnight Issues:  ? ?Objective:  ?Vital signs for last 24 hours: ?Temp:  [99.8 ?F (37.7 ?C)-100.7 ?F (38.2 ?C)] 100.7 ?F (38.2 ?C) (04/12 0400) ?Pulse Rate:  [97-116] 104 (04/12 0841) ?Resp:  [14-25] 18 (04/12 0841) ?BP: (114-152)/(64-88) 138/77 (04/12 0841) ?SpO2:  [96 %-100 %] 99 % (04/12 0841) ?FiO2 (%):  [30 %] 30 % (04/12 0841) ?Weight:  [93.8 kg] 93.8 kg (04/12 0500) ? ?Hemodynamic parameters for last 24 hours: ?  ? ?Intake/Output from previous day: ?04/11 0701 - 04/12 0700 ?In: 2015.4 [I.V.:205.4; NG/GT:1610; IV Piggyback:200] ?Out: 1500 [Urine:1500]  ?Intake/Output this shift: ?No intake/output data recorded. ? ?Vent settings for last 24 hours: ?Vent Mode: PSV;CPAP ?FiO2 (%):  [30 %] 30 % ?Set Rate:  [16 bmp] 16 bmp ?Vt Set:  [620 mL] 620 mL ?PEEP:  [5 cmH20] 5 cmH20 ?Pressure Support:  [10 cmH20] 10 cmH20 ?Plateau Pressure:  [13 cmH20-19 cmH20] 19 cmH20 ? ?Physical Exam:  ?General: on vent ?Neuro: pupils 39mm, flexed to pain RUE  ?HEENT/Neck: ETT and crani incision ?Resp: clear to auscultation bilaterally ?CVS: RRR ?GI: soft, NT ?Skin: no rash ?Extremities: edema 1+ ?Skin correction - vesicluar with erythema rash L  knee ?Results for orders placed or performed during the hospital encounter of 09/25/21 (from the past 24 hour(s))  ?Glucose, capillary     Status: Abnormal  ? Collection Time: 10/08/21 11:14 AM  ?Result Value Ref Range  ? Glucose-Capillary 200 (H) 70 - 99 mg/dL  ?Glucose, capillary     Status: Abnormal  ? Collection Time: 10/08/21  2:54 PM  ?Result Value Ref Range  ? Glucose-Capillary 169 (H) 70 - 99 mg/dL  ?Glucose, capillary     Status: Abnormal  ? Collection Time: 10/08/21  7:40 PM  ?Result Value Ref Range  ? Glucose-Capillary 219 (H) 70 - 99 mg/dL  ?Glucose, capillary     Status: Abnormal  ? Collection Time: 10/09/21 12:15 AM  ?Result Value Ref Range  ? Glucose-Capillary 193 (H) 70 - 99 mg/dL  ?CBC     Status: Abnormal  ? Collection Time: 10/09/21  1:00 AM  ?Result Value Ref Range  ? WBC  18.7 (H) 4.0 - 10.5 K/uL  ? RBC 2.61 (L) 4.22 - 5.81 MIL/uL  ? Hemoglobin 7.9 (L) 13.0 - 17.0 g/dL  ? HCT 26.0 (L) 39.0 - 52.0 %  ? MCV 99.6 80.0 - 100.0 fL  ? MCH 30.3 26.0 - 34.0 pg  ? MCHC 30.4 30.0 - 36.0 g/d
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
## 2021-10-10 LAB — GLUCOSE, CAPILLARY
Glucose-Capillary: 132 mg/dL — ABNORMAL HIGH (ref 70–99)
Glucose-Capillary: 134 mg/dL — ABNORMAL HIGH (ref 70–99)
Glucose-Capillary: 145 mg/dL — ABNORMAL HIGH (ref 70–99)
Glucose-Capillary: 153 mg/dL — ABNORMAL HIGH (ref 70–99)
Glucose-Capillary: 157 mg/dL — ABNORMAL HIGH (ref 70–99)
Glucose-Capillary: 166 mg/dL — ABNORMAL HIGH (ref 70–99)

## 2021-10-10 LAB — CBC
HCT: 26 % — ABNORMAL LOW (ref 39.0–52.0)
Hemoglobin: 7.8 g/dL — ABNORMAL LOW (ref 13.0–17.0)
MCH: 30.2 pg (ref 26.0–34.0)
MCHC: 30 g/dL (ref 30.0–36.0)
MCV: 100.8 fL — ABNORMAL HIGH (ref 80.0–100.0)
Platelets: 463 10*3/uL — ABNORMAL HIGH (ref 150–400)
RBC: 2.58 MIL/uL — ABNORMAL LOW (ref 4.22–5.81)
RDW: 13.9 % (ref 11.5–15.5)
WBC: 16 10*3/uL — ABNORMAL HIGH (ref 4.0–10.5)
nRBC: 3.6 % — ABNORMAL HIGH (ref 0.0–0.2)

## 2021-10-10 LAB — BASIC METABOLIC PANEL
Anion gap: 7 (ref 5–15)
BUN: 22 mg/dL — ABNORMAL HIGH (ref 6–20)
CO2: 25 mmol/L (ref 22–32)
Calcium: 9 mg/dL (ref 8.9–10.3)
Chloride: 111 mmol/L (ref 98–111)
Creatinine, Ser: 0.78 mg/dL (ref 0.61–1.24)
GFR, Estimated: >60 mL/min (ref >60)
Glucose, Bld: 134 mg/dL — ABNORMAL HIGH (ref 70–99)
Potassium: 4.1 mmol/L (ref 3.5–5.1)
Sodium: 143 mmol/L (ref 135–145)

## 2021-10-10 LAB — PATHOLOGIST SMEAR REVIEW

## 2021-10-10 MED ORDER — LEVETIRACETAM 100 MG/ML PO SOLN
500.0000 mg | Freq: Two times a day (BID) | ORAL | Status: DC
  Administered 2021-10-10 – 2021-11-25 (×91): 500 mg
  Filled 2021-10-10 (×92): qty 5

## 2021-10-10 NOTE — Progress Notes (Addendum)
Patient ID: Sean Bruce, male   DOB: 1996-01-28, 28 y.o.   MRN: [id number] ?Follow up - Trauma Critical Care ?  ?Patient Details:  ?  ?Sean Bruce is an 26 y.o. male. ? ?Lines/tubes ?: ?Airway 8 mm (Active)  ?Secured at (cm) 25 cm 10/10/21 0804  ?Measured From Lips 10/10/21 0804  ?Secured Location Left 10/10/21 0804  ?Secured By [employer] 10/10/21 0804  ?Tube Holder Repositioned Yes 10/10/21 0804  ?Prone position No 10/10/21 0804  ?Head position Left 10/03/21 1551  ?Cuff Pressure (cm H2O) Green OR 18-26 [facility] 10/10/21 0804  ?Site Condition Dry 10/10/21 0804  ?   ?Urethral Catheter Eli Phillips, RN Latex 16 Fr. (Active)  ?Indication for Insertion or Continuance of Catheter Acute urinary retention (I&O Cath for 24 hrs prior to catheter insertion- Inpatient Only) 10/09/21 2000  ?Site Assessment Clean, Dry, Intact 10/09/21 2000  ?Catheter Maintenance Bag below level of bladder;Catheter secured;Drainage bag/tubing not touching floor;No dependent loops;Insertion date on drainage bag;Seal intact 10/09/21 2000  ?Collection Container Standard drainage bag 10/09/21 2000  ?Securement Method Securing device (Describe) 10/09/21 2000  ?Urinary Catheter Interventions (if applicable) Unclamped [id number] 2000  ? ? ?Microbiology/Sepsis markers: ?Results for orders placed or performed during the hospital encounter of 09/25/21  ?Resp Panel by RT-PCR (Flu A&B, Covid) Nasopharyngeal Swab     Status: None  ? Collection Time: 09/25/21 11:08 PM  ? Specimen: Nasopharyngeal Swab; Nasopharyngeal(NP) swabs in vial transport medium  ?Result Value Ref Range Status  ? SARS Coronavirus 2 by RT PCR NEGATIVE NEGATIVE Final  ?  Comment: (NOTE) ?SARS-CoV-2 target nucleic acids are NOT DETECTED. ? ?The SARS-CoV-2 RNA is generally detectable in upper respiratory ?specimens during the acute phase of infection. The lowest ?concentration of SARS-CoV-2 viral copies this assay can detect is ?138 copies/mL. A negative result does not preclude  SARS-Cov-2 ?infection and should not be used as the sole basis for treatment or ?other patient management decisions. A negative result may occur with  ?improper specimen collection/handling, submission of specimen other ?than nasopharyngeal swab, presence of viral mutation(s) within the ?areas targeted by this assay, and inadequate number of viral ?copies(<138 copies/mL). A negative result must be combined with ?clinical observations, patient history, and epidemiological ?information. The expected result is Negative. ? ?Fact Sheet for Patients:  ?EntrepreneurPulse.com.au ? ?Fact Sheet for Healthcare Providers:  ?IncredibleEmployment.be ? ?This test is no t yet approved or cleared by the [place] FDA and  ?has been authorized for detection and/or diagnosis of SARS-CoV-2 by ?FDA under an Emergency Use Authorization (EUA). This EUA will remain  ?in effect (meaning this test can be used) for the duration of the ?COVID-19 declaration under Section 564(b)(1) of the Act, 21 ?U.S.C.section 360bbb-3(b)(1), unless the authorization is terminated  ?or revoked sooner.  ? ? ?  ? Influenza A by PCR NEGATIVE NEGATIVE Final  ? Influenza B by PCR NEGATIVE NEGATIVE Final  ?  Comment: (NOTE) ?The Xpert Xpress SARS-CoV-2/FLU/RSV plus assay is intended as an aid ?in the diagnosis of influenza from Nasopharyngeal swab specimens and ?should not be used as a sole basis for treatment. Nasal washings and ?aspirates are unacceptable for Xpert Xpress SARS-CoV-2/FLU/RSV ?testing. ? ?Fact Sheet for Patients: ?EntrepreneurPulse.com.au ? ?Fact Sheet for Healthcare Providers: ?IncredibleEmployment.be ? ?This test is not yet approved or cleared by the [place] FDA and ?has been authorized for detection and/or diagnosis of SARS-CoV-2 by ?FDA under an Emergency Use Authorization (EUA). This EUA will remain ?in effect (meaning this test can be  used) for the duration of  the ?COVID-19 declaration under Section 564(b)(1) of the Act, 21 U.S.C. ?section 360bbb-3(b)(1), unless the authorization is terminated or ?revoked. ? ?Performed at [facility], [place] [address] [place], [place] ?[address] ?  ?MRSA Next Gen by PCR, Nasal     Status: None  ? Collection Time: 09/26/21  3:36 AM  ? Specimen: Nasal Mucosa; Nasal Swab  ?Result Value Ref Range Status  ? MRSA by PCR Next Gen NOT DETECTED NOT DETECTED Final  ?  Comment: (NOTE) ?The GeneXpert MRSA Assay (FDA approved for NASAL specimens only), ?is one component of a comprehensive MRSA colonization surveillance ?program. It is not intended to diagnose MRSA infection nor to guide ?or monitor treatment for MRSA infections. ?Test performance is not FDA approved in patients less than 2 years ?old. ?Performed at [facility], [place] [address] [place], [place] ?[address] ?  ?Culture, Respiratory w Gram Stain     Status: None  ? Collection Time: 09/30/21  8:43 AM  ? Specimen: Tracheal Aspirate; Respiratory  ?Result Value Ref Range Status  ? Specimen Description TRACHEAL ASPIRATE  Final  ? Special Requests NONE  Final  ? Gram Stain   Final  ?  MODERATE WBC PRESENT,BOTH PMN AND MONONUCLEAR ?FEW GRAM POSITIVE COCCI IN PAIRS ?RARE GRAM POSITIVE COCCI IN CHAINS ?RARE GRAM NEGATIVE RODS ?  ? Culture   Final  ?  FEW Normal respiratory flora-no Staph aureus or Pseudomonas seen ?Performed at [facility], [place] [address], [place],  [address] ?  ? Report Status 10/02/2021 FINAL  Final  ? ? ?Anti-infectives:  ?Anti-infectives (From admission, onward)  ? ? Start     Dose/Rate Route Frequency Ordered Stop  ? 10/09/21 1015  valACYclovir (VALTREX) tablet 1,000 mg       ? 1,000 mg Per Tube 3 times daily 10/09/21 0916 10/16/21 0959  ? 09/30/21 1000  ceFEPIme (MAXIPIME) 2 g in sodium chloride 0.9 % 100 mL IVPB  Status:  Discontinued       ? 2 g ?200 mL/hr over 30 Minutes Intravenous Every 8 hours 09/30/21 0843 10/02/21 1344  ? 09/26/21 1400   ceFAZolin (ANCEF) IVPB 2g/100 mL premix  Status:  Discontinued       ? 2 g ?200 mL/hr over 30 Minutes Intravenous Every 8 hours 09/26/21 1050 09/29/21 0835  ? 09/26/21 0015  ceFAZolin (ANCEF) IVPB 2g/100 mL premix       ? 2 g ?200 mL/hr over 30 Minutes Intravenous  Once 09/26/21 0004 09/26/21 0107  ? ?  ? ? ?Best Practice/Protocols:  ?VTE Prophylaxis: Lovenox (prophylaxtic dose) ?Intermittent Sedation ? ?Consults: ?Treatment Team:  ?Kary Kos, MD  ? ? ?Studies: ? ? ? ?Events: ? ?Subjective:  ?  ?Overnight Issues:  ? ?Objective:  ?Vital signs for last 24 hours: ?Temp:  [99 ?F (37.2 ?C)-101.3 ?F (38.5 ?C)] 100.9 ?F (38.3 ?C) (04/13 0400) ?Pulse Rate:  [97-113] 111 (04/13 0804) ?Resp:  [14-28] 14 (04/13 0804) ?BP: (117-138)/(62-77) 130/76 (04/13 0600) ?SpO2:  [97 %-100 %] 98 % (04/13 0804) ?FiO2 (%):  [30 %] 30 % (04/13 0804) ?Weight:  [93.8 kg] 93.8 kg (04/13 0300) ? ?Hemodynamic parameters for last 24 hours: ?  ? ?Intake/Output from previous day: ?04/12 0701 - 04/13 0700 ?In: 2322.9 [I.V.:142.9; NG/GT:1680; IV [id number] ?Out: 1400 [Urine:1400]  ?Intake/Output this shift: ?No intake/output data recorded. ? ?Vent settings for last 24 hours: ?Vent Mode: PSV;CPAP ?FiO2 (%):  [30 %] 30 % ?  Set Rate:  [16 bmp] 16 bmp ?Vt Set:  [620 mL] 620 mL ?PEEP:  [5 cmH20] 5 cmH20 ?Pressure Support:  [10 cmH20] 10 cmH20 ?Plateau Pressure:  [16 cmH20-18 cmH20] 16 cmH20 ? ?Physical Exam:  ?General: on vent ?Neuro: flutters eyes to  stim, no other movement, +gag ?HEENT/Neck: ETT and scalp incision CDI ?Resp: clear to auscultation bilaterally ?CVS: RRR ?GI: soft, nontender, BS WNL, no r/g ?Extremities: no edema ? ?Results for orders placed or performed during the hospital encounter of 09/25/21 (from the past 24 hour(s))  ?Glucose, capillary     Status: Abnormal  ? Collection Time: 10/09/21  1:11 PM  ?Result Value Ref Range  ? Glucose-Capillary 194 (H) 70 - 99 mg/dL  ?Glucose, capillary     Status: Abnormal  ? Collection Time: 10/09/21   5:24 PM  ?Result Value Ref Range  ? Glucose-Capillary 168 (H) 70 - 99 mg/dL  ?Glucose, capillary     Status: Abnormal  ? Collection Time: 10/09/21  8:32 PM  ?Result Value Ref Range  ? Glucose-Capillary 163 (

## 2021-10-10 NOTE — Progress Notes (Signed)
Pt placed in PSV/CPAP by RT, pt tolerating well at this time. RN aware, RT will continue to monitor.  ?

## 2021-10-10 NOTE — Progress Notes (Signed)
Subjective: ?Patient is still intubated and sedated ? ?Objective: ?Vital signs in last 24 hours: ?Temp:  [99 ?F (37.2 ?C)-101.3 ?F (38.5 ?C)] 100.9 ?F (38.3 ?C) (04/13 0400) ?Pulse Rate:  [97-113] 101 (04/13 0600) ?Resp:  [16-28] 16 (04/13 0600) ?BP: (117-138)/(62-77) 130/76 (04/13 0600) ?SpO2:  [97 %-100 %] 97 % (04/13 0600) ?FiO2 (%):  [30 %] 30 % (04/13 0100) ?Weight:  [93.8 kg] 93.8 kg (04/13 0300) ? ?Intake/Output from previous day: ?04/12 0701 - 04/13 0700 ?In: 2322.9 [I.V.:142.9; NG/GT:1680; IV Piggyback:500] ?Out: 1400 [Urine:1400] ?Intake/Output this shift: ?No intake/output data recorded. ? ?Pupils sluggish 5, slight withdrawal in LUE, flicker in lower extremities ? ?Lab Results: ?Lab Results  ?Component Value Date  ? WBC 16.0 (H) 10/10/2021  ? HGB 7.8 (L) 10/10/2021  ? HCT 26.0 (L) 10/10/2021  ? MCV 100.8 (H) 10/10/2021  ? PLT 463 (H) 10/10/2021  ? ?Lab Results  ?Component Value Date  ? INR 1.2 09/25/2021  ? ?BMET ?Lab Results  ?Component Value Date  ? NA 143 10/10/2021  ? K 4.1 10/10/2021  ? CL 111 10/10/2021  ? CO2 25 10/10/2021  ? GLUCOSE 134 (H) 10/10/2021  ? BUN 22 (H) 10/10/2021  ? CREATININE 0.78 10/10/2021  ? CALCIUM 9.0 10/10/2021  ? ? ?Studies/Results: ?No results found. ? ?Assessment/Plan: ?Postop craniectomy for CHI.  No new neurosurgical recommendations at this time. Continue supportive care.  ? ? LOS: 15 days  ? ? ?Tiana Loft Johnay Mano ?10/10/2021, 7:59 AM ? ? ?  ?
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
## 2021-10-11 LAB — BASIC METABOLIC PANEL
Anion gap: 10 (ref 5–15)
BUN: 23 mg/dL — ABNORMAL HIGH (ref 6–20)
CO2: 22 mmol/L (ref 22–32)
Calcium: 9.2 mg/dL (ref 8.9–10.3)
Chloride: 109 mmol/L (ref 98–111)
Creatinine, Ser: 0.8 mg/dL (ref 0.61–1.24)
GFR, Estimated: >60 mL/min (ref >60)
Glucose, Bld: 112 mg/dL — ABNORMAL HIGH (ref 70–99)
Potassium: 5 mmol/L (ref 3.5–5.1)
Sodium: 141 mmol/L (ref 135–145)

## 2021-10-11 LAB — CBC
HCT: 25.4 % — ABNORMAL LOW (ref 39.0–52.0)
Hemoglobin: 7.7 g/dL — ABNORMAL LOW (ref 13.0–17.0)
MCH: 30.7 pg (ref 26.0–34.0)
MCHC: 30.3 g/dL (ref 30.0–36.0)
MCV: 101.2 fL — ABNORMAL HIGH (ref 80.0–100.0)
Platelets: 442 10*3/uL — ABNORMAL HIGH (ref 150–400)
RBC: 2.51 MIL/uL — ABNORMAL LOW (ref 4.22–5.81)
RDW: 14.1 % (ref 11.5–15.5)
WBC: 18 10*3/uL — ABNORMAL HIGH (ref 4.0–10.5)
nRBC: 1.6 % — ABNORMAL HIGH (ref 0.0–0.2)

## 2021-10-11 LAB — GLUCOSE, CAPILLARY
Glucose-Capillary: 109 mg/dL — ABNORMAL HIGH (ref 70–99)
Glucose-Capillary: 121 mg/dL — ABNORMAL HIGH (ref 70–99)
Glucose-Capillary: 113 mg/dL — ABNORMAL HIGH (ref 70–99)
Glucose-Capillary: 136 mg/dL — ABNORMAL HIGH (ref 70–99)
Glucose-Capillary: 149 mg/dL — ABNORMAL HIGH (ref 70–99)

## 2021-10-11 MED ORDER — BETHANECHOL CHLORIDE 25 MG PO TABS
25.0000 mg | ORAL_TABLET | Freq: Three times a day (TID) | ORAL | Status: DC
  Administered 2021-10-11 – 2021-11-22 (×122): 25 mg
  Filled 2021-10-11 (×125): qty 1

## 2021-10-11 MED ORDER — SODIUM CHLORIDE 0.9 % IV SOLN
2.0000 g | Freq: Three times a day (TID) | INTRAVENOUS | Status: DC
  Administered 2021-10-11 – 2021-10-13 (×7): 2 g via INTRAVENOUS
  Filled 2021-10-11 (×7): qty 12.5

## 2021-10-11 NOTE — Progress Notes (Signed)
Pharmacy Antibiotic Note ? ?Sean Bruce is a 26 y.o. male admitted on 09/25/2021 with pneumonia.  Pharmacy has been consulted for cefepime dosing. Renal function stable. Tracheal aspirate culture pending. ? ?Plan: ?Cefepime 2g IV q8h ?Monitor clinical progress, c/s, renal function ?F/u de-escalation plan/LOT ? ? ?Height: 6' (182.9 cm) ?Weight: 93.8 kg (206 lb 12.7 oz) ?IBW/kg (Calculated) : 77.6 ? ?Temp (24hrs), Avg:100.1 ?F (37.8 ?C), Min:98.4 ?F (36.9 ?C), Max:101.1 ?F (38.4 ?C) ? ?Recent Labs  ?Lab 10/06/21 ?0507 10/07/21 ?[id number] 10/08/21 ?0323 10/09/21 ?0100 10/10/21 ?[id number] 10/11/21 ?0356  ?WBC 19.4* 19.7* 19.2* 18.7* 16.0* 18.0*  ?CREATININE 0.86  --   --  0.78 0.78 0.80  ?  ?Estimated Creatinine Clearance: 165 mL/min (by C-G formula based on SCr of 0.8 mg/dL).   ? ?No Known Allergies ? ? ?Arturo Morton, PharmD, BCPS ?Please check AMION for all Branch contact numbers ?Clinical Pharmacist ?10/11/2021 11:53 AM ? ?

## 2021-10-11 NOTE — Progress Notes (Signed)
Trauma Rounding Note ? ?Pt does not FC and is still currently intubated due to mental status.  Plan for trach/peg Monday.  Was going to attempt to do today however, Dr. Bobbye Morton could not get in touch with pt's father for consent.  Pt is also exhibiting urinary retention - foley replaced 4/9 - plan to increase urecholine.  Vesicular rash to L knee - suspect shingles although this is an uncommon location.  Treating with Valtrex and topical bactroban.  VZV PCR sent 4/13 and is pending. ? ?Last imported Vital Signs ?BP 104/64   Pulse 82   Temp 97.7 ?F (36.5 ?C)   Resp 15   Ht 6' (1.829 m)   Wt 206 lb 12.7 oz (93.8 kg)   SpO2 98%   BMI 28.05 kg/m?  ? ?Trending CBC ?Recent Labs  ?  10/09/21 ?0100 10/10/21 ?[id number] 10/11/21 ?0356  ?WBC 18.7* 16.0* 18.0*  ?HGB 7.9* 7.8* 7.7*  ?HCT 26.0* 26.0* 25.4*  ?PLT 470* 463* 442*  ? ? ?Trending Coag's ?No results for input(s): APTT, INR in the last 72 hours. ? ?Trending BMET ?Recent Labs  ?  10/09/21 ?0100 10/10/21 ?[id number] 10/11/21 ?0356  ?NA 143 143 141  ?K 3.8 4.1 5.0  ?CL 109 111 109  ?CO2 27 25 22   ?BUN 22* 22* 23*  ?CREATININE 0.78 0.78 0.80  ?GLUCOSE 203* 134* 112*  ? ? ? ? ?Sean Bruce W  ?Trauma Response RN ? ?Please call TRN at [phone] for further assistance. ? ? ?  ?

## 2021-10-11 NOTE — Plan of Care (Signed)
  Problem: Safety: Goal: Ability to remain free from injury will improve Outcome: Progressing   Problem: Skin Integrity: Goal: Risk for impaired skin integrity will decrease Outcome: Progressing   

## 2021-10-11 NOTE — Progress Notes (Signed)
Pt placed back on full vent support due to apnea and low RR. Pt tolerating well, RN aware, RT will continue to monitor.  ?

## 2021-10-11 NOTE — Progress Notes (Signed)
NEUROSURGERY PROGRESS NOTE ? ?Patient continue to be on the ventilator. No change in neurologic status. Continue supportive care.  ? ?Temp:  [97.7 ?F (36.5 ?C)-101.1 ?F (38.4 ?C)] 97.7 ?F (36.5 ?C) (04/14 1400) ?Pulse Rate:  [76-117] 82 (04/14 1400) ?Resp:  [10-27] 15 (04/14 1400) ?BP: (104-130)/(64-82) 104/64 (04/14 1400) ?SpO2:  [98 %-100 %] 98 % (04/14 1400) ?FiO2 (%):  [30 %] 30 % (04/14 1632) ? ? ? ?Sean Manges, NP ?10/11/2021 ?4:38 PM ?  ?
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
## 2021-10-12 LAB — GLUCOSE, CAPILLARY
Glucose-Capillary: 136 mg/dL — ABNORMAL HIGH (ref 70–99)
Glucose-Capillary: 140 mg/dL — ABNORMAL HIGH (ref 70–99)
Glucose-Capillary: 147 mg/dL — ABNORMAL HIGH (ref 70–99)
Glucose-Capillary: 148 mg/dL — ABNORMAL HIGH (ref 70–99)
Glucose-Capillary: 161 mg/dL — ABNORMAL HIGH (ref 70–99)
Glucose-Capillary: 163 mg/dL — ABNORMAL HIGH (ref 70–99)

## 2021-10-12 LAB — CBC
HCT: 27.5 % — ABNORMAL LOW (ref 39.0–52.0)
Hemoglobin: 8.5 g/dL — ABNORMAL LOW (ref 13.0–17.0)
MCH: 30.8 pg (ref 26.0–34.0)
MCHC: 30.9 g/dL (ref 30.0–36.0)
MCV: 99.6 fL (ref 80.0–100.0)
Platelets: 419 10*3/uL — ABNORMAL HIGH (ref 150–400)
RBC: 2.76 MIL/uL — ABNORMAL LOW (ref 4.22–5.81)
RDW: 13.9 % (ref 11.5–15.5)
WBC: 13.1 10*3/uL — ABNORMAL HIGH (ref 4.0–10.5)
nRBC: 1.1 % — ABNORMAL HIGH (ref 0.0–0.2)

## 2021-10-12 LAB — BASIC METABOLIC PANEL
Anion gap: 8 (ref 5–15)
BUN: 25 mg/dL — ABNORMAL HIGH (ref 6–20)
CO2: 25 mmol/L (ref 22–32)
Calcium: 9.1 mg/dL (ref 8.9–10.3)
Chloride: 112 mmol/L — ABNORMAL HIGH (ref 98–111)
Creatinine, Ser: 0.8 mg/dL (ref 0.61–1.24)
GFR, Estimated: >60 mL/min (ref >60)
Glucose, Bld: 162 mg/dL — ABNORMAL HIGH (ref 70–99)
Potassium: 4.4 mmol/L (ref 3.5–5.1)
Sodium: 145 mmol/L (ref 135–145)

## 2021-10-12 MED ORDER — BISACODYL 10 MG RE SUPP
10.0000 mg | Freq: Every day | RECTAL | Status: DC
Start: 2021-10-12 — End: 2021-11-25
  Administered 2021-10-12 – 2021-11-25 (×30): 10 mg via RECTAL
  Filled 2021-10-12 (×37): qty 1

## 2021-10-12 MED ORDER — POLYETHYLENE GLYCOL 3350 17 G PO PACK
17.0000 g | PACK | Freq: Every day | ORAL | Status: DC
  Administered 2021-10-12 – 2021-11-25 (×37): 17 g
  Filled 2021-10-12 (×36): qty 1
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
## 2021-10-12 NOTE — Progress Notes (Signed)
RT note. ?ETT holder replaced at this time. ETT still at 25 @ lips, Lt side. Orange bite block still in good place. Patient achieving vt. RT will continue to monitor.  ?

## 2021-10-12 NOTE — Progress Notes (Signed)
? ?  Palliative Medicine Inpatient Follow Up Note ? ? ?I checked in with patients RN, Cheri Rous this morning. At this time goals remain for full aggressive care. ? ?I shared that PMT will be available should patient decompensate or family decide to change the care path. ? ?No Charge ?______________________________________________________________________________________ ?Sean Bruce ?[place] Team ?Team Cell Phone: [phone] ?Please utilize secure chat with additional questions, if there is no response within 30 minutes please call the above phone number ? ?Palliative Medicine Team providers are available by phone from 7am to 7pm daily and can be reached through the team cell phone.  ?Should this patient require assistance outside of these hours, please call the patient's attending physician. ? ? ? ? ?

## 2021-10-12 NOTE — Progress Notes (Signed)
RT note. ?Patient flipped back to full support at this time due to low RR, less then 10. Patient on PRVC previous settings, resting comfortable. RT will continue to monitor.  ?

## 2021-10-12 NOTE — Progress Notes (Signed)
RT note. ?Patient placed on SBT 10/5 30%. Sat 100% RR 18 , vt 551. Patient resting comfortable. RT will continue to monitor.  ?

## 2021-10-13 LAB — CBC
HCT: 29 % — ABNORMAL LOW (ref 39.0–52.0)
Hemoglobin: 8.7 g/dL — ABNORMAL LOW (ref 13.0–17.0)
MCH: 30.3 pg (ref 26.0–34.0)
MCHC: 30 g/dL (ref 30.0–36.0)
MCV: 101 fL — ABNORMAL HIGH (ref 80.0–100.0)
Platelets: 439 10*3/uL — ABNORMAL HIGH (ref 150–400)
RBC: 2.87 MIL/uL — ABNORMAL LOW (ref 4.22–5.81)
RDW: 13.8 % (ref 11.5–15.5)
WBC: 11.2 10*3/uL — ABNORMAL HIGH (ref 4.0–10.5)
nRBC: 1.4 % — ABNORMAL HIGH (ref 0.0–0.2)

## 2021-10-13 LAB — BASIC METABOLIC PANEL
Anion gap: 7 (ref 5–15)
BUN: 22 mg/dL — ABNORMAL HIGH (ref 6–20)
CO2: 26 mmol/L (ref 22–32)
Calcium: 9.1 mg/dL (ref 8.9–10.3)
Chloride: 109 mmol/L (ref 98–111)
Creatinine, Ser: 0.77 mg/dL (ref 0.61–1.24)
GFR, Estimated: >60 mL/min (ref >60)
Glucose, Bld: 166 mg/dL — ABNORMAL HIGH (ref 70–99)
Potassium: 4.1 mmol/L (ref 3.5–5.1)
Sodium: 142 mmol/L (ref 135–145)

## 2021-10-13 LAB — GLUCOSE, CAPILLARY
Glucose-Capillary: 129 mg/dL — ABNORMAL HIGH (ref 70–99)
Glucose-Capillary: 132 mg/dL — ABNORMAL HIGH (ref 70–99)
Glucose-Capillary: 132 mg/dL — ABNORMAL HIGH (ref 70–99)
Glucose-Capillary: 135 mg/dL — ABNORMAL HIGH (ref 70–99)
Glucose-Capillary: 147 mg/dL — ABNORMAL HIGH (ref 70–99)
Glucose-Capillary: 150 mg/dL — ABNORMAL HIGH (ref 70–99)
Glucose-Capillary: 154 mg/dL — ABNORMAL HIGH (ref 70–99)

## 2021-10-13 MED ORDER — CEFAZOLIN SODIUM-DEXTROSE 2-4 GM/100ML-% IV SOLN
2.0000 g | Freq: Three times a day (TID) | INTRAVENOUS | Status: DC
  Administered 2021-10-13 – 2021-10-16 (×8): 2 g via INTRAVENOUS
  Filled 2021-10-13 (×8): qty 100

## 2021-10-13 NOTE — Progress Notes (Addendum)
? ?  Palliative Medicine Inpatient Follow Up Note ? ? ?I checked in with patients surgeon Sophronia Simas to see if we are still needed for Sean Bruce given that goals are clear towards aggressive care.  ? ?Dr. Freida Busman at this time is in support of [place] signing off. ? ?Reviewed that we are happy to become re-involved if anything changes pertaining to patients clinical condition or goals of care.  ? ?No Charge ?______________________________________________________________________________________ ?Sean Bruce ?[place] Palliative Medicine Team ?Team Cell Phone: [phone] ?Please utilize secure chat with additional questions, if there is no response within 30 minutes please call the above phone number ? ?Palliative Medicine Team providers are available by phone from 7am to 7pm daily and can be reached through the team cell phone.  ?Should this patient require assistance outside of these hours, please call the patient's attending physician. ? ? ? ? ?
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
## 2021-10-13 NOTE — Progress Notes (Signed)
RT note. ?Patient placed on SBT at this time 10/5 30% sat 99%, RR 15-20 , vt 550. Patient resting comfortable, RT will continue to monitor.   ?

## 2021-10-13 NOTE — Progress Notes (Signed)
NEUROSURGERY PROGRESS NOTE ? ?S/p CHI and craniectomy. No acute changes over night. Continue supportive care ? ?Temp:  [98.2 ?F (36.8 ?C)-99.4 ?F (37.4 ?C)] 99.4 ?F (37.4 ?C) (04/16 0800) ?Pulse Rate:  [82-110] 88 (04/16 0807) ?Resp:  [16-23] 16 (04/16 0800) ?BP: (108-139)/(61-93) 139/79 (04/16 0800) ?SpO2:  [99 %-100 %] 100 % (04/16 0807) ?FiO2 (%):  [30 %] 30 % (04/16 0807) ?Weight:  [91.8 kg] 91.8 kg (04/16 0500) ? ? ? ?Eleonore Chiquito, NP ?10/13/2021 ?9:17 AM ?  ?

## 2021-10-13 NOTE — Progress Notes (Signed)
Tracheal aspirate cultures with MSSA, abx changed to cefazolin. ?

## 2021-10-14 ENCOUNTER — Other Ambulatory Visit: Payer: Self-pay

## 2021-10-14 ENCOUNTER — Inpatient Hospital Stay (HOSPITAL_COMMUNITY): Payer: Medicaid Other | Admitting: Certified Registered Nurse Anesthetist

## 2021-10-14 ENCOUNTER — Inpatient Hospital Stay (HOSPITAL_COMMUNITY): Payer: Medicaid Other

## 2021-10-14 ENCOUNTER — Encounter (HOSPITAL_COMMUNITY): Admission: EM | Disposition: A | Discharge status: Skilled Nursing Facility | Payer: Self-pay | Source: Home / Self Care

## 2021-10-14 ENCOUNTER — Encounter (HOSPITAL_COMMUNITY): Payer: Self-pay

## 2021-10-14 DIAGNOSIS — S066XAA Traumatic subarachnoid hemorrhage with loss of consciousness status unknown, initial encounter: Secondary | ICD-10-CM

## 2021-10-14 DIAGNOSIS — D649 Anemia, unspecified: Secondary | ICD-10-CM

## 2021-10-14 DIAGNOSIS — S065XAA Traumatic subdural hemorrhage with loss of consciousness status unknown, initial encounter: Secondary | ICD-10-CM

## 2021-10-14 DIAGNOSIS — R1310 Dysphagia, unspecified: Secondary | ICD-10-CM

## 2021-10-14 HISTORY — PX: PEG PLACEMENT: SHX5437

## 2021-10-14 HISTORY — PX: TRACHEOSTOMY TUBE PLACEMENT: SHX814

## 2021-10-14 LAB — GLUCOSE, CAPILLARY
Glucose-Capillary: 110 mg/dL — ABNORMAL HIGH (ref 70–99)
Glucose-Capillary: 116 mg/dL — ABNORMAL HIGH (ref 70–99)
Glucose-Capillary: 117 mg/dL — ABNORMAL HIGH (ref 70–99)
Glucose-Capillary: 122 mg/dL — ABNORMAL HIGH (ref 70–99)
Glucose-Capillary: 149 mg/dL — ABNORMAL HIGH (ref 70–99)

## 2021-10-14 LAB — BASIC METABOLIC PANEL
Anion gap: 17 — ABNORMAL HIGH (ref 5–15)
BUN: 11 mg/dL (ref 6–20)
CO2: 21 mmol/L — ABNORMAL LOW (ref 22–32)
Calcium: 9.3 mg/dL (ref 8.9–10.3)
Chloride: 107 mmol/L (ref 98–111)
Creatinine, Ser: 1.4 mg/dL — ABNORMAL HIGH (ref 0.61–1.24)
GFR, Estimated: >60 mL/min (ref >60)
Glucose, Bld: 198 mg/dL — ABNORMAL HIGH (ref 70–99)
Potassium: 3.1 mmol/L — ABNORMAL LOW (ref 3.5–5.1)
Sodium: 145 mmol/L (ref 135–145)

## 2021-10-14 LAB — CULTURE, RESPIRATORY W GRAM STAIN

## 2021-10-14 LAB — CBC
HCT: 24.8 % — ABNORMAL LOW (ref 39.0–52.0)
Hemoglobin: 7.6 g/dL — ABNORMAL LOW (ref 13.0–17.0)
MCH: 30.6 pg (ref 26.0–34.0)
MCHC: 30.6 g/dL (ref 30.0–36.0)
MCV: 100 fL (ref 80.0–100.0)
Platelets: 407 10*3/uL — ABNORMAL HIGH (ref 150–400)
RBC: 2.48 MIL/uL — ABNORMAL LOW (ref 4.22–5.81)
RDW: 13.8 % (ref 11.5–15.5)
WBC: 12.8 10*3/uL — ABNORMAL HIGH (ref 4.0–10.5)
nRBC: 1.6 % — ABNORMAL HIGH (ref 0.0–0.2)

## 2021-10-14 LAB — VARICELLA-ZOSTER BY PCR: Varicella-Zoster, PCR: NEGATIVE

## 2021-10-14 SURGERY — CREATION, TRACHEOSTOMY
Anesthesia: General

## 2021-10-14 MED ORDER — FENTANYL CITRATE (PF) 250 MCG/5ML IJ SOLN
INTRAMUSCULAR | Status: AC
  Filled 2021-10-14: qty 5

## 2021-10-14 MED ORDER — PROPOFOL 10 MG/ML IV BOLUS
INTRAVENOUS | Status: AC
  Filled 2021-10-14: qty 20

## 2021-10-14 MED ORDER — PIVOT 1.5 CAL PO LIQD
1000.0000 mL | ORAL | Status: DC
  Administered 2021-10-14 – 2021-10-30 (×18): 1000 mL
  Filled 2021-10-14 (×25): qty 1000

## 2021-10-14 MED ORDER — FENTANYL CITRATE (PF) 250 MCG/5ML IJ SOLN
INTRAMUSCULAR | Status: DC | PRN
  Administered 2021-10-14 (×3): 50 ug via INTRAVENOUS

## 2021-10-14 MED ORDER — LIDOCAINE HCL (PF) 1 % IJ SOLN
INTRAMUSCULAR | Status: DC | PRN
  Administered 2021-10-14: 4.5 mL

## 2021-10-14 MED ORDER — 0.9 % SODIUM CHLORIDE (POUR BTL) OPTIME
TOPICAL | Status: DC | PRN
  Administered 2021-10-14: 1000 mL

## 2021-10-14 MED ORDER — MIDAZOLAM HCL 2 MG/2ML IJ SOLN
INTRAMUSCULAR | Status: DC | PRN
  Administered 2021-10-14: 2 mg via INTRAVENOUS

## 2021-10-14 MED ORDER — STERILE WATER FOR IRRIGATION IR SOLN
Status: DC | PRN
  Administered 2021-10-14: 1000 mL

## 2021-10-14 MED ORDER — MIDAZOLAM HCL 2 MG/2ML IJ SOLN
INTRAMUSCULAR | Status: AC
  Filled 2021-10-14: qty 2

## 2021-10-14 MED ORDER — ROCURONIUM BROMIDE 10 MG/ML (PF) SYRINGE
PREFILLED_SYRINGE | INTRAVENOUS | Status: DC | PRN
  Administered 2021-10-14 (×2): 30 mg via INTRAVENOUS

## 2021-10-14 MED ORDER — LACTATED RINGERS IV SOLN: INTRAVENOUS | Status: DC | PRN

## 2021-10-14 MED ORDER — MIDAZOLAM HCL 2 MG/2ML IJ SOLN
INTRAMUSCULAR | Status: AC
Start: 2021-10-14 — End: ?
  Filled 2021-10-14: qty 2

## 2021-10-14 SURGICAL SUPPLY — 46 items
BAG COUNTER SPONGE SURGICOUNT (BAG) ×2 IMPLANT
BAG SPNG CNTER NS LX DISP (BAG) ×1
BINDER ABDOMINAL 12 ML 46-62 (SOFTGOODS) ×1 IMPLANT
BINDER ABDOMINAL 12 XL 75-84 (SOFTGOODS) ×1 IMPLANT
BLADE CLIPPER SURG (BLADE) IMPLANT
BLOCK BITE 60FR ADLT L/F BLUE (MISCELLANEOUS) ×2 IMPLANT
BUTTON OLYMPUS DEFENDO 5 PIECE (MISCELLANEOUS) ×2 IMPLANT
CANISTER SUCT 3000ML PPV (MISCELLANEOUS) ×2 IMPLANT
COVER SURGICAL LIGHT HANDLE (MISCELLANEOUS) ×2 IMPLANT
DRAPE UTILITY XL STRL (DRAPES) ×2 IMPLANT
DRSG TEGADERM 4X4.75 (GAUZE/BANDAGES/DRESSINGS) ×1 IMPLANT
DRSG TELFA 3X8 NADH (GAUZE/BANDAGES/DRESSINGS) IMPLANT
ELECT CAUTERY BLADE 6.4 (BLADE) ×2 IMPLANT
ELECT REM PT RETURN 9FT ADLT (ELECTROSURGICAL) ×2
ELECTRODE REM PT RTRN 9FT ADLT (ELECTROSURGICAL) ×1 IMPLANT
GAUZE 4X4 16PLY ~~LOC~~+RFID DBL (SPONGE) ×2 IMPLANT
GLOVE BIO SURGEON STRL SZ 6.5 (GLOVE) ×2 IMPLANT
GLOVE BIOGEL PI IND STRL 6 (GLOVE) ×1 IMPLANT
GLOVE BIOGEL PI INDICATOR 6 (GLOVE) ×1
GOWN STRL REUS W/ TWL LRG LVL3 (GOWN DISPOSABLE) ×2 IMPLANT
GOWN STRL REUS W/TWL LRG LVL3 (GOWN DISPOSABLE) ×4
INTRODUCER TRACH BLUE RHINO 6F (TUBING) IMPLANT
INTRODUCER TRACH BLUE RHINO 8F (TUBING) IMPLANT
KIT BASIN OR (CUSTOM PROCEDURE TRAY) ×2 IMPLANT
KIT CLEAN ENDO COMPLIANCE (KITS) ×2 IMPLANT
KIT TURNOVER KIT B (KITS) ×2 IMPLANT
NS IRRIG 1000ML POUR BTL (IV SOLUTION) ×2 IMPLANT
PACK EENT II TURBAN DRAPE (CUSTOM PROCEDURE TRAY) ×2 IMPLANT
PAD ARMBOARD 7.5X6 YLW CONV (MISCELLANEOUS) ×4 IMPLANT
PAD DRESSING TELFA 3X8 NADH (GAUZE/BANDAGES/DRESSINGS) ×1 IMPLANT
PENCIL BUTTON HOLSTER BLD 10FT (ELECTRODE) ×2 IMPLANT
SPONGE INTESTINAL PEANUT (DISPOSABLE) ×2 IMPLANT
SUT SILK 2 0 SH (SUTURE) IMPLANT
SUT VIC AB 2-0 SH 27 (SUTURE)
SUT VIC AB 2-0 SH 27XBRD (SUTURE) IMPLANT
SUT VICRYL AB 3 0 TIES (SUTURE) ×2 IMPLANT
SYR 20ML LL LF (SYRINGE) ×2 IMPLANT
TOWEL GREEN STERILE (TOWEL DISPOSABLE) ×2 IMPLANT
TOWEL GREEN STERILE FF (TOWEL DISPOSABLE) ×2 IMPLANT
TRAY W/TRACH TUBE 7.5 (MISCELLANEOUS) IMPLANT
TUBE CONNECTING 12X1/4 (SUCTIONS) ×2 IMPLANT
TUBE ENDOVIVE SAFETY PEG 24 (TUBING) ×1 IMPLANT
TUBE TRACH  6.0 CUFF FLEX (MISCELLANEOUS) ×2
TUBE TRACH 6.0 CUFF FLEX (MISCELLANEOUS) IMPLANT
TUBING ENDO SMARTCAP (MISCELLANEOUS) ×2 IMPLANT
WATER STERILE IRR 1000ML POUR (IV SOLUTION) ×2 IMPLANT
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
## 2021-10-14 NOTE — Progress Notes (Signed)
Patient arrived back from OR and placed back on the vent at full support settings. ?

## 2021-10-14 NOTE — Op Note (Signed)
? ?Operative Note ? ?Date: 10/14/2021 ? ?Procedure: percutaneous tracheostomy without bronchoscopic assistance, esophagogastroduodenoscopy (EGD) and percutaneous endoscopic gastrostomy (PEG) tube placement ? ?Pre-op diagnosis: prolonged mechanical ventilation, dysphagia ?Post-op diagnosis: same ? ?Indication and clinical history: The patient  is a 26 y.o. year old male with prolonged mechanical ventilation and dysphagia ? ?Surgeon: Jesusita Oka, MD ?Assistant: Wynetta Emery, [place] ? ?Anesthesia: general ? ?Findings:  ?Specimen: none ?EBL: <5cc  ?Drains/Implants: #6 Shiley cuffed  tracheostomy tube, PEG tube, 4cm at the skin  ? ?Disposition: ICU ? ?Description of procedure: The patient was positioned supine with a shoulder roll. Time-out was performed verifying correct patient, procedure, signature of informed consent, and pre-operative antibiotics as indicated. MAC induction was uneventful and the patient was confirmed to be on 100% FiO2. The neck was prepped and draped in the usual sterile fashion. Palpation of neck anatomy was performed. A longitudinal incision was made in the neck and deepened down to the trachea.  ? ?The pilot balloon was deflated and the endotracheal tube slowly retracted proximally until the tip of the endotracheal tube was palpated just below the cricoid cartilage. An introducer needle was inserted between the second and third tracheal rings. A guidewire was passed through the introducer needle and the needle removed. Serial dilation was performed and a #6   cuffed Shiley and stylet were inserted over the guidewire and the guidewire and stylet removed. The inner cannula was inserted, the pilot balloon on the tracheostomy inflated, and the ventilator tubing disconnected from the endotracheal tube and connected to the tracheostomy. Chest rise, appropriate end-tidal CO2, and return tidal volumes were confirmed. The tracheostomy tube was sutured in four quadrants and a tracheostomy tie applied to the  neck. The endotracheal tube was removed. The patient tolerated the procedure well. There were no complications. Post-procedure chest x-ray was ordered to confirm tube position and the absence of a pneumothorax. ? ?Next, a bite block was placed into the oropharynx. The endoscope was inserted into the oropharynx and advanced down the esophagus into the stomach and into the duodenum. The visualized esophagus and duodenum were unremarkable. The endoscope was retracted back into the stomach and the stomach was insufflated. The stomach was inspected and was also normal. Transillumination was performed. The light was visible on the external skin and dimpling of the stomach was noted endoscopically with manual pressure. The abdomen was prepped and draped in the usual sterile fashion. Transillumination and dimpling were repeated and local anesthetic was infiltrated to make a skin wheal at the site of transillumination. The needle was inserted perpendicularly to the skin and the tip of the needle was visualized endoscopically. As the needle was retracted, the tract was also anesthetized. A skin nick was made at the site of the wheal and an introducer needle and sheath were inserted. The needle was removed and guidewire inserted. The guidewire was grasped by an endoscopic snare and the snare, guidewire, and endoscope retracted out of the oropharynx. The PEG tube was secured to the guidewire and retracted through the mouth and esophagus into the stomach. The PEG tube was secured with a bolster and was visualized endoscopically to spin freely circumferentially and also be without gaps between the internal bumper and the stomach wall. There was no evidence of bleeding. The PEG bolster was secured at 4cm at the skin and there were no gaps between the bolster and the abdominal wall. The stomach was desufflated endoscopically and the endoscope removed. The bite block was also removed. The patient tolerated the  procedure well and  there were no complications.  ? ?The patient may have water and medications administered via the PEG tube beginning immediately and tube feeds may be initiated four hours post-procedure.  ? ? ?Jesusita Oka, MD ?General and Trauma Surgery ?[facility] ? ?

## 2021-10-15 LAB — GLUCOSE, CAPILLARY
Glucose-Capillary: 127 mg/dL — ABNORMAL HIGH (ref 70–99)
Glucose-Capillary: 140 mg/dL — ABNORMAL HIGH (ref 70–99)
Glucose-Capillary: 146 mg/dL — ABNORMAL HIGH (ref 70–99)
Glucose-Capillary: 138 mg/dL — ABNORMAL HIGH (ref 70–99)
Glucose-Capillary: 126 mg/dL — ABNORMAL HIGH (ref 70–99)
Glucose-Capillary: 131 mg/dL — ABNORMAL HIGH (ref 70–99)

## 2021-10-15 NOTE — Anesthesia Postprocedure Evaluation (Signed)
Anesthesia Post Note ? ?Patient: Sean Bruce ? ?Procedure(s) Performed: TRACHEOSTOMY ?PERCUTANEOUS ENDOSCOPIC GASTROSTOMY (PEG) PLACEMENT ? ?  ? ?Patient location during evaluation: SICU ?Anesthesia Type: General ?Level of consciousness: sedated ?Pain management: pain level controlled ?Vital Signs Assessment: post-procedure vital signs reviewed and stable ?Respiratory status: patient remains intubated per anesthesia plan ?Cardiovascular status: stable ?Postop Assessment: no apparent nausea or vomiting ?Anesthetic complications: no ? ? ?No notable events documented. ? ?Last Vitals:  ?Vitals:  ? 10/15/21 0500 10/15/21 0600  ?BP: 118/68 116/65  ?Pulse: 95 85  ?Resp: 20 16  ?Temp:    ?SpO2: 96% 97%  ?  ?Last Pain:  ?Vitals:  ? 10/15/21 0400  ?TempSrc: Axillary  ?PainSc:   ? ? ?  ?  ?  ?  ?  ?  ? ?Nelle Don Athalene Kolle ? ? ? ? ?

## 2021-10-15 NOTE — Transfer of Care (Signed)
Immediate Anesthesia Transfer of Care Note ? ?Patient: Sean Bruce ? ?Procedure(s) Performed: TRACHEOSTOMY ?PERCUTANEOUS ENDOSCOPIC GASTROSTOMY (PEG) PLACEMENT ? ?Patient Location: PACU and ICU ? ?Anesthesia Type:General ? ?Level of Consciousness: sedated ? ?Airway & Oxygen Therapy: Patient connected to tracheostomy mask oxygen ? ?Post-op Assessment: Post -op Vital signs reviewed and stable ? ?Post vital signs: Reviewed ? ?Last Vitals:  ?Vitals Value Taken Time  ?BP 103/56 10/15/21 0700  ?Temp 37.9 ?C 10/15/21 0400  ?Pulse 85 10/15/21 0703  ?Resp 16 10/15/21 0703  ?SpO2 97 % 10/15/21 0703  ?Vitals shown include unvalidated device data. ? ?Last Pain:  ?Vitals:  ? 10/15/21 0400  ?TempSrc: Axillary  ?PainSc:   ?   ? ?  ? ?Complications: No notable events documented. ?

## 2021-10-15 NOTE — Progress Notes (Signed)
Patient ID: Sean Bruce, male   DOB: Dec 11, 1995, 26 y.o.   MRN: [id number] ?Not much change clinically.  Pupils sluggishly reactive opens eyes minimal movement to noxious stimulation incision clean dry and intact ? ?Continue staples for couple more days DC staples Thursday Friday ?
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
## 2021-10-16 ENCOUNTER — Encounter (HOSPITAL_COMMUNITY): Payer: Self-pay | Admitting: Surgery

## 2021-10-16 LAB — BASIC METABOLIC PANEL
Anion gap: 7 (ref 5–15)
BUN: 18 mg/dL (ref 6–20)
CO2: 25 mmol/L (ref 22–32)
Calcium: 9.1 mg/dL (ref 8.9–10.3)
Chloride: 107 mmol/L (ref 98–111)
Creatinine, Ser: 0.77 mg/dL (ref 0.61–1.24)
GFR, Estimated: >60 mL/min (ref >60)
Glucose, Bld: 133 mg/dL — ABNORMAL HIGH (ref 70–99)
Potassium: 4.2 mmol/L (ref 3.5–5.1)
Sodium: 139 mmol/L (ref 135–145)

## 2021-10-16 LAB — CBC
HCT: 27.9 % — ABNORMAL LOW (ref 39.0–52.0)
Hemoglobin: 8.6 g/dL — ABNORMAL LOW (ref 13.0–17.0)
MCH: 30.7 pg (ref 26.0–34.0)
MCHC: 30.8 g/dL (ref 30.0–36.0)
MCV: 99.6 fL (ref 80.0–100.0)
Platelets: 440 10*3/uL — ABNORMAL HIGH (ref 150–400)
RBC: 2.8 MIL/uL — ABNORMAL LOW (ref 4.22–5.81)
RDW: 14.3 % (ref 11.5–15.5)
WBC: 16.2 10*3/uL — ABNORMAL HIGH (ref 4.0–10.5)
nRBC: 0.5 % — ABNORMAL HIGH (ref 0.0–0.2)

## 2021-10-16 LAB — GLUCOSE, CAPILLARY
Glucose-Capillary: 150 mg/dL — ABNORMAL HIGH (ref 70–99)
Glucose-Capillary: 157 mg/dL — ABNORMAL HIGH (ref 70–99)
Glucose-Capillary: 143 mg/dL — ABNORMAL HIGH (ref 70–99)
Glucose-Capillary: 162 mg/dL — ABNORMAL HIGH (ref 70–99)
Glucose-Capillary: 129 mg/dL — ABNORMAL HIGH (ref 70–99)
Glucose-Capillary: 147 mg/dL — ABNORMAL HIGH (ref 70–99)

## 2021-10-16 MED ORDER — CEFAZOLIN SODIUM-DEXTROSE 2-4 GM/100ML-% IV SOLN
2.0000 g | Freq: Three times a day (TID) | INTRAVENOUS | Status: AC
  Administered 2021-10-16 – 2021-10-17 (×5): 2 g via INTRAVENOUS
  Filled 2021-10-16 (×5): qty 100

## 2021-10-16 MED ORDER — SILVER NITRATE-POT NITRATE 75-25 % EX MISC
1.0000 "application " | Freq: Once | CUTANEOUS | Status: AC
  Administered 2021-10-16: 1 via TOPICAL
  Filled 2021-10-16: qty 1

## 2021-10-16 NOTE — Progress Notes (Signed)
Trauma Rounding Note ? ?TRN rounded on pt and spoke with pt's RN, Candise Bowens C for the day.  Pt has been on trach collar since approximately 8 this morning and is tolerated well.  Pt just got trach/peg on Monday, 4/17.  PEG site bled this morning; Dr. Bedelia Person came to bedside and placed surgicel and silver nitrate to the area.  Bleeding now subsided.  PT/OT/SLP to work with pt and possible tx to 4NP on 4/20. ? ?Last imported Vital Signs ?BP 127/86   Pulse 85   Temp 98.3 ?F (36.8 ?C) (Axillary)   Resp 18   Ht 6' (1.829 m)   Wt 197 lb 5 oz (89.5 kg) Comment: 1 bath blanket, 1 pillow, 1 sheet  SpO2 97%   BMI 26.76 kg/m?  ? ?Trending CBC ?Recent Labs  ?  10/14/21 ?0865 10/16/21 ?0408  ?WBC 12.8* 16.2*  ?HGB 7.6* 8.6*  ?HCT 24.8* 27.9*  ?PLT 407* 440*  ? ? ?Trending Coag's ?No results for input(s): APTT, INR in the last 72 hours. ? ?Trending BMET ?Recent Labs  ?  10/14/21 ?0320 10/16/21 ?0408  ?NA 145 139  ?K 3.1* 4.2  ?CL 107 107  ?CO2 21* 25  ?BUN 11 18  ?CREATININE 1.40* 0.77  ?GLUCOSE 198* 133*  ? ? ? ?Leni Pankonin W  ?Trauma Response RN ? ?Please call TRN at [phone] for further assistance. ? ? ?  ?

## 2021-10-16 NOTE — Progress Notes (Addendum)
Upon shift assessment, shadowing noted on gown. Assessed abdomen and found abdominal binder saturated with blood and saturated split gauze around PEG site. Dr. Bedelia Person notified. Tube feed turned off and checked for residuals (15 ml of tube feed pulled back, no blood noted within tube feed), PEG site cleaned and slow oozing of blood noted from left lower corner of PEG site. New split gauze applied to monitor. VSS ?
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
## 2021-10-17 LAB — GLUCOSE, CAPILLARY
Glucose-Capillary: 117 mg/dL — ABNORMAL HIGH (ref 70–99)
Glucose-Capillary: 122 mg/dL — ABNORMAL HIGH (ref 70–99)
Glucose-Capillary: 129 mg/dL — ABNORMAL HIGH (ref 70–99)
Glucose-Capillary: 141 mg/dL — ABNORMAL HIGH (ref 70–99)
Glucose-Capillary: 155 mg/dL — ABNORMAL HIGH (ref 70–99)
Glucose-Capillary: 163 mg/dL — ABNORMAL HIGH (ref 70–99)

## 2021-10-17 MED ORDER — ORAL CARE MOUTH RINSE
15.0000 mL | Freq: Two times a day (BID) | OROMUCOSAL | Status: DC
  Administered 2021-10-17 – 2021-11-25 (×74): 15 mL via OROMUCOSAL

## 2021-10-17 MED ORDER — CHLORHEXIDINE GLUCONATE 0.12 % MT SOLN
15.0000 mL | Freq: Two times a day (BID) | OROMUCOSAL | Status: DC
  Administered 2021-10-17 – 2021-11-25 (×78): 15 mL via OROMUCOSAL
  Filled 2021-10-17 (×71): qty 15

## 2021-10-17 NOTE — Progress Notes (Signed)
Patient ID: Sean Bruce, male   DOB: 13-Feb-1996, 26 y.o.   MRN: [id number] ?No much change neurosurgically.  Pupils sluggish tolerating a trach collar minimal flexion upper extremities bilaterally significant decrease in swelling over craniotomy defect. ? ?Continue aggressive supportive care no new neurosurgical recommendations we will remove staples today ?
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
## 2021-10-17 NOTE — Progress Notes (Signed)
Patient ID: Sean Bruce, male   DOB: [birth year], 26 y.o.   MRN: [id number] ?Follow up - Trauma Critical Care ?  ?Patient Details:  ?  ?Sean Bruce is an 26 y.o. male. ? ?Lines/tubes ?: ?Gastrostomy/Enterostomy PEG-jejunostomy;Percutaneous endoscopic gastrostomy (PEG) 24 Fr. LUQ (Active)  ?Surrounding Skin Intact 10/16/21 2000  ?Tube Status Patent 10/16/21 2000  ?Drainage Appearance None 10/16/21 2000  ?Dressing Status New drainage 10/16/21 0800  ?Dressing Intervention New dressing 10/16/21 1100  ?Dressing Type Abdominal Binder;Other (Comment) 10/16/21 2000  ?G Port Intake (mL) 21 ml 10/17/21 0500  ?Output (mL) 15 mL 10/16/21 0800  ?   ?External Urinary Catheter (Active)  ?Geophysicist/field seismologist Dedicated Suction Canister 10/16/21 2000  ?Suction (Verified suction is between 40-80 mmHg) Yes 10/16/21 2000  ?Securement Method Securing device (Describe) 10/16/21 2000  ?Site Assessment Clean, Dry, Intact 10/16/21 2000  ?Intervention Male External Urinary Catheter Replaced 10/16/21 1100  ?Output (mL) 400 mL 10/17/21 0500  ? ? ?Microbiology/Sepsis markers: ?Results for orders placed or performed during the hospital encounter of 09/25/21  ?Resp Panel by RT-PCR (Flu A&B, Covid) Nasopharyngeal Swab     Status: None  ? Collection Time: 09/25/21 11:08 PM  ? Specimen: Nasopharyngeal Swab; Nasopharyngeal(NP) swabs in vial transport medium  ?Result Value Ref Range Status  ? SARS Coronavirus 2 by RT PCR NEGATIVE NEGATIVE Final  ?  Comment: (NOTE) ?SARS-CoV-2 target nucleic acids are NOT DETECTED. ? ?The SARS-CoV-2 RNA is generally detectable in upper respiratory ?specimens during the acute phase of infection. The lowest ?concentration of SARS-CoV-2 viral copies this assay can detect is ?138 copies/mL. A negative result does not preclude SARS-Cov-2 ?infection and should not be used as the sole basis for treatment or ?other patient management decisions. A negative result may occur with  ?improper specimen collection/handling, submission  of specimen other ?than nasopharyngeal swab, presence of viral mutation(s) within the ?areas targeted by this assay, and inadequate number of viral ?copies(<138 copies/mL). A negative result must be combined with ?clinical observations, patient history, and epidemiological ?information. The expected result is Negative. ? ?Fact Sheet for Patients:  ?BloggerCourse.com ? ?Fact Sheet for Healthcare Providers:  ?SeriousBroker.it ? ?This test is no t yet approved or cleared by the [place] FDA and  ?has been authorized for detection and/or diagnosis of SARS-CoV-2 by ?FDA under an Emergency Use Authorization (EUA). This EUA will remain  ?in effect (meaning this test can be used) for the duration of the ?COVID-19 declaration under Section 564(b)(1) of the Act, 21 ?U.S.C.section 360bbb-3(b)(1), unless the authorization is terminated  ?or revoked sooner.  ? ? ?  ? Influenza A by PCR NEGATIVE NEGATIVE Final  ? Influenza B by PCR NEGATIVE NEGATIVE Final  ?  Comment: (NOTE) ?The Xpert Xpress SARS-CoV-2/FLU/RSV plus assay is intended as an aid ?in the diagnosis of influenza from Nasopharyngeal swab specimens and ?should not be used as a sole basis for treatment. Nasal washings and ?aspirates are unacceptable for Xpert Xpress SARS-CoV-2/FLU/RSV ?testing. ? ?Fact Sheet for Patients: ?BloggerCourse.com ? ?Fact Sheet for Healthcare Providers: ?SeriousBroker.it ? ?This test is not yet approved or cleared by the [place] FDA and ?has been authorized for detection and/or diagnosis of SARS-CoV-2 by ?FDA under an Emergency Use Authorization (EUA). This EUA will remain ?in effect (meaning this test can be used) for the duration of the ?COVID-19 declaration under Section 564(b)(1) of the Act, 21 U.S.C. ?section 360bbb-3(b)(1), unless the authorization is terminated or ?revoked. ? ?Performed at [facility], [address],  [place], [place] [address] ?  ?MRSA Next Gen by PCR, Nasal     Status: None  ? Collection Time: 09/26/21  3:36 AM  ? Specimen: Nasal Mucosa; Nasal Swab  ?Result Value Ref Range Status  ? MRSA by PCR Next Gen NOT DETECTED NOT DETECTED Final  ?  Comment: (NOTE) ?The GeneXpert MRSA Assay (FDA approved for NASAL specimens only), ?is one component of a comprehensive MRSA colonization surveillance ?program. It is not intended to diagnose MRSA infection nor to guide ?or monitor treatment for MRSA infections. ?Test performance is not FDA approved in patients less than 2 years ?old. ?Performed at [facility], [address] [place], [place] ?[address] ?  ?Culture, Respiratory w Gram Stain     Status: None  ? Collection Time: 09/30/21  8:43 AM  ? Specimen: Tracheal Aspirate; Respiratory  ?Result Value Ref Range Status  ? Specimen Description TRACHEAL ASPIRATE  Final  ? Special Requests NONE  Final  ? Gram Stain   Final  ?  MODERATE WBC PRESENT,BOTH PMN AND MONONUCLEAR ?FEW GRAM POSITIVE COCCI IN PAIRS ?RARE GRAM POSITIVE COCCI IN CHAINS ?RARE GRAM NEGATIVE RODS ?  ? Culture   Final  ?  FEW Normal respiratory flora-no Staph aureus or Pseudomonas seen ?Performed at [facility], [address] [place], [place] [address] ?  ? Report Status 10/02/2021 FINAL  Final  ?Varicella-zoster by PCR     Status: None  ? Collection Time: 10/10/21  9:16 AM  ? Specimen: KNEE; Sterile Swab  ?Result Value Ref Range Status  ? Varicella-Zoster, PCR Negative Negative Final  ?  Comment: (NOTE) ?No Varicella Zoster Virus DNA detected. ?This test was developed and its performance characteristics ?determined by [facility].  It has not been cleared or approved by the ?Food and Drug Administration.  The FDA has determined that such ?clearance or approval is not necessary. ?Performed At: [facility] ?[address] [place], [place] [id number] ?Jolene SchimkeNagendra Sanjai MD OZ:[id number]:[phone] ?  ?Culture, Respiratory w Gram Stain     Status: None  ?  Collection Time: 10/10/21 11:27 AM  ? Specimen: Tracheal Aspirate; Respiratory  ?Result Value Ref Range Status  ? Specimen Description TRACHEAL ASPIRATE  Final  ? Special Requests NONE  Final  ? Gram Stain   Final  ?  FEW WBC PRESENT, PREDOMINANTLY PMN ?ABUNDANT GRAM POSITIVE RODS ?Performed at [facility], [address] [place], [place] [address] ?  ? Culture FEW STAPHYLOCOCCUS AUREUS  Final  ? Report Status 10/14/2021 FINAL  Final  ? Organism ID, Bacteria STAPHYLOCOCCUS AUREUS  Final  ?    Susceptibility  ? Staphylococcus aureus - MIC*  ?  CIPROFLOXACIN <=0.5 SENSITIVE Sensitive   ?  ERYTHROMYCIN <=0.25 SENSITIVE Sensitive   ?  GENTAMICIN <=0.5 SENSITIVE Sensitive   ?  OXACILLIN <=0.25 SENSITIVE Sensitive   ?  TETRACYCLINE <=1 SENSITIVE Sensitive   ?  VANCOMYCIN 1 SENSITIVE Sensitive   ?  TRIMETH/SULFA <=10 SENSITIVE Sensitive   ?  CLINDAMYCIN <=0.25 SENSITIVE Sensitive   ?  RIFAMPIN <=0.5 SENSITIVE Sensitive   ?  Inducible Clindamycin NEGATIVE Sensitive   ?  * FEW STAPHYLOCOCCUS AUREUS  ? ? ?Anti-infectives:  ?Anti-infectives (From admission, onward)  ? ? Start     Dose/Rate Route Frequency Ordered Stop  ? 10/16/21 1400  ceFAZolin (ANCEF) IVPB 2g/100 mL premix       ?Note to Pharmacy: Duration 7d including cefepime  ? 2 g ?200 mL/hr over 30 Minutes  Intravenous Every 8 hours 10/16/21 1054 10/18/21 0559  ? 10/13/21 2000  ceFAZolin (ANCEF) IVPB 2g/100 mL premix  Status:  Discontinued       ? 2 g ?200 mL/hr over 30 Minutes Intravenous Every 8 hours 10/13/21 1451 10/16/21 1054  ? 10/11/21 1245  ceFEPIme (MAXIPIME) 2 g in sodium chloride 0.9 % 100 mL IVPB  Status:  Discontinued       ? 2 g ?200 mL/hr over 30 Minutes Intravenous Every 8 hours 10/11/21 1152 10/13/21 1451  ? 10/09/21 1015  valACYclovir (VALTREX) tablet 1,000 mg  Status:  Discontinued       ? 1,000 mg Per Tube 3 times daily 10/09/21 0916 10/15/21 1029  ? 09/30/21 1000  ceFEPIme (MAXIPIME) 2 g in sodium chloride 0.9 % 100 mL IVPB  Status:   Discontinued       ? 2 g ?200 mL/hr over 30 Minutes Intravenous Every 8 hours 09/30/21 0843 10/02/21 1344  ? 09/26/21 1400  ceFAZolin (ANCEF) IVPB 2g/100 mL premix  Status:  Discontinued       ? 2 g ?200 mL/hr over

## 2021-10-17 NOTE — TOC Progression Note (Signed)
Transition of Care (TOC) - Progression Note  ? ? ?Patient Details  ?Name: Sean Bruce ?MRN: [id number] ?Date of Birth: June 30, 1996 ? ?Transition of Care (TOC) CM/SW Contact  ?Glennon Mac, RN ?Phone Number: ?10/17/2021, 1130am ? ?Clinical Narrative:    ?Long conversation with patient's mother at bedside.  We discussed patient's progress with weaning to trach collar, and she is very pleased with this.  We talked about options for disposition when patient is ready to leave for the hospital.  Patient will likely need SNF placement and 24h nursing care at discharge, and mom was anticipating this.  She is agreeable to patient information being sent to potential facilities, as patient reaches medical readiness for discharge.  Explained that most facilities require that trach be 56 days old or more, and that placement may be limited and/or possibly out of the [place] area.  She understands all of this, and just asks that she be made aware of options as available.  Will continue to follow/ seek trach SNF placement.  Will provide updates as available.  ? ? ?Expected Discharge Plan: Skilled Nursing Facility ?Barriers to Discharge: Continued Medical Work up ? ?Expected Discharge Plan and Services ?Expected Discharge Plan: Skilled Nursing Facility ?  ?Discharge Planning Services: CM Consult ?  ?Living arrangements for the past 2 months: Single Family Home ?                ?  ?  ?  ?  ?  ?  ?  ?  ?  ?  ? ? ?Social Determinants of Health (SDOH) Interventions ?  ? ?Readmission Risk Interventions ?   ? View : No data to display.  ?  ?  ?  ? ?Quintella Baton, RN, BSN  ?Trauma/Neuro ICU Case Manager ?[phone] ? ?

## 2021-10-18 LAB — GLUCOSE, CAPILLARY
Glucose-Capillary: 135 mg/dL — ABNORMAL HIGH (ref 70–99)
Glucose-Capillary: 135 mg/dL — ABNORMAL HIGH (ref 70–99)
Glucose-Capillary: 126 mg/dL — ABNORMAL HIGH (ref 70–99)
Glucose-Capillary: 139 mg/dL — ABNORMAL HIGH (ref 70–99)
Glucose-Capillary: 120 mg/dL — ABNORMAL HIGH (ref 70–99)
Glucose-Capillary: 133 mg/dL — ABNORMAL HIGH (ref 70–99)

## 2021-10-18 LAB — CBC
HCT: 31.5 % — ABNORMAL LOW (ref 39.0–52.0)
Hemoglobin: 9.7 g/dL — ABNORMAL LOW (ref 13.0–17.0)
MCH: 30.6 pg (ref 26.0–34.0)
MCHC: 30.8 g/dL (ref 30.0–36.0)
MCV: 99.4 fL (ref 80.0–100.0)
Platelets: 370 10*3/uL (ref 150–400)
RBC: 3.17 MIL/uL — ABNORMAL LOW (ref 4.22–5.81)
RDW: 14.2 % (ref 11.5–15.5)
WBC: 15.1 10*3/uL — ABNORMAL HIGH (ref 4.0–10.5)
nRBC: 0.3 % — ABNORMAL HIGH (ref 0.0–0.2)

## 2021-10-18 LAB — BASIC METABOLIC PANEL
Anion gap: 10 (ref 5–15)
BUN: 20 mg/dL (ref 6–20)
CO2: 25 mmol/L (ref 22–32)
Calcium: 9.4 mg/dL (ref 8.9–10.3)
Chloride: 103 mmol/L (ref 98–111)
Creatinine, Ser: 0.7 mg/dL (ref 0.61–1.24)
GFR, Estimated: >60 mL/min (ref >60)
Glucose, Bld: 121 mg/dL — ABNORMAL HIGH (ref 70–99)
Potassium: 4.6 mmol/L (ref 3.5–5.1)
Sodium: 138 mmol/L (ref 135–145)

## 2021-10-18 MED ORDER — OXYCODONE HCL 5 MG/5ML PO SOLN
5.0000 mg | ORAL | Status: DC | PRN
  Administered 2021-10-18 – 2021-10-25 (×9): 5 mg
  Filled 2021-10-18 (×9): qty 5

## 2021-10-18 MED ORDER — ACETAMINOPHEN 500 MG PO TABS
1000.0000 mg | ORAL_TABLET | Freq: Four times a day (QID) | ORAL | Status: DC | PRN
  Administered 2021-10-18 – 2021-11-24 (×30): 1000 mg
  Filled 2021-10-18 (×33): qty 2
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
## 2021-10-19 ENCOUNTER — Encounter (HOSPITAL_COMMUNITY): Payer: Self-pay

## 2021-10-19 ENCOUNTER — Other Ambulatory Visit: Payer: Self-pay

## 2021-10-19 LAB — BASIC METABOLIC PANEL
Anion gap: 10 (ref 5–15)
BUN: 21 mg/dL — ABNORMAL HIGH (ref 6–20)
CO2: 25 mmol/L (ref 22–32)
Calcium: 9.6 mg/dL (ref 8.9–10.3)
Chloride: 105 mmol/L (ref 98–111)
Creatinine, Ser: 0.65 mg/dL (ref 0.61–1.24)
GFR, Estimated: >60 mL/min (ref >60)
Glucose, Bld: 131 mg/dL — ABNORMAL HIGH (ref 70–99)
Potassium: 4.5 mmol/L (ref 3.5–5.1)
Sodium: 140 mmol/L (ref 135–145)

## 2021-10-19 LAB — CBC
HCT: 33.7 % — ABNORMAL LOW (ref 39.0–52.0)
Hemoglobin: 9.8 g/dL — ABNORMAL LOW (ref 13.0–17.0)
MCH: 30.6 pg (ref 26.0–34.0)
MCHC: 29.1 g/dL — ABNORMAL LOW (ref 30.0–36.0)
MCV: 105.3 fL — ABNORMAL HIGH (ref 80.0–100.0)
Platelets: 432 10*3/uL — ABNORMAL HIGH (ref 150–400)
RBC: 3.2 MIL/uL — ABNORMAL LOW (ref 4.22–5.81)
RDW: 14.1 % (ref 11.5–15.5)
WBC: 13.9 10*3/uL — ABNORMAL HIGH (ref 4.0–10.5)
nRBC: 0.2 % (ref 0.0–0.2)

## 2021-10-19 LAB — GLUCOSE, CAPILLARY
Glucose-Capillary: 137 mg/dL — ABNORMAL HIGH (ref 70–99)
Glucose-Capillary: 137 mg/dL — ABNORMAL HIGH (ref 70–99)
Glucose-Capillary: 124 mg/dL — ABNORMAL HIGH (ref 70–99)
Glucose-Capillary: 121 mg/dL — ABNORMAL HIGH (ref 70–99)
Glucose-Capillary: 122 mg/dL — ABNORMAL HIGH (ref 70–99)
Glucose-Capillary: 132 mg/dL — ABNORMAL HIGH (ref 70–99)

## 2021-10-19 NOTE — Progress Notes (Signed)
Bladder scan performed as ordered, and a total of 0 ml scanned. Patient voided a total of 300 ml in the urinal. Will continue monitoring. ?
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
## 2021-10-20 LAB — BASIC METABOLIC PANEL
Anion gap: 7 (ref 5–15)
BUN: 22 mg/dL — ABNORMAL HIGH (ref 6–20)
CO2: 28 mmol/L (ref 22–32)
Calcium: 9.6 mg/dL (ref 8.9–10.3)
Chloride: 104 mmol/L (ref 98–111)
Creatinine, Ser: 0.66 mg/dL (ref 0.61–1.24)
GFR, Estimated: >60 mL/min (ref >60)
Glucose, Bld: 158 mg/dL — ABNORMAL HIGH (ref 70–99)
Potassium: 3.9 mmol/L (ref 3.5–5.1)
Sodium: 139 mmol/L (ref 135–145)

## 2021-10-20 LAB — GLUCOSE, CAPILLARY
Glucose-Capillary: 113 mg/dL — ABNORMAL HIGH (ref 70–99)
Glucose-Capillary: 125 mg/dL — ABNORMAL HIGH (ref 70–99)
Glucose-Capillary: 139 mg/dL — ABNORMAL HIGH (ref 70–99)
Glucose-Capillary: 145 mg/dL — ABNORMAL HIGH (ref 70–99)
Glucose-Capillary: 148 mg/dL — ABNORMAL HIGH (ref 70–99)
Glucose-Capillary: 153 mg/dL — ABNORMAL HIGH (ref 70–99)
Glucose-Capillary: 154 mg/dL — ABNORMAL HIGH (ref 70–99)

## 2021-10-20 LAB — CBC
HCT: 30.3 % — ABNORMAL LOW (ref 39.0–52.0)
Hemoglobin: 9.1 g/dL — ABNORMAL LOW (ref 13.0–17.0)
MCH: 30.1 pg (ref 26.0–34.0)
MCHC: 30 g/dL (ref 30.0–36.0)
MCV: 100.3 fL — ABNORMAL HIGH (ref 80.0–100.0)
Platelets: 395 10*3/uL (ref 150–400)
RBC: 3.02 MIL/uL — ABNORMAL LOW (ref 4.22–5.81)
RDW: 14.1 % (ref 11.5–15.5)
WBC: 12.6 10*3/uL — ABNORMAL HIGH (ref 4.0–10.5)
nRBC: 0.2 % (ref 0.0–0.2)
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
## 2021-10-20 NOTE — Progress Notes (Signed)
No new issues or problems.  Cranial defect soft.  No evidence of awakening.  Remains minimally responsive.  Continue supportive care. ?

## 2021-10-21 LAB — GLUCOSE, CAPILLARY
Glucose-Capillary: 111 mg/dL — ABNORMAL HIGH (ref 70–99)
Glucose-Capillary: 124 mg/dL — ABNORMAL HIGH (ref 70–99)
Glucose-Capillary: 129 mg/dL — ABNORMAL HIGH (ref 70–99)
Glucose-Capillary: 138 mg/dL — ABNORMAL HIGH (ref 70–99)
Glucose-Capillary: 142 mg/dL — ABNORMAL HIGH (ref 70–99)
Glucose-Capillary: 149 mg/dL — ABNORMAL HIGH (ref 70–99)
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
## 2021-10-21 NOTE — Progress Notes (Signed)
RT NOTE: RT x2 removed trach sutures x4 per MD order. No complications. RT will continue to monitor.  ?

## 2021-10-22 LAB — GLUCOSE, CAPILLARY
Glucose-Capillary: 108 mg/dL — ABNORMAL HIGH (ref 70–99)
Glucose-Capillary: 118 mg/dL — ABNORMAL HIGH (ref 70–99)
Glucose-Capillary: 121 mg/dL — ABNORMAL HIGH (ref 70–99)
Glucose-Capillary: 135 mg/dL — ABNORMAL HIGH (ref 70–99)
Glucose-Capillary: 138 mg/dL — ABNORMAL HIGH (ref 70–99)
Glucose-Capillary: 141 mg/dL — ABNORMAL HIGH (ref 70–99)

## 2021-10-22 NOTE — TOC Progression Note (Signed)
Transition of Care (TOC) - Progression Note  ? ? ?Patient Details  ?Name: Sean Bruce ?MRN: [id number] ?Date of Birth: 08-Feb-1996 ? ?Transition of Care (TOC) CM/SW Contact  ?Glennon Mac, RN ?Phone Number: ?10/22/2021, 3:56 PM ? ?Clinical Narrative:    ?Incapacity letter signed by MD and forwarded to Myrtice Lauth with [facility].   ? ? ?Expected Discharge Plan: Skilled Nursing Facility ?Barriers to Discharge: Continued Medical Work up ? ?Expected Discharge Plan and Services ?Expected Discharge Plan: Skilled Nursing Facility ?  ?Discharge Planning Services: CM Consult ?  ?Living arrangements for the past 2 months: Single Family Home ?                ?  ?  ?  ?  ?  ?  ?  ?  ?  ?  ? ? ?Social Determinants of Health (SDOH) Interventions ?  ? ?Readmission Risk Interventions ?   ? View : No data to display.  ?  ?  ?  ? ?Quintella Baton, RN, BSN  ?Trauma/Neuro ICU Case Manager ?[phone] ? ?

## 2021-10-22 NOTE — Progress Notes (Signed)
? ? ? ?  I again went to the bedside of Sean Bruce in an effort to address any family questions that are lingering. Chart reviewed and updates received from RN.  The patient remained stable today.  Spoke with the patient's mother yesterday who agrees she wants continued full scope of care and attempts to place to the long-term care facility for trach.  Awaiting placement and approval for Medicaid insurance for the patient. ? ?I spoke with Sean Best, PA with general surgery.  After discussion about likely miscommunication (see yesterday's palliative care note) it seems we will continue full scope of care.  We will sign off at this point. ? ?Thank you for allowing [place] to participate in the care of Sean Bruce ? ? ?Wynne Dust, AGNP-C ?Palliative Medicine Team  ?Phone: [phone] ?Pager: [phone] ?Amion: Roney Marion ? ?NO CHARGE  ? ?
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
## 2021-10-23 LAB — GLUCOSE, CAPILLARY
Glucose-Capillary: 124 mg/dL — ABNORMAL HIGH (ref 70–99)
Glucose-Capillary: 154 mg/dL — ABNORMAL HIGH (ref 70–99)
Glucose-Capillary: 134 mg/dL — ABNORMAL HIGH (ref 70–99)
Glucose-Capillary: 120 mg/dL — ABNORMAL HIGH (ref 70–99)
Glucose-Capillary: 130 mg/dL — ABNORMAL HIGH (ref 70–99)

## 2021-10-23 LAB — URINALYSIS, ROUTINE W REFLEX MICROSCOPIC
Bilirubin Urine: NEGATIVE
Glucose, UA: NEGATIVE mg/dL
Hgb urine dipstick: NEGATIVE
Ketones, ur: NEGATIVE mg/dL
Leukocytes,Ua: NEGATIVE
Nitrite: NEGATIVE
Protein, ur: NEGATIVE mg/dL
Specific Gravity, Urine: 1.032 — ABNORMAL HIGH (ref 1.005–1.030)
pH: 5 (ref 5.0–8.0)

## 2021-10-23 LAB — CBC
HCT: 36.4 % — ABNORMAL LOW (ref 39.0–52.0)
Hemoglobin: 10.8 g/dL — ABNORMAL LOW (ref 13.0–17.0)
MCH: 29.6 pg (ref 26.0–34.0)
MCHC: 29.7 g/dL — ABNORMAL LOW (ref 30.0–36.0)
MCV: 99.7 fL (ref 80.0–100.0)
Platelets: 334 10*3/uL (ref 150–400)
RBC: 3.65 MIL/uL — ABNORMAL LOW (ref 4.22–5.81)
RDW: 14.1 % (ref 11.5–15.5)
WBC: 13.6 10*3/uL — ABNORMAL HIGH (ref 4.0–10.5)
nRBC: 0 % (ref 0.0–0.2)
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
## 2021-10-23 NOTE — Progress Notes (Addendum)
Spoke with PA-C about downsizing trach. Informed PA-C that per RT policy RT can't change trach until 14 days after initial placement. PA-C said Dr Bobbye Morton will be by to change trach sometime today. ?

## 2021-10-23 NOTE — Progress Notes (Signed)
Trauma Event Note ? ? ? ?TRN at bedside to round. VSS, no needs at this time.  ? ?Last imported Vital Signs ?BP 140/79   Pulse (!) 109   Temp (!) 100.6 ?F (38.1 ?C) (Oral)   Resp 20   Ht 6' (1.829 m)   Wt 198 lb 6.6 oz (90 kg)   SpO2 97%   BMI 26.91 kg/m?  ? ?Trending CBC ?Recent Labs  ?  10/20/21 ?3943  ?WBC 12.6*  ?HGB 9.1*  ?HCT 30.3*  ?PLT 395  ? ? ?Trending Coag's ?No results for input(s): APTT, INR in the last 72 hours. ? ?Trending BMET ?Recent Labs  ?  10/20/21 ?2003  ?NA 139  ?K 3.9  ?CL 104  ?CO2 28  ?BUN 22*  ?CREATININE 0.66  ?GLUCOSE 158*  ? ? ? ? ?Timi Reeser O Abshir Paolini  ?Trauma Response RN ? ?Please call TRN at [phone] for further assistance. ? ? ?  ?
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
## 2021-10-24 ENCOUNTER — Inpatient Hospital Stay (HOSPITAL_COMMUNITY): Payer: Medicaid Other

## 2021-10-24 DIAGNOSIS — T1490XA Injury, unspecified, initial encounter: Secondary | ICD-10-CM

## 2021-10-24 LAB — GLUCOSE, CAPILLARY
Glucose-Capillary: 126 mg/dL — ABNORMAL HIGH (ref 70–99)
Glucose-Capillary: 145 mg/dL — ABNORMAL HIGH (ref 70–99)
Glucose-Capillary: 151 mg/dL — ABNORMAL HIGH (ref 70–99)
Glucose-Capillary: 154 mg/dL — ABNORMAL HIGH (ref 70–99)
Glucose-Capillary: 156 mg/dL — ABNORMAL HIGH (ref 70–99)
Glucose-Capillary: 156 mg/dL — ABNORMAL HIGH (ref 70–99)
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
## 2021-10-24 MED ORDER — SODIUM CHLORIDE 0.9 % IV SOLN
2.0000 g | Freq: Three times a day (TID) | INTRAVENOUS | Status: DC
  Administered 2021-10-24 – 2021-10-31 (×20): 2 g via INTRAVENOUS
  Filled 2021-10-24 (×21): qty 12.5

## 2021-10-24 MED ORDER — SILVER NITRATE-POT NITRATE 75-25 % EX MISC
1.0000 | Freq: Once | CUTANEOUS | Status: DC
  Filled 2021-10-24: qty 1

## 2021-10-24 NOTE — Progress Notes (Signed)
VASCULAR LAB ? ? ? ?Bilateral lower extremity venous duplex has been performed. ? ?See CV proc for preliminary results. ? ? ?Derisha Funderburke, RVT ?10/24/2021, 12:49 PM ? ?

## 2021-10-24 NOTE — Progress Notes (Signed)
?   10/23/21 2241  ?Assess: MEWS Score  ?Temp 100.2 ?F (37.9 ?C)  ?BP 121/73  ?Pulse Rate (!) 109  ?Resp 19  ?SpO2 99 %  ?O2 Device Tracheostomy Collar  ?Patient Activity (if Appropriate) In bed  ?O2 Flow Rate (L/min) 5 L/min  ?FiO2 (%) 28 %  ?Assess: MEWS Score  ?MEWS Temp 0  ?MEWS Systolic 0  ?MEWS Pulse 1  ?MEWS RR 0  ?MEWS LOC 2  ?MEWS Score 3  ?MEWS Score Color Yellow  ?Assess: if the MEWS score is Yellow or Red  ?Were vital signs taken at a resting state? Yes  ?Focused Assessment No change from prior assessment  ?Early Detection of Sepsis Score *See Row Information* Low  ?MEWS guidelines implemented *See Row Information* No, previously yellow, continue vital signs every 4 hours  ?Treat  ?MEWS Interventions Administered prn meds/treatments  ?Pain Scale Faces  ?Pain Score Asleep  ?Pain Intervention(s) RN made aware;Environmental changes  ?Notify: Charge Nurse/RN  ?Name of Charge Nurse/RN Notified Enon  ?Date Charge Nurse/RN Notified 10/23/21  ?Time Charge Nurse/RN Notified 2246  ?Document  ?Patient Outcome Other (Comment) ?(Pt, continuing yellow mews upon transfer from previous unit, continue monitoring, no changes in condition)  ? ? ?
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
## 2021-10-24 NOTE — Progress Notes (Signed)
?   10/24/21 0353  ?Assess: MEWS Score  ?Temp 99.3 ?F (37.4 ?C)  ?BP 121/79  ?Pulse Rate (!) 110  ?Resp (!) 21  ?Level of Consciousness Responds to Pain  ?SpO2 98 %  ?O2 Device Tracheostomy Collar  ?O2 Flow Rate (L/min) 5 L/min  ?FiO2 (%) 28 %  ?Assess: MEWS Score  ?MEWS Temp 0  ?MEWS Systolic 0  ?MEWS Pulse 1  ?MEWS RR 1  ?MEWS LOC 2  ?MEWS Score 4  ?MEWS Score Color Red  ?Assess: if the MEWS score is Yellow or Red  ?Were vital signs taken at a resting state? Yes  ?Focused Assessment No change from prior assessment  ?Early Detection of Sepsis Score *See Row Information* Low  ?MEWS guidelines implemented *See Row Information* Yes  ?Treat  ?MEWS Interventions Administered prn meds/treatments  ?Pain Scale Faces  ?Faces Pain Scale 2  ?Pain Type  ?(uta)  ?Take Vital Signs  ?Increase Vital Sign Frequency  Red: Q 1hr X 4 then Q 4hr X 4, if remains red, continue Q 4hrs  ?Escalate  ?MEWS: Escalate Red: discuss with charge nurse/RN and provider, consider discussing with RRT  ?Notify: Charge Nurse/RN  ?Name of Charge Nurse/RN Notified Josephine,RN  ?Date Charge Nurse/RN Notified 10/24/21  ?Time Charge Nurse/RN Notified 0400  ?Notify: Provider  ?Provider Name/Title Norva Riffle MD  ?Date Provider Notified 10/24/21  ?Time Provider Notified [phone]  ?Notification Type Page  ?Notification Reason Other (Comment) ?(MEWS now increased to RED based on RR)  ?Provider response Other (Comment)  ?Date of Provider Response  ?(awaiting response)  ?Document  ?Patient Outcome Other (Comment) ?(Continue to monitor, awaiting further intervention if needed, pt stable)  ? ? ?

## 2021-10-24 NOTE — Progress Notes (Signed)
Subjective: ?Patient reports  comatose ? ?Objective: ?Vital signs in last 24 hours: ?Temp:  [99.3 ?F (37.4 ?C)-100.9 ?F (38.3 ?C)] 99.8 ?F (37.7 ?C) (04/27 9242) ?Pulse Rate:  [95-112] 112 (04/27 0655) ?Resp:  [14-28] 20 (04/27 6834) ?BP: (121-137)/(73-88) 133/88 (04/27 1962) ?SpO2:  [94 %-100 %] 96 % (04/27 0655) ?FiO2 (%):  [28 %] 28 % (04/27 0655) ?Weight:  [83.7 kg] 83.7 kg (04/27 0439) ? ?Intake/Output from previous day: ?04/26 0701 - 04/27 0700 ?In: -  ?Out: 1475 [Urine:1475] ?Intake/Output this shift: ?No intake/output data recorded. ? ?Flaps soft downgoing gaze minimal flexion withdrawal both upper extremities right greater than left ? ?Lab Results: ?Recent Labs  ?  10/23/21 ?0908  ?WBC 13.6*  ?HGB 10.8*  ?HCT 36.4*  ?PLT 334  ? ?BMET ?No results for input(s): NA, K, CL, CO2, GLUCOSE, BUN, CREATININE, CALCIUM in the last 72 hours. ? ?Studies/Results: ?No results found. ? ?Assessment/Plan: ?No change neurologically.  Swelling is going down flap is soft.  Check routine follow-up CT scan in the morning ? LOS: 29 days  ? ? ? ?Mariam Dollar ?10/24/2021, 8:26 AM ? ? ? ? ?
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
## 2021-10-24 NOTE — Progress Notes (Signed)
?   10/24/21 1609  ?Assess: MEWS Score  ?Temp (!) 102.2 ?F (39 ?C)  ?BP (!) 141/97  ?Pulse Rate (!) 124  ?Resp (!) 26  ?Level of Consciousness Responds to Pain  ?SpO2 98 %  ?O2 Device Tracheostomy Collar  ?O2 Flow Rate (L/min) 5 L/min  ?FiO2 (%) 28 %  ?Assess: MEWS Score  ?MEWS Temp 2  ?MEWS Systolic 0  ?MEWS Pulse 2  ?MEWS RR 2  ?MEWS LOC 2  ?MEWS Score 8  ?MEWS Score Color Red  ?Assess: if the MEWS score is Yellow or Red  ?Were vital signs taken at a resting state? Yes  ?Focused Assessment No change from prior assessment  ?Early Detection of Sepsis Score *See Row Information* Low  ?MEWS guidelines implemented *See Row Information* No, previously red, continue vital signs every 4 hours  ? ? ?

## 2021-10-24 NOTE — Progress Notes (Signed)
Sputum sample obtained and sent down to main lab without complications.  
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
## 2021-10-24 NOTE — Progress Notes (Signed)
?   10/24/21 0850  ?Assess: MEWS Score  ?Pulse Rate (!) 133  ?Resp (!) 22  ?Level of Consciousness Responds to Pain  ?SpO2 95 %  ?O2 Device Tracheostomy Collar  ?O2 Flow Rate (L/min) 5 L/min  ?FiO2 (%) 28 %  ?Assess: MEWS Score  ?MEWS Temp 0  ?MEWS Systolic 0  ?MEWS Pulse 3  ?MEWS RR 1  ?MEWS LOC 2  ?MEWS Score 6  ?MEWS Score Color Red  ?Assess: if the MEWS score is Yellow or Red  ?Were vital signs taken at a resting state? Yes  ?Focused Assessment No change from prior assessment  ?Early Detection of Sepsis Score *See Row Information* Low  ?MEWS guidelines implemented *See Row Information* No, previously red, continue vital signs every 4 hours  ?Treat  ?MEWS Interventions Administered scheduled meds/treatments;Administered prn meds/treatments  ?Pain Scale Faces  ?Pain Score Asleep  ?Escalate  ?MEWS: Escalate Red: discuss with charge nurse/RN and provider, consider discussing with RRT  ?Notify: Charge Nurse/RN  ?Name of Charge Nurse/RN Notified Debroah Loop RN  ?Date Charge Nurse/RN Notified 10/24/21  ?Time Charge Nurse/RN Notified [phone]  ?Notify: Provider  ?Provider Name/Title Unknown Jim PA  ?Date Provider Notified 10/24/21  ?Time Provider Notified [phone]  ?Notification Type Face-to-face  ?Notification Reason Other (Comment) ?(Red MEWS)  ?Provider response At bedside  ? ? ?
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
## 2021-10-24 NOTE — Plan of Care (Signed)
  Problem: Education: Goal: Knowledge of General Education information will improve Description Including pain rating scale, medication(s)/side effects and non-pharmacologic comfort measures Outcome: Progressing   Problem: Health Behavior/Discharge Planning: Goal: Ability to manage health-related needs will improve Outcome: Progressing   

## 2021-10-24 NOTE — Progress Notes (Signed)
Notified PA on call for Amion that patient was bleeding from around PEG tube. Got a call to apply pressure. Pressure applied. ?
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
## 2021-10-25 LAB — GLUCOSE, CAPILLARY
Glucose-Capillary: 151 mg/dL — ABNORMAL HIGH (ref 70–99)
Glucose-Capillary: 137 mg/dL — ABNORMAL HIGH (ref 70–99)
Glucose-Capillary: 128 mg/dL — ABNORMAL HIGH (ref 70–99)
Glucose-Capillary: 133 mg/dL — ABNORMAL HIGH (ref 70–99)
Glucose-Capillary: 120 mg/dL — ABNORMAL HIGH (ref 70–99)

## 2021-10-25 LAB — BASIC METABOLIC PANEL
Anion gap: 9 (ref 5–15)
BUN: 27 mg/dL — ABNORMAL HIGH (ref 6–20)
CO2: 23 mmol/L (ref 22–32)
Calcium: 9.5 mg/dL (ref 8.9–10.3)
Chloride: 111 mmol/L (ref 98–111)
Creatinine, Ser: 0.72 mg/dL (ref 0.61–1.24)
GFR, Estimated: >60 mL/min (ref >60)
Glucose, Bld: 139 mg/dL — ABNORMAL HIGH (ref 70–99)
Potassium: 4.1 mmol/L (ref 3.5–5.1)
Sodium: 143 mmol/L (ref 135–145)

## 2021-10-25 LAB — CBC
HCT: 37.7 % — ABNORMAL LOW (ref 39.0–52.0)
Hemoglobin: 11.1 g/dL — ABNORMAL LOW (ref 13.0–17.0)
MCH: 29.5 pg (ref 26.0–34.0)
MCHC: 29.4 g/dL — ABNORMAL LOW (ref 30.0–36.0)
MCV: 100.3 fL — ABNORMAL HIGH (ref 80.0–100.0)
Platelets: 283 10*3/uL (ref 150–400)
RBC: 3.76 MIL/uL — ABNORMAL LOW (ref 4.22–5.81)
RDW: 14.5 % (ref 11.5–15.5)
WBC: 15.9 10*3/uL — ABNORMAL HIGH (ref 4.0–10.5)
nRBC: 0 % (ref 0.0–0.2)

## 2021-10-25 MED ORDER — INSULIN GLARGINE-YFGN 100 UNIT/ML ~~LOC~~ SOLN
15.0000 [IU] | Freq: Every day | SUBCUTANEOUS | Status: DC
  Administered 2021-10-25 – 2021-11-25 (×32): 15 [IU] via SUBCUTANEOUS
  Filled 2021-10-25 (×33): qty 0.15

## 2021-10-25 NOTE — Progress Notes (Signed)
Patient seen today by trach team for consult.  No education is needed at this time.  All necessary equipment is at beside.   Will continue to follow for progression.  
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
## 2021-10-26 ENCOUNTER — Inpatient Hospital Stay (HOSPITAL_COMMUNITY): Payer: Medicaid Other

## 2021-10-26 LAB — GLUCOSE, CAPILLARY
Glucose-Capillary: 102 mg/dL — ABNORMAL HIGH (ref 70–99)
Glucose-Capillary: 116 mg/dL — ABNORMAL HIGH (ref 70–99)
Glucose-Capillary: 129 mg/dL — ABNORMAL HIGH (ref 70–99)
Glucose-Capillary: 138 mg/dL — ABNORMAL HIGH (ref 70–99)
Glucose-Capillary: 141 mg/dL — ABNORMAL HIGH (ref 70–99)
Glucose-Capillary: 143 mg/dL — ABNORMAL HIGH (ref 70–99)
Glucose-Capillary: 98 mg/dL (ref 70–99)

## 2021-10-26 LAB — CULTURE, RESPIRATORY W GRAM STAIN

## 2021-10-26 LAB — CBC
HCT: 35.3 % — ABNORMAL LOW (ref 39.0–52.0)
Hemoglobin: 10.8 g/dL — ABNORMAL LOW (ref 13.0–17.0)
MCH: 30.5 pg (ref 26.0–34.0)
MCHC: 30.6 g/dL (ref 30.0–36.0)
MCV: 99.7 fL (ref 80.0–100.0)
Platelets: 250 10*3/uL (ref 150–400)
RBC: 3.54 MIL/uL — ABNORMAL LOW (ref 4.22–5.81)
RDW: 14.6 % (ref 11.5–15.5)
WBC: 15.7 10*3/uL — ABNORMAL HIGH (ref 4.0–10.5)
nRBC: 0 % (ref 0.0–0.2)

## 2021-10-26 IMAGING — CT CT HEAD W/O CM
4 series · 15 of 47 positions shown, 17 images · non-contrast
Comparison: [DATE]

CLINICAL DATA: Intracranial hemorrhage, follow-up



[Series 2: head wo · axial · 0.40mm/px · z∈[-124,+14]mm · 7 of 39 slices shown, 9 images]
[im 5/39  brain]
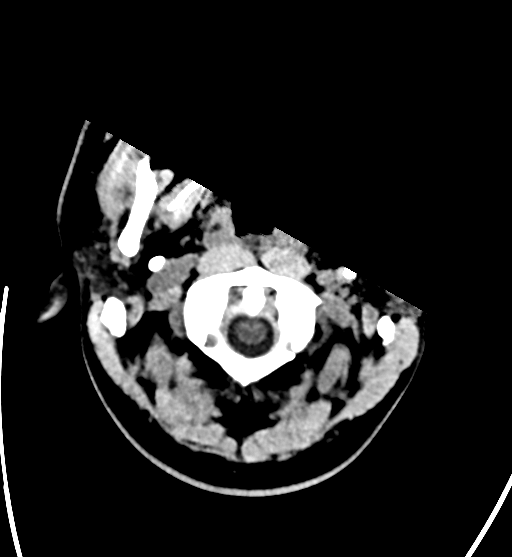
[im 5/39  bone]
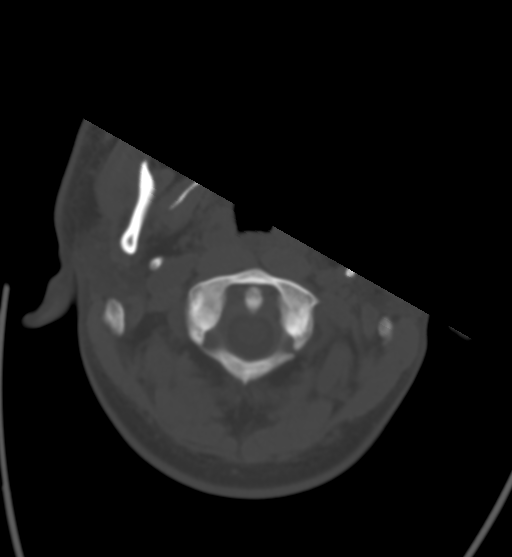
[im 10/39  brain]
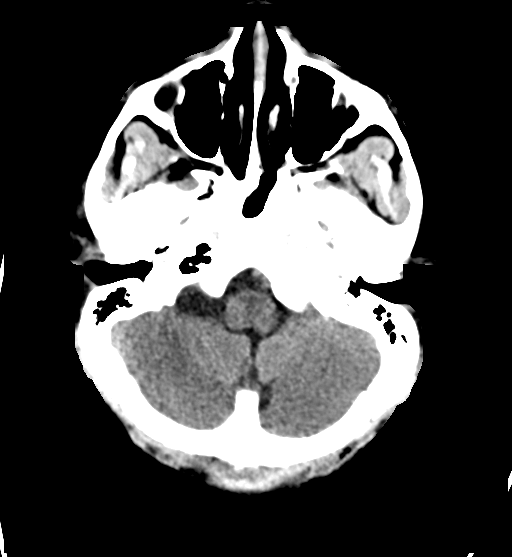
[im 15/39  brain]
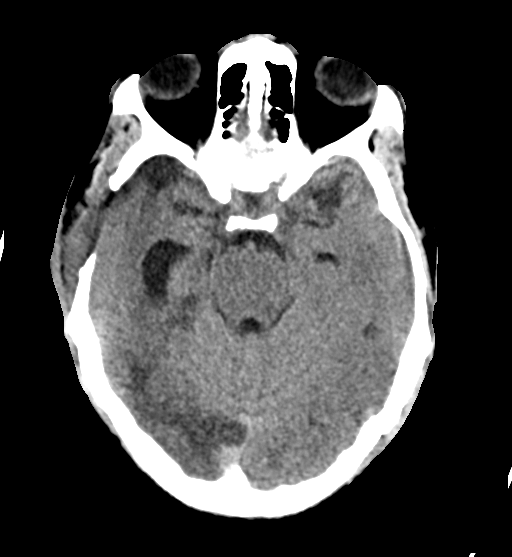
[im 20/39  brain]
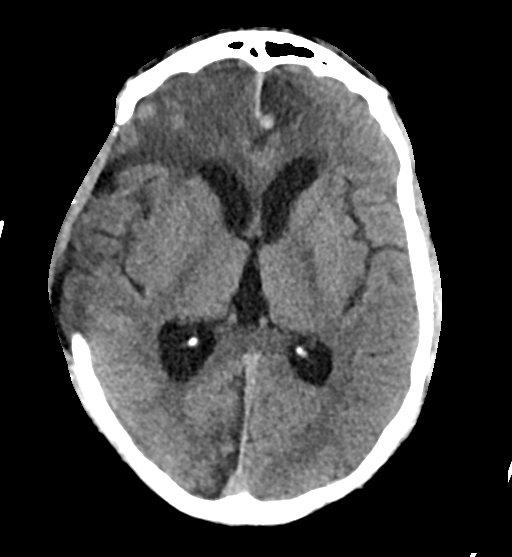
[im 24/39  brain]
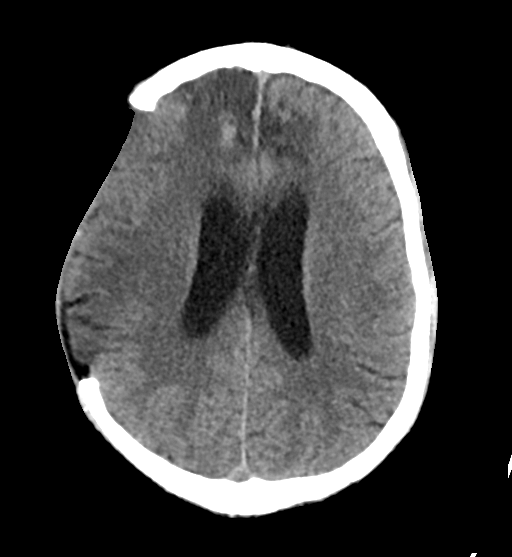
[im 24/39  bone]
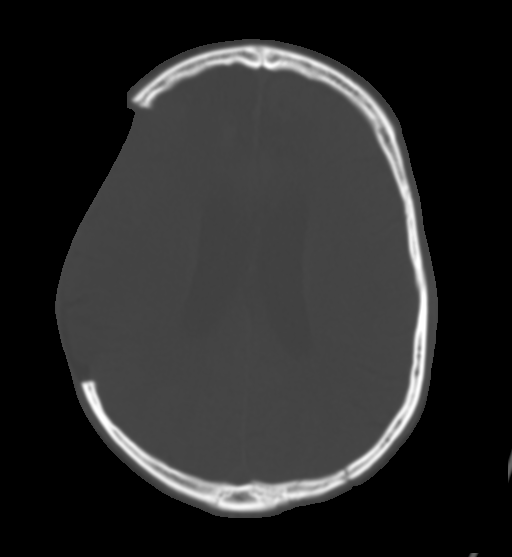
[im 29/39  brain]
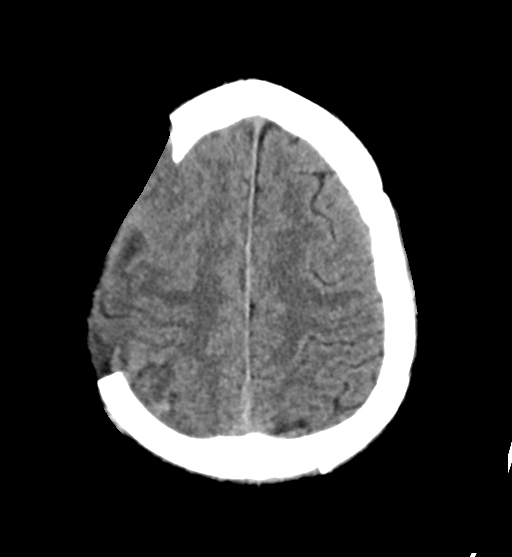
[im 34/39  brain]
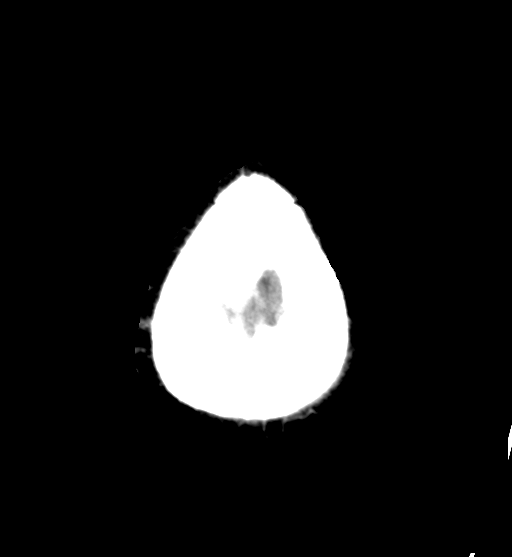

[Series 3: head bone · axial · 0.45mm/px · z∈[-98,-80]mm · 2 of 90 slices shown]
[im 9/90  bone]
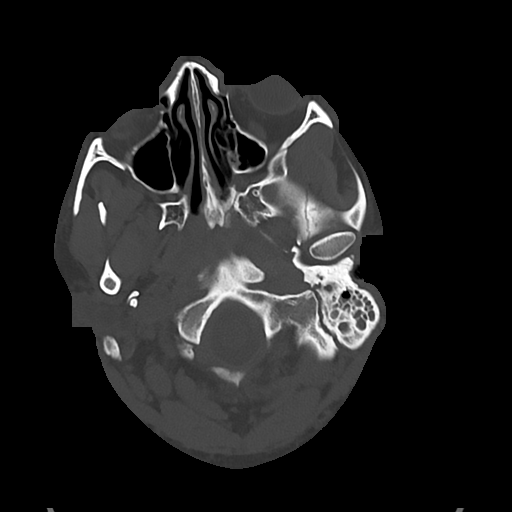
[im 18/90  bone]
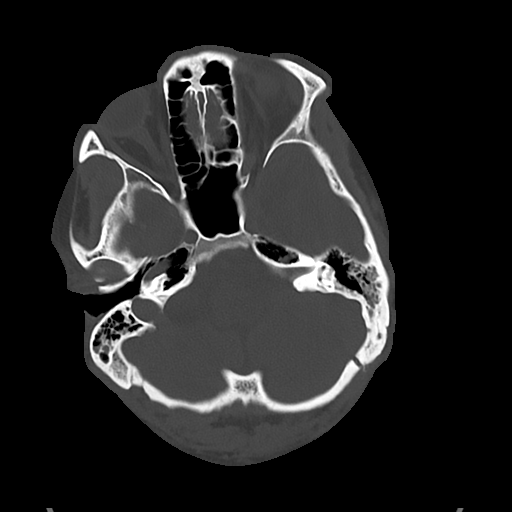

[Series 4: cor soft · coronal · 0.37mm/px · 3 of 76 slices shown]
[im 26/76  brain]
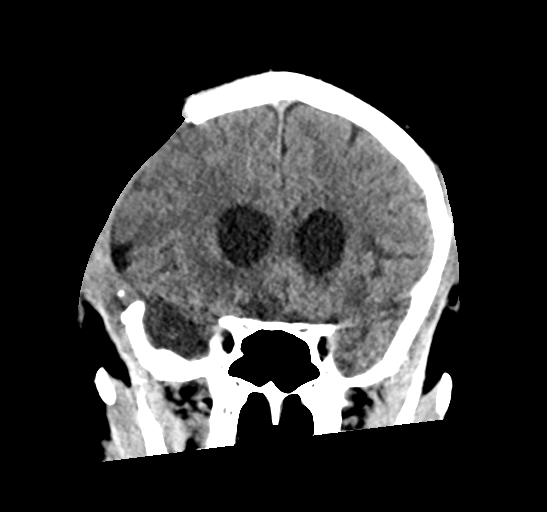
[im 34/76  brain]
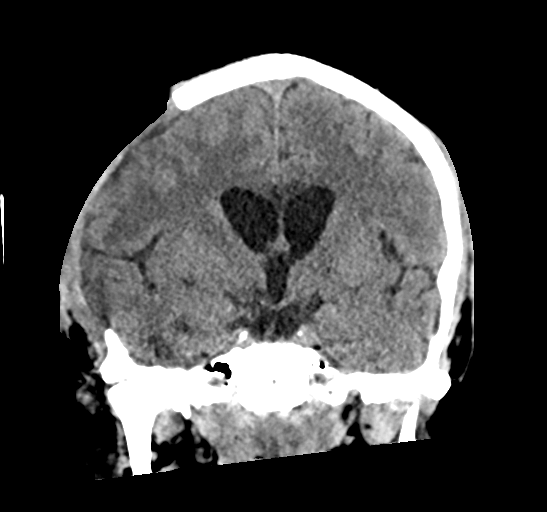
[im 42/76  brain]
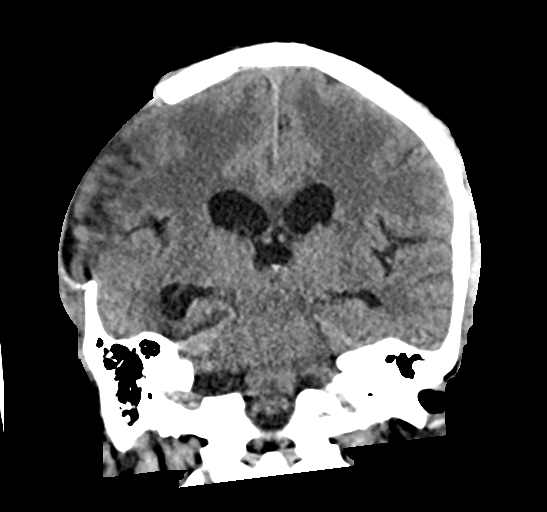

[Series 5: sag soft · sagittal · 0.37mm/px · 3 of 69 slices shown]
[im 23/69  brain]
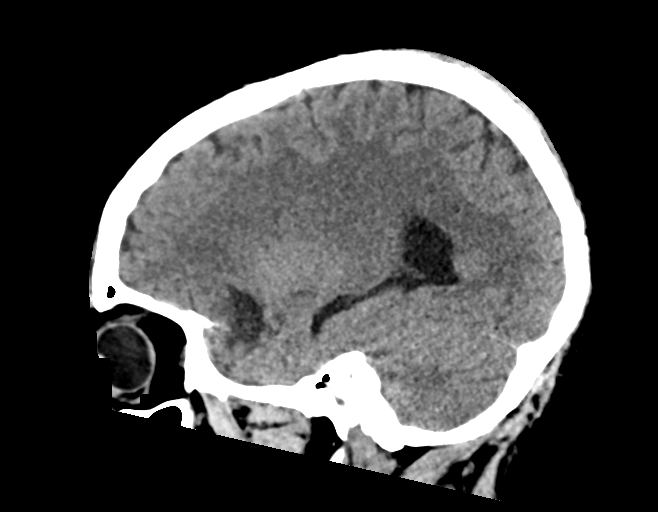
[im 35/69  brain]
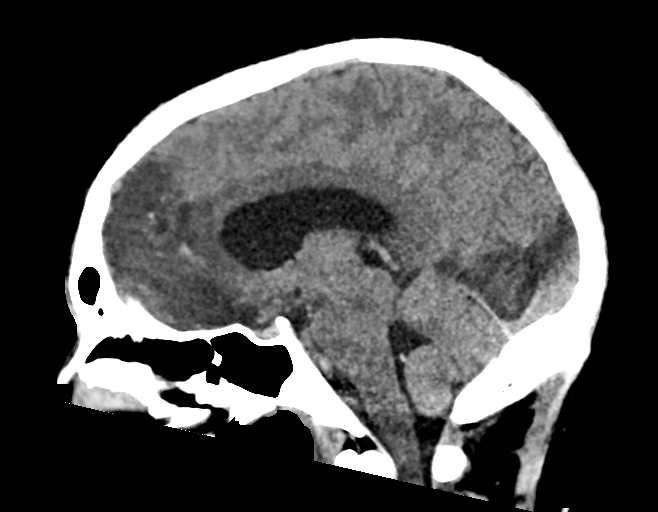
[im 46/69  brain]
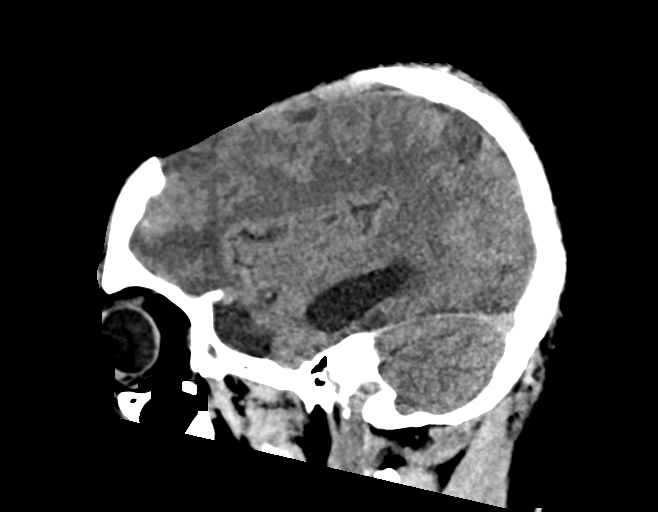

[15 of 47 positions shown; findings below may reference images not displayed]

FINDINGS: Brain: Evolution of previously seen hemorrhage and edema. Remains
small foci of hyperdense parenchymal hemorrhage in the anterior
frontal lobes. Subdural hemorrhage along the falx and right cerebral
convexity has essentially resolved. Decreased edema in the temporal
lobes and in association with right occipital infarct. There is a
new small infarct of the left gangliocapsular region. Suspect new
small area of infarction in the right parietal lobe (series 4, image
55).

Decreased herniation of parenchyma through the craniectomy defect.
No significant residual mass effect. Increased caliber of ventricles
reflects is improvement, but a component of hydrocephalus is not
excluded.

Vascular: No new finding.

Skull: Wide right decompressive craniectomy. Calvarial fractures as
previously described.

Sinuses/Orbits: No acute finding.

Other: None.
IMPRESSION: Evolution of areas of hemorrhage and infarction. No acute
hemorrhage. New or more apparent age-indeterminate small infarcts of
the left gangliocapsular region and right parietal lobe.

Decreased herniation through the craniectomy defect and no
significant residual mass effect. Ventricles have increased in
caliber reflecting improved mass effect, but a component of
hydrocephalus is not excluded.

## 2021-10-26 MED ORDER — IBUPROFEN 100 MG/5ML PO SUSP
600.0000 mg | Freq: Once | ORAL | Status: AC | PRN
  Administered 2021-10-26: 600 mg
  Filled 2021-10-26: qty 30

## 2021-10-26 MED ORDER — VANCOMYCIN HCL 2000 MG/400ML IV SOLN
2000.0000 mg | Freq: Once | INTRAVENOUS | Status: AC
  Administered 2021-10-26: 2000 mg via INTRAVENOUS
  Filled 2021-10-26 (×2): qty 400

## 2021-10-26 MED ORDER — VANCOMYCIN HCL 1250 MG/250ML IV SOLN
1250.0000 mg | Freq: Three times a day (TID) | INTRAVENOUS | Status: DC
  Administered 2021-10-26 – 2021-10-28 (×5): 1250 mg via INTRAVENOUS
  Filled 2021-10-26 (×7): qty 250
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
## 2021-10-26 NOTE — Progress Notes (Signed)
Trauma Event Note ? ? ?Reason for Call : ?Called by [facility] on 6N to assist with transport of patient to CT head. According to NSG notes they are requesting this be completed today. ? ?Also notified by [facility] that patient may need a higher level of care - requiring suctioning every hour, HR 120s, Temperature today about 102. Spoke with Trauma PA Nehemiah Settle who also agreed he may be appropriate to transfer back to 4NP for progressive care bed.  ? ?RT and SWOT RN Juliette Alcide also at bedside, agree that patient should transfer to 4NP. Transfer orders placed by [facility]. SWOT to help facilitate transfer. ? ?Bedside handoff with Luther Parody RN on 6N. ? ?Approx time >1 hr. ? ?Last imported Vital Signs ?BP 126/76 (BP Location: Right Arm)   Pulse (!) 119   Temp (!) 102.4 ?F (39.1 ?C) (Axillary)   Resp 20   Ht 6' (1.829 m)   Wt 84.9 kg   SpO2 100%   BMI 25.38 kg/m?  ? ?Trending CBC ?Recent Labs  ?  10/25/21 ?0124 10/26/21 ?0108  ?WBC 15.9* 15.7*  ?HGB 11.1* 10.8*  ?HCT 37.7* 35.3*  ?PLT 283 250  ? ? ?Trending Coag's ?No results for input(s): APTT, INR in the last 72 hours. ? ?Trending BMET ?Recent Labs  ?  10/25/21 ?0124  ?NA 143  ?K 4.1  ?CL 111  ?CO2 23  ?BUN 27*  ?CREATININE 0.72  ?GLUCOSE 139*  ? ? ?Jill Side Harvey Matlack  ?Trauma Response RN ? ?Please call TRN at [phone] for further assistance. ? ? ?  ?
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
## 2021-10-26 NOTE — Plan of Care (Signed)

## 2021-10-26 NOTE — Progress Notes (Signed)
Patient ID: Sean Bruce, male   DOB: 1995-07-02, 26 y.o.   MRN: [id number] ?Minimal-change pupil sluggish flap is soft minimal movement to noxious stimulation for some reason CT scan not done yesterday asked the nurse to check into it maybe we will get it by tomorrow ?

## 2021-10-26 NOTE — Progress Notes (Signed)
Informed Charge Nurse [place] and paged dr. Andrey Campanile of patients MEWS of 6. Tylenol administered via PEG, ice pack placed on patient. ? ? ? 10/25/21 2000  ?Assess: MEWS Score  ?Temp (!) 102 ?F (38.9 ?C)  ?BP 125/75  ?Pulse Rate (!) 119  ?Resp (!) 22  ?SpO2 99 %  ?O2 Device Tracheostomy Collar  ?O2 Flow Rate (L/min) 5 L/min  ?Assess: MEWS Score  ?MEWS Temp 2  ?MEWS Systolic 0  ?MEWS Pulse 2  ?MEWS RR 1  ?MEWS LOC 2  ?MEWS Score 7  ?MEWS Score Color Red  ?Assess: if the MEWS score is Yellow or Red  ?Were vital signs taken at a resting state? Yes  ?Focused Assessment No change from prior assessment  ?Early Detection of Sepsis Score *See Row Information* Low  ?MEWS guidelines implemented *See Row Information* No, previously red, continue vital signs every 4 hours  ?Treat  ?MEWS Interventions Administered prn meds/treatments  ?Pain Scale Faces  ?Escalate  ?MEWS: Escalate Red: discuss with charge nurse/RN and provider, consider discussing with RRT  ?Notify: Charge Nurse/RN  ?Name of Charge Nurse/RN Notified [place]  ?Date Charge Nurse/RN Notified 10/25/21  ?Time Charge Nurse/RN Notified 2030  ?Notify: Provider  ?Provider Name/Title dr Andrey Campanile  ?Date Provider Notified 10/24/21  ?Time Provider Notified 2029  ?Notification Type Page  ?Notification Reason  ?(MEWS 6)  ? ? ?

## 2021-10-27 ENCOUNTER — Inpatient Hospital Stay (HOSPITAL_COMMUNITY): Payer: Medicaid Other

## 2021-10-27 ENCOUNTER — Encounter (HOSPITAL_COMMUNITY): Payer: Self-pay

## 2021-10-27 LAB — GLUCOSE, CAPILLARY
Glucose-Capillary: 107 mg/dL — ABNORMAL HIGH (ref 70–99)
Glucose-Capillary: 120 mg/dL — ABNORMAL HIGH (ref 70–99)
Glucose-Capillary: 143 mg/dL — ABNORMAL HIGH (ref 70–99)
Glucose-Capillary: 125 mg/dL — ABNORMAL HIGH (ref 70–99)
Glucose-Capillary: 130 mg/dL — ABNORMAL HIGH (ref 70–99)
Glucose-Capillary: 128 mg/dL — ABNORMAL HIGH (ref 70–99)

## 2021-10-27 LAB — CBC
HCT: 38.4 % — ABNORMAL LOW (ref 39.0–52.0)
Hemoglobin: 11.3 g/dL — ABNORMAL LOW (ref 13.0–17.0)
MCH: 30 pg (ref 26.0–34.0)
MCHC: 29.4 g/dL — ABNORMAL LOW (ref 30.0–36.0)
MCV: 101.9 fL — ABNORMAL HIGH (ref 80.0–100.0)
Platelets: 189 10*3/uL (ref 150–400)
RBC: 3.77 MIL/uL — ABNORMAL LOW (ref 4.22–5.81)
RDW: 14.7 % (ref 11.5–15.5)
WBC: 17.4 10*3/uL — ABNORMAL HIGH (ref 4.0–10.5)
nRBC: 0 % (ref 0.0–0.2)

## 2021-10-27 IMAGING — DX DG CHEST 1V PORT
1 series · 1 of 1 positions shown · non-contrast
Comparison: Portable exam [XR] hours compared to [DATE]

CLINICAL DATA: Pneumonia

EXAM:
PORTABLE CHEST 1 VIEW

[chest ap]
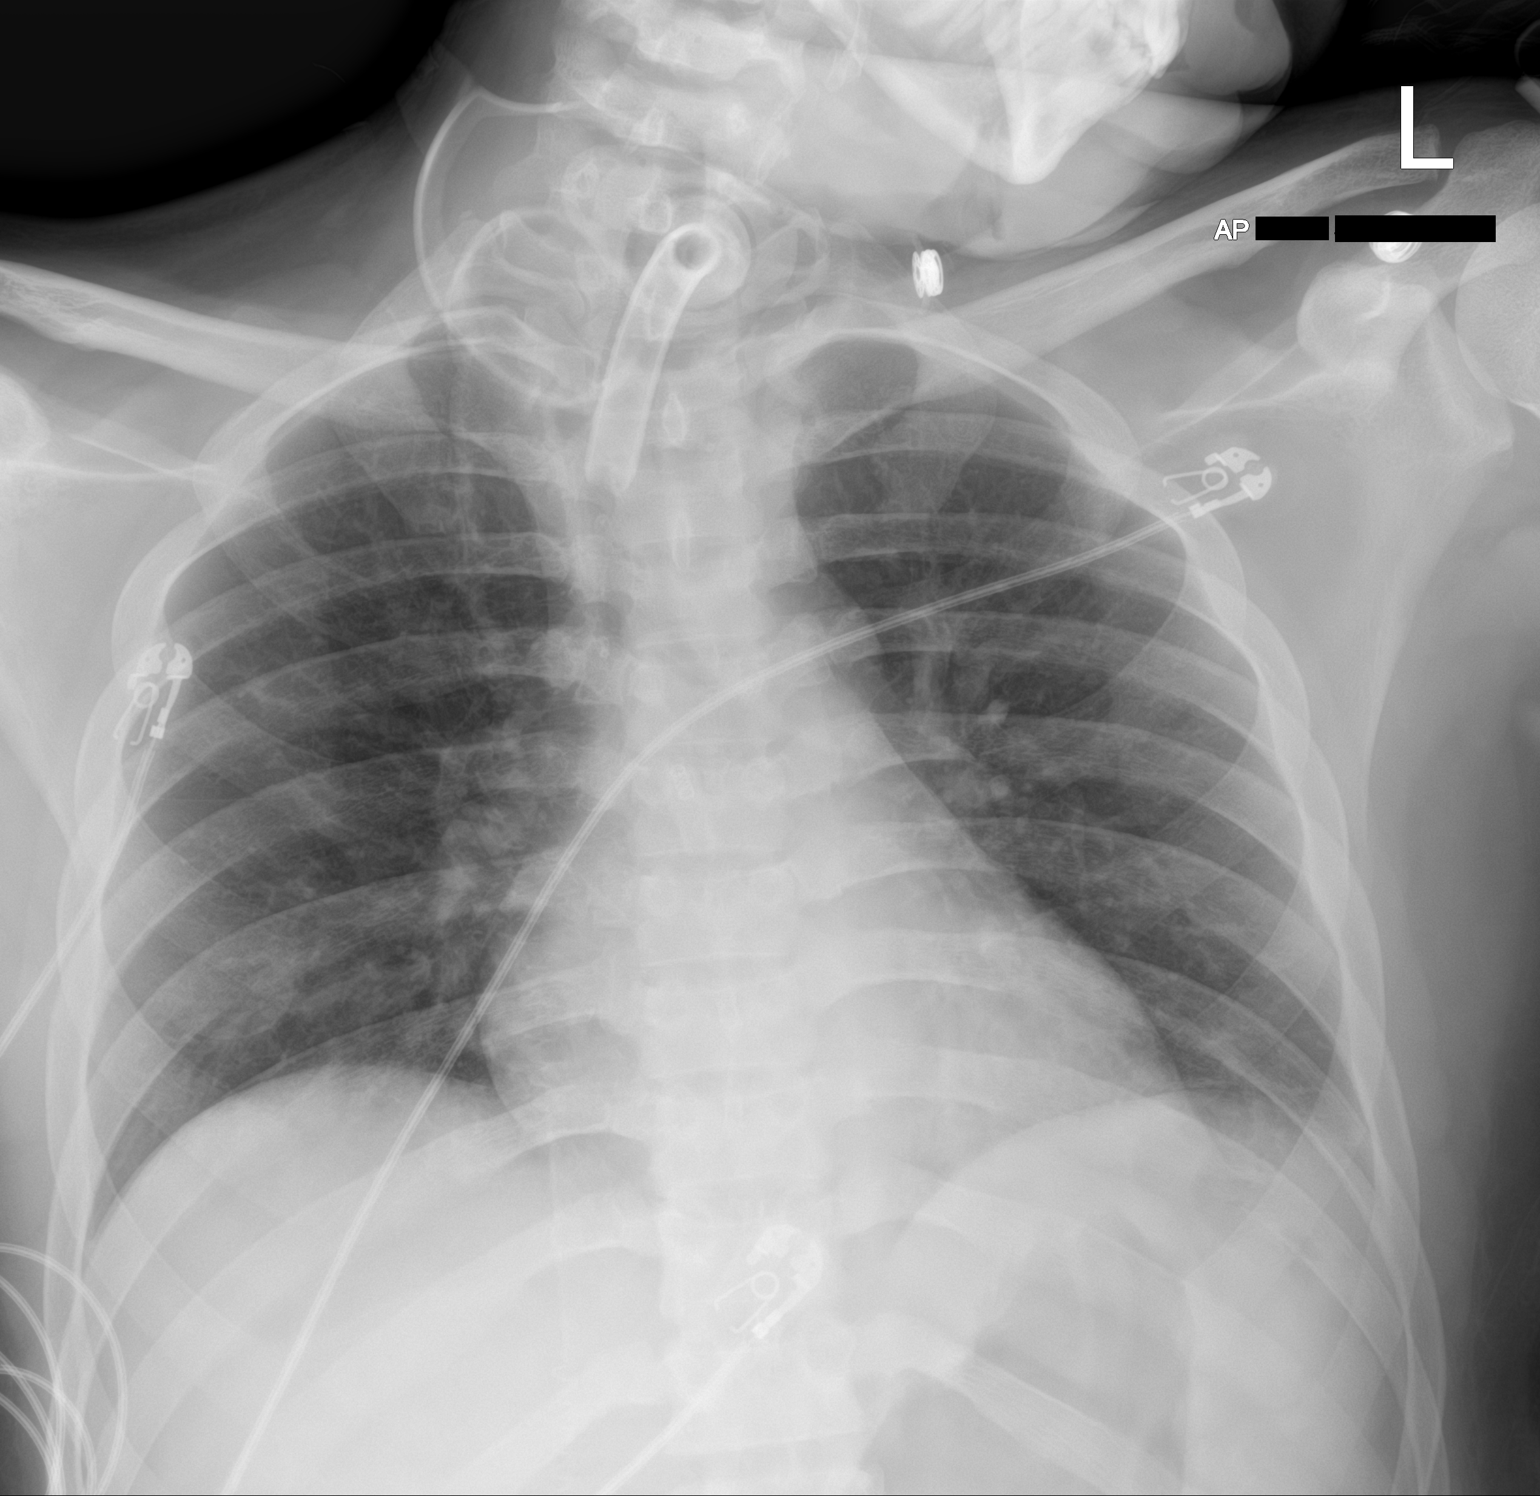

[1 of 1 positions shown; findings below may reference images not displayed]

FINDINGS: Tracheostomy tube projects over tracheal air column.

Normal heart size, mediastinal contours, and pulmonary vascularity.

Improved retrocardiac LEFT lower lobe opacity question resolving
atelectasis versus infiltrate.

Remaining lungs clear.

No pleural effusion or pneumothorax.
IMPRESSION: Improving LEFT lower lobe opacity question improving atelectasis or
infiltrate.
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
## 2021-10-27 NOTE — Progress Notes (Signed)
No change in neurologic function. CT head showed some evolution of hemorrhage and infarction. Continue support care. No new neurosurgical interventions ?

## 2021-10-28 LAB — BASIC METABOLIC PANEL
Anion gap: 6 (ref 5–15)
BUN: 24 mg/dL — ABNORMAL HIGH (ref 6–20)
CO2: 24 mmol/L (ref 22–32)
Calcium: 8.9 mg/dL (ref 8.9–10.3)
Chloride: 115 mmol/L — ABNORMAL HIGH (ref 98–111)
Creatinine, Ser: 0.6 mg/dL — ABNORMAL LOW (ref 0.61–1.24)
GFR, Estimated: >60 mL/min (ref >60)
Glucose, Bld: 129 mg/dL — ABNORMAL HIGH (ref 70–99)
Potassium: 3.4 mmol/L — ABNORMAL LOW (ref 3.5–5.1)
Sodium: 145 mmol/L (ref 135–145)

## 2021-10-28 LAB — CBC
HCT: 30.5 % — ABNORMAL LOW (ref 39.0–52.0)
Hemoglobin: 9.4 g/dL — ABNORMAL LOW (ref 13.0–17.0)
MCH: 31 pg (ref 26.0–34.0)
MCHC: 30.8 g/dL (ref 30.0–36.0)
MCV: 100.7 fL — ABNORMAL HIGH (ref 80.0–100.0)
Platelets: 206 10*3/uL (ref 150–400)
RBC: 3.03 MIL/uL — ABNORMAL LOW (ref 4.22–5.81)
RDW: 14.7 % (ref 11.5–15.5)
WBC: 11.8 10*3/uL — ABNORMAL HIGH (ref 4.0–10.5)
nRBC: 0 % (ref 0.0–0.2)

## 2021-10-28 LAB — GLUCOSE, CAPILLARY
Glucose-Capillary: 127 mg/dL — ABNORMAL HIGH (ref 70–99)
Glucose-Capillary: 125 mg/dL — ABNORMAL HIGH (ref 70–99)
Glucose-Capillary: 115 mg/dL — ABNORMAL HIGH (ref 70–99)
Glucose-Capillary: 143 mg/dL — ABNORMAL HIGH (ref 70–99)
Glucose-Capillary: 108 mg/dL — ABNORMAL HIGH (ref 70–99)
Glucose-Capillary: 122 mg/dL — ABNORMAL HIGH (ref 70–99)

## 2021-10-28 LAB — MRSA NEXT GEN BY PCR, NASAL: MRSA by PCR Next Gen: NOT DETECTED

## 2021-10-28 MED ORDER — POTASSIUM CHLORIDE 20 MEQ PO PACK
40.0000 meq | PACK | Freq: Once | ORAL | Status: AC
Start: 2021-10-28 — End: 2021-10-28
  Administered 2021-10-28: 40 meq
  Filled 2021-10-28: qty 2

## 2021-10-28 NOTE — Progress Notes (Signed)
Pt foley removed per order at 1800. No discharge noted on foley tip of penis. Peri-care completed. Pt due to void and pt assigned RN notified. Dionne Bucy RN ?
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
## 2021-10-28 NOTE — Progress Notes (Signed)
NEUROSURGERY PROGRESS NOTE ? ?No change in neuro status. No acute events overnight. Continue supportive care.  ? ?Temp:  [98.3 ?F (36.8 ?C)-99.8 ?F (37.7 ?C)] 99 ?F (37.2 ?C) (05/01 8828) ?Pulse Rate:  [92-122] 96 (05/01 0743) ?Resp:  [15-20] 17 (05/01 0743) ?BP: (110-120)/(68-81) 118/76 (05/01 0743) ?SpO2:  [96 %-100 %] 100 % (05/01 0743) ?FiO2 (%):  [21 %-28 %] 21 % (05/01 0743) ?Weight:  [84.9 kg] 84.9 kg (05/01 0500) ? ? ? ?Sherryl Manges, NP ?10/28/2021 ?9:59 AM ?  ?

## 2021-10-28 NOTE — Procedures (Signed)
Tracheostomy Change Note ? ?Patient Details:   ?Name: Sean Bruce ?DOB: 12-29-1995 ?MRN: [id number] ?   ?Airway Documentation: ?   ? ?Evaluation ? O2 sats: stable throughout ?Complications: No apparent complications ?Patient did tolerate procedure well. ?Bilateral Breath Sounds: Rhonchi ?  ? ?Newt Lukes ?10/28/2021, 4:49 PM ?
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
## 2021-10-29 LAB — GLUCOSE, CAPILLARY
Glucose-Capillary: 119 mg/dL — ABNORMAL HIGH (ref 70–99)
Glucose-Capillary: 103 mg/dL — ABNORMAL HIGH (ref 70–99)
Glucose-Capillary: 135 mg/dL — ABNORMAL HIGH (ref 70–99)
Glucose-Capillary: 145 mg/dL — ABNORMAL HIGH (ref 70–99)
Glucose-Capillary: 113 mg/dL — ABNORMAL HIGH (ref 70–99)
Glucose-Capillary: 101 mg/dL — ABNORMAL HIGH (ref 70–99)
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
## 2021-10-30 LAB — CBC
HCT: 31.3 % — ABNORMAL LOW (ref 39.0–52.0)
Hemoglobin: 9.7 g/dL — ABNORMAL LOW (ref 13.0–17.0)
MCH: 30.9 pg (ref 26.0–34.0)
MCHC: 31 g/dL (ref 30.0–36.0)
MCV: 99.7 fL (ref 80.0–100.0)
Platelets: 243 10*3/uL (ref 150–400)
RBC: 3.14 MIL/uL — ABNORMAL LOW (ref 4.22–5.81)
RDW: 15.5 % (ref 11.5–15.5)
WBC: 12.6 10*3/uL — ABNORMAL HIGH (ref 4.0–10.5)
nRBC: 0.6 % — ABNORMAL HIGH (ref 0.0–0.2)

## 2021-10-30 LAB — GLUCOSE, CAPILLARY
Glucose-Capillary: 110 mg/dL — ABNORMAL HIGH (ref 70–99)
Glucose-Capillary: 115 mg/dL — ABNORMAL HIGH (ref 70–99)
Glucose-Capillary: 121 mg/dL — ABNORMAL HIGH (ref 70–99)
Glucose-Capillary: 123 mg/dL — ABNORMAL HIGH (ref 70–99)
Glucose-Capillary: 130 mg/dL — ABNORMAL HIGH (ref 70–99)
Glucose-Capillary: 136 mg/dL — ABNORMAL HIGH (ref 70–99)

## 2021-10-30 MED ORDER — PIVOT 1.5 CAL PO LIQD
1000.0000 mL | ORAL | Status: DC
  Administered 2021-10-30 – 2021-11-19 (×27): 1000 mL
  Filled 2021-10-30 (×41): qty 1000

## 2021-10-30 NOTE — Progress Notes (Addendum)
Rt note. ?Patient capped at this time per PA Kabrich. ?Patient sat 100%, RR 15, HR 89. No labored breathing noted. Assessed 15 mins.  ?RT will continue to monitor, RN aware. ?
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
## 2021-10-31 LAB — CULTURE, BLOOD (ROUTINE X 2)
Culture: NO GROWTH
Culture: NO GROWTH

## 2021-10-31 LAB — GLUCOSE, CAPILLARY
Glucose-Capillary: 106 mg/dL — ABNORMAL HIGH (ref 70–99)
Glucose-Capillary: 112 mg/dL — ABNORMAL HIGH (ref 70–99)
Glucose-Capillary: 121 mg/dL — ABNORMAL HIGH (ref 70–99)
Glucose-Capillary: 122 mg/dL — ABNORMAL HIGH (ref 70–99)
Glucose-Capillary: 133 mg/dL — ABNORMAL HIGH (ref 70–99)
Glucose-Capillary: 144 mg/dL — ABNORMAL HIGH (ref 70–99)

## 2021-10-31 MED ORDER — MAGIC MOUTHWASH
5.0000 mL | Freq: Two times a day (BID) | ORAL | Status: DC
  Administered 2021-10-31 – 2021-11-24 (×37): 5 mL via ORAL
  Filled 2021-10-31 (×52): qty 5

## 2021-10-31 MED ORDER — SODIUM CHLORIDE 0.9 % IV SOLN
2.0000 g | Freq: Three times a day (TID) | INTRAVENOUS | Status: AC
  Administered 2021-10-31 (×2): 2 g via INTRAVENOUS
  Filled 2021-10-31 (×2): qty 12.5
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
## 2021-11-01 ENCOUNTER — Inpatient Hospital Stay (HOSPITAL_COMMUNITY): Payer: Medicaid Other

## 2021-11-01 DIAGNOSIS — Z515 Encounter for palliative care: Secondary | ICD-10-CM

## 2021-11-01 LAB — GLUCOSE, CAPILLARY
Glucose-Capillary: 112 mg/dL — ABNORMAL HIGH (ref 70–99)
Glucose-Capillary: 116 mg/dL — ABNORMAL HIGH (ref 70–99)
Glucose-Capillary: 143 mg/dL — ABNORMAL HIGH (ref 70–99)
Glucose-Capillary: 153 mg/dL — ABNORMAL HIGH (ref 70–99)
Glucose-Capillary: 160 mg/dL — ABNORMAL HIGH (ref 70–99)
Glucose-Capillary: 97 mg/dL (ref 70–99)

## 2021-11-01 LAB — CBC
HCT: 33.7 % — ABNORMAL LOW (ref 39.0–52.0)
Hemoglobin: 10.7 g/dL — ABNORMAL LOW (ref 13.0–17.0)
MCH: 31.7 pg (ref 26.0–34.0)
MCHC: 31.8 g/dL (ref 30.0–36.0)
MCV: 99.7 fL (ref 80.0–100.0)
Platelets: 223 10*3/uL (ref 150–400)
RBC: 3.38 MIL/uL — ABNORMAL LOW (ref 4.22–5.81)
RDW: 15.9 % — ABNORMAL HIGH (ref 11.5–15.5)
WBC: 12.6 10*3/uL — ABNORMAL HIGH (ref 4.0–10.5)
nRBC: 1 % — ABNORMAL HIGH (ref 0.0–0.2)
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
## 2021-11-02 ENCOUNTER — Inpatient Hospital Stay (HOSPITAL_COMMUNITY): Payer: Medicaid Other

## 2021-11-02 LAB — BASIC METABOLIC PANEL
Anion gap: 10 (ref 5–15)
BUN: 21 mg/dL — ABNORMAL HIGH (ref 6–20)
CO2: 23 mmol/L (ref 22–32)
Calcium: 9.7 mg/dL (ref 8.9–10.3)
Chloride: 105 mmol/L (ref 98–111)
Creatinine, Ser: 0.48 mg/dL — ABNORMAL LOW (ref 0.61–1.24)
GFR, Estimated: >60 mL/min (ref >60)
Glucose, Bld: 126 mg/dL — ABNORMAL HIGH (ref 70–99)
Potassium: 3.8 mmol/L (ref 3.5–5.1)
Sodium: 138 mmol/L (ref 135–145)

## 2021-11-02 LAB — GLUCOSE, CAPILLARY
Glucose-Capillary: 145 mg/dL — ABNORMAL HIGH (ref 70–99)
Glucose-Capillary: 108 mg/dL — ABNORMAL HIGH (ref 70–99)
Glucose-Capillary: 142 mg/dL — ABNORMAL HIGH (ref 70–99)
Glucose-Capillary: 131 mg/dL — ABNORMAL HIGH (ref 70–99)
Glucose-Capillary: 147 mg/dL — ABNORMAL HIGH (ref 70–99)
Glucose-Capillary: 140 mg/dL — ABNORMAL HIGH (ref 70–99)

## 2021-11-02 LAB — CBC
HCT: 38.9 % — ABNORMAL LOW (ref 39.0–52.0)
Hemoglobin: 11.9 g/dL — ABNORMAL LOW (ref 13.0–17.0)
MCH: 30.5 pg (ref 26.0–34.0)
MCHC: 30.6 g/dL (ref 30.0–36.0)
MCV: 99.7 fL (ref 80.0–100.0)
Platelets: 306 10*3/uL (ref 150–400)
RBC: 3.9 MIL/uL — ABNORMAL LOW (ref 4.22–5.81)
RDW: 15.9 % — ABNORMAL HIGH (ref 11.5–15.5)
WBC: 12.1 10*3/uL — ABNORMAL HIGH (ref 4.0–10.5)
nRBC: 0.6 % — ABNORMAL HIGH (ref 0.0–0.2)

## 2021-11-02 IMAGING — CT CT HEAD W/O CM
4 series · 15 of 47 positions shown, 17 images · non-contrast
Comparison: MRI yesterday.  CT [DATE].

CLINICAL DATA: Head trauma. Focal neurological finding. Fixed and
dilated pupils.



[Series 3: head wo · axial · 0.37mm/px · z∈[-256,-125]mm · 7 of 38 slices shown, 9 images]
[im 5/38  brain]
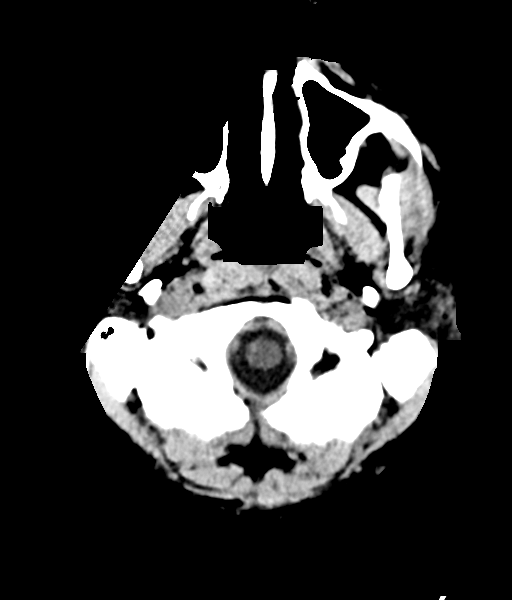
[im 5/38  bone]
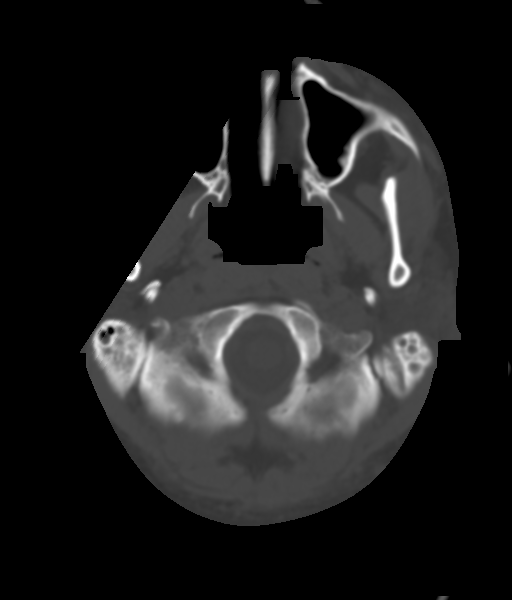
[im 10/38  brain]
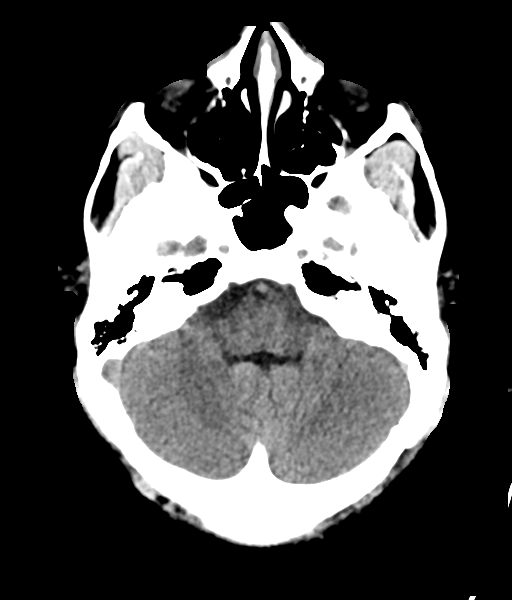
[im 14/38  brain]
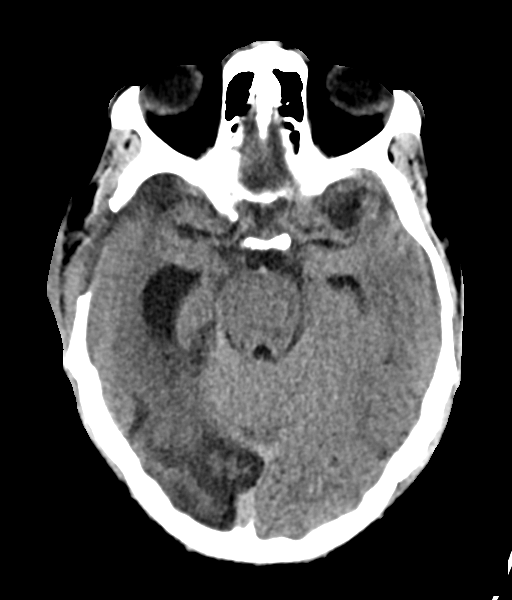
[im 19/38  brain]
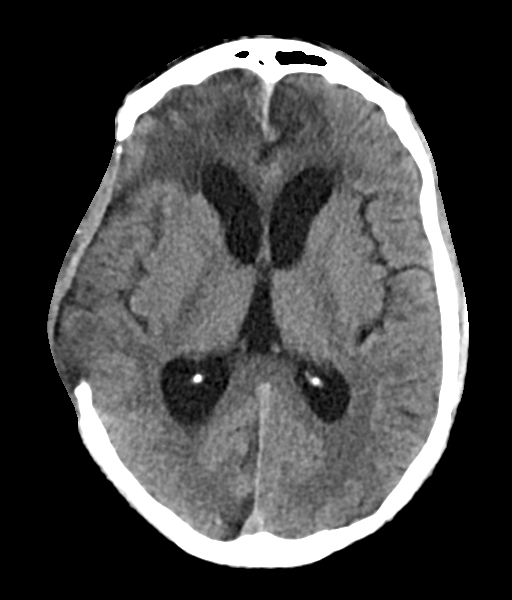
[im 24/38  brain]
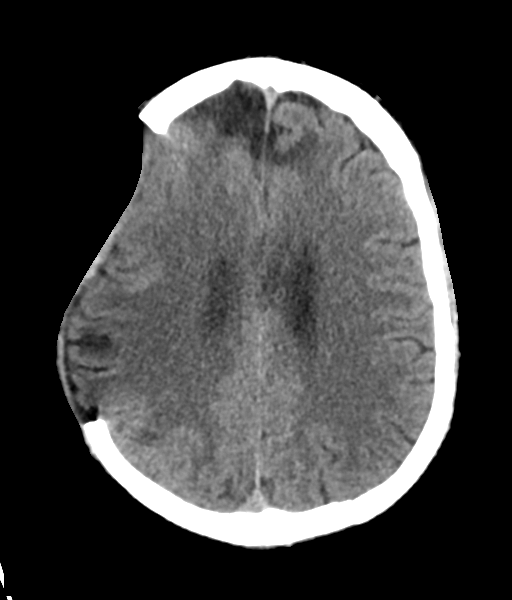
[im 24/38  bone]
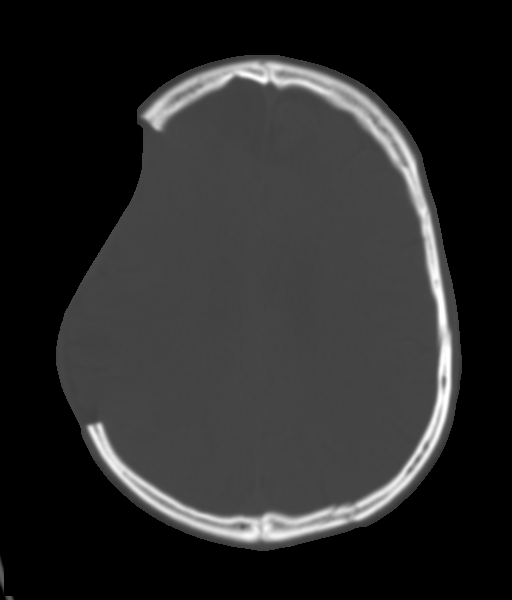
[im 28/38  brain]
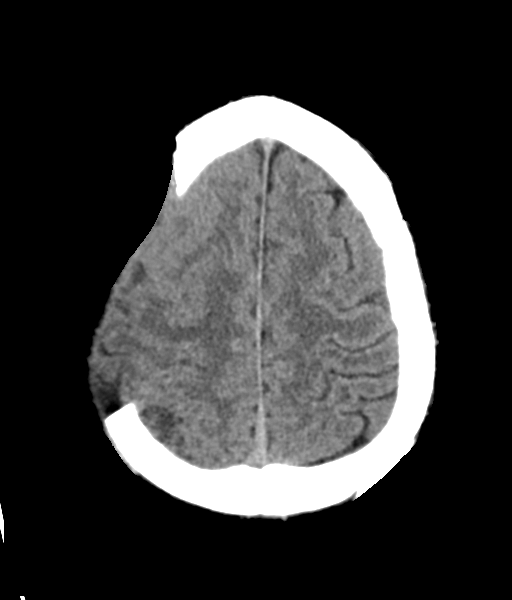
[im 33/38  brain]
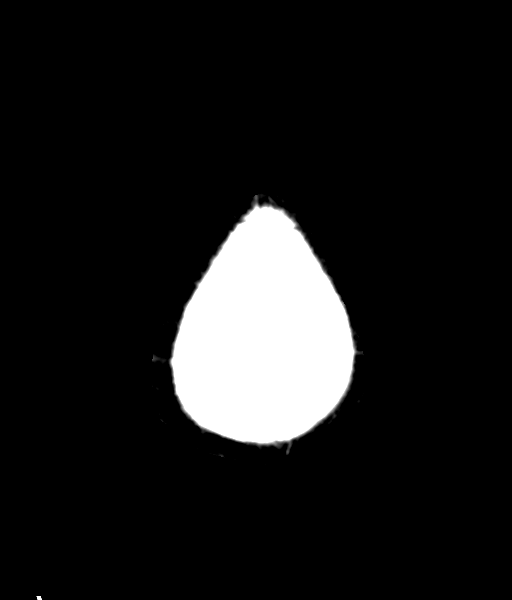

[Series 4: head bone · axial · 0.52mm/px · z∈[-202,-184]mm · 2 of 93 slices shown]
[im 10/93  bone]
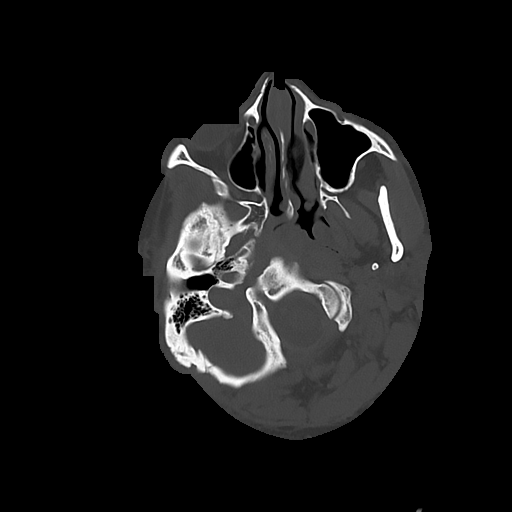
[im 19/93  bone]
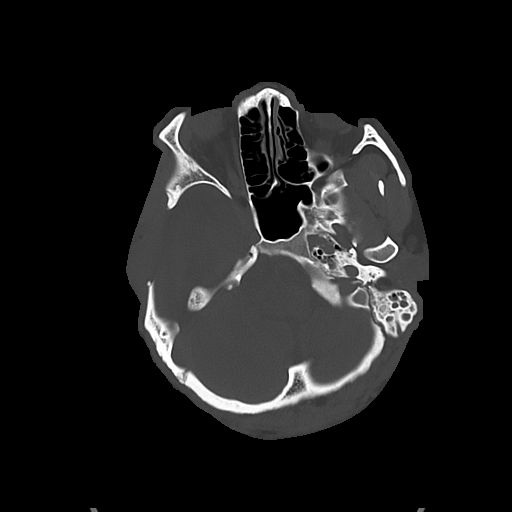

[Series 5: cor soft · coronal · 0.37mm/px · 3 of 75 slices shown]
[im 25/75  brain]
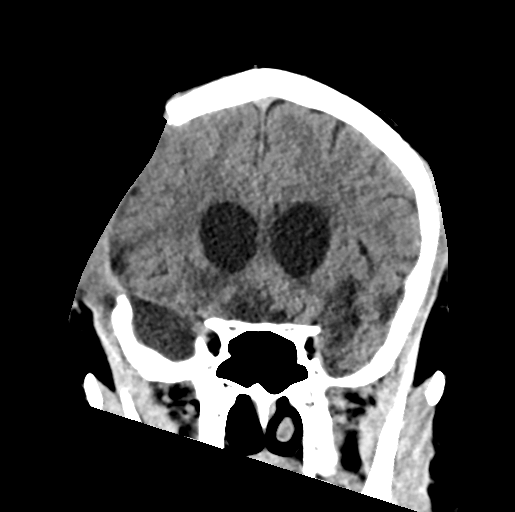
[im 33/75  brain]
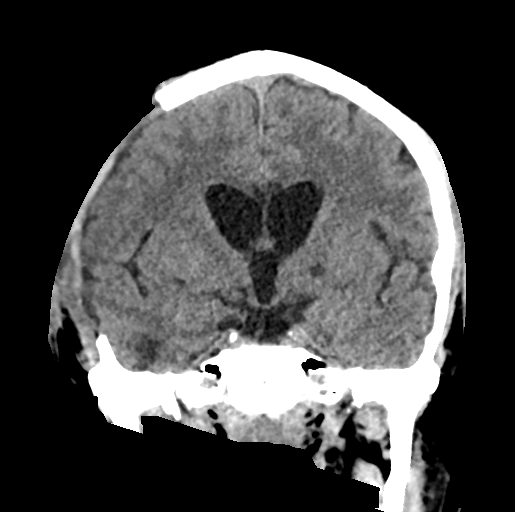
[im 42/75  brain]
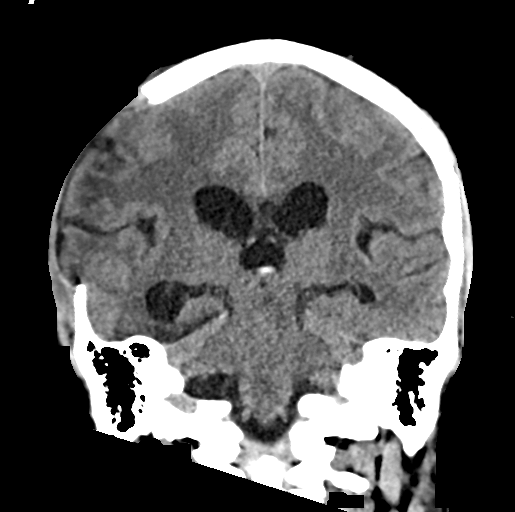

[Series 6: sag soft · sagittal · 0.37mm/px · 3 of 62 slices shown]
[im 27/62  brain]
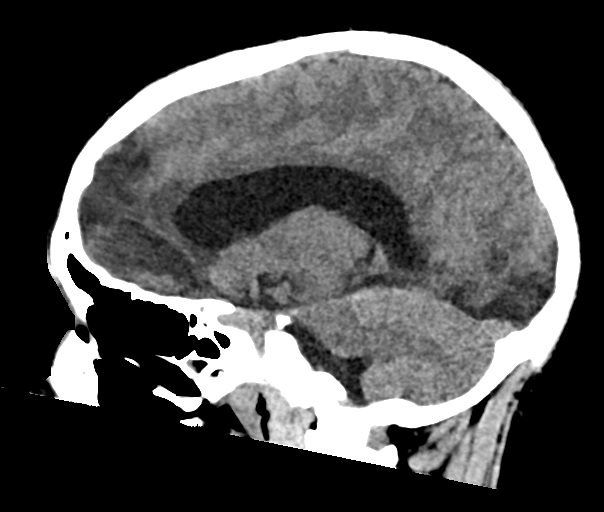
[im 30/62  brain]
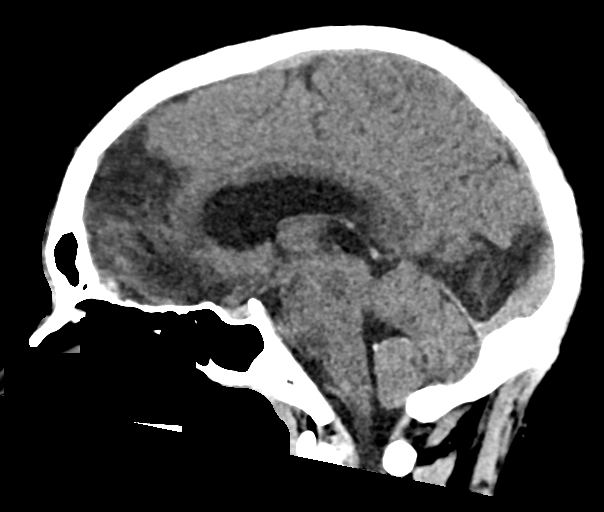
[im 34/62  brain]
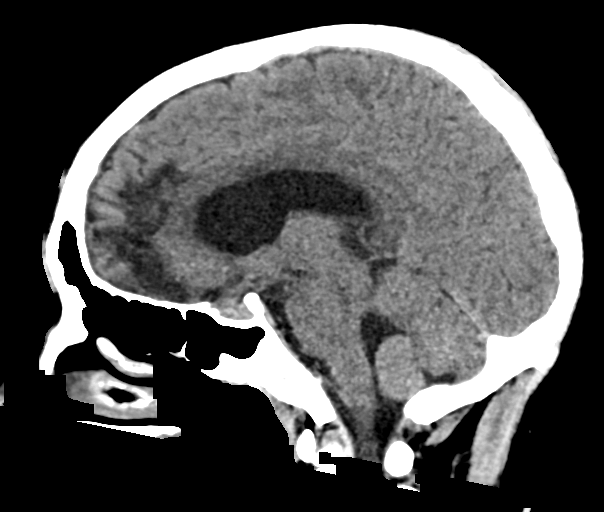

[15 of 47 positions shown; findings below may reference images not displayed]

FINDINGS: Brain: Low-density at the junction of the pons and midbrain
consistent with shear injury in that location. No focal cerebellar
finding by CT. Encephalomalacia in the right occipital lobe and
posterior temporal lobe. Encephalomalacia in both frontal lobes
right more than left and both temporal tips right more than left. No
hyperdense intraparenchymal blood products in those regions
presently. Focal encephalomalacia in the right posterior parietal
region. Tiny amount of diminishing subdural blood along the anterior
falx and along the anterior aspect of the middle cranial fossa on
the left, barely measurable. No new bleeding. No mass effect or
shift. No obstructive hydrocephalus.

Vascular: No primary vascular finding.

Skull: Large right frontoparietal craniectomy.  No bulging brain.

Sinuses/Orbits: Clear/normal

Other: Left mastoid effusion.
IMPRESSION: No new or worsening findings. Chronic findings of widespread closed
head injury as outlined above. Diminishing density of blood products
in the frontal lobes and in the subdural space. Decreased brain
swelling, now with some sinking in at the craniectomy site.

## 2021-11-02 NOTE — Progress Notes (Signed)
I was notified earlier this afternoon that the patient was noted to have fixed and dilated pupils, as well as new tachycardia. On exam the patient's pupils were dilated and nonreactive, but otherwise no neuro changes compared to exam this morning. Stat head CT showed no acute changes compared to prior. Labs unremarkable, and EKG with no ischemia. Tachycardia has since improved. Continue to monitor. ?

## 2021-11-02 NOTE — Progress Notes (Signed)
Pt's HR now 115, previously in the 80's. Monitor also alerting ST elevations. Notified Dr. Zenia Resides - orders for EKG and STAT labs.  ? ?Justice Rocher, RN ? ?
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
## 2021-11-02 NOTE — Progress Notes (Signed)
Pt had erection lasting approximately 30 minutes. At this time pt is also noted to have fixed pupils at 7 mm. Dr. Freida Busman was made aware and came to bedside. See new orders.  ? ?Robina Ade, RN ? ?
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
## 2021-11-03 LAB — GLUCOSE, CAPILLARY
Glucose-Capillary: 136 mg/dL — ABNORMAL HIGH (ref 70–99)
Glucose-Capillary: 138 mg/dL — ABNORMAL HIGH (ref 70–99)
Glucose-Capillary: 112 mg/dL — ABNORMAL HIGH (ref 70–99)
Glucose-Capillary: 125 mg/dL — ABNORMAL HIGH (ref 70–99)
Glucose-Capillary: 132 mg/dL — ABNORMAL HIGH (ref 70–99)
Glucose-Capillary: 133 mg/dL — ABNORMAL HIGH (ref 70–99)
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
## 2021-11-03 NOTE — Progress Notes (Signed)
Assessed inner cannula at this time, which was noted to be clean. No inner cannulas available at this time. RT asked secretary to order more for patient.  ?
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
## 2021-11-04 LAB — GLUCOSE, CAPILLARY
Glucose-Capillary: 141 mg/dL — ABNORMAL HIGH (ref 70–99)
Glucose-Capillary: 142 mg/dL — ABNORMAL HIGH (ref 70–99)
Glucose-Capillary: 158 mg/dL — ABNORMAL HIGH (ref 70–99)
Glucose-Capillary: 119 mg/dL — ABNORMAL HIGH (ref 70–99)
Glucose-Capillary: 172 mg/dL — ABNORMAL HIGH (ref 70–99)
Glucose-Capillary: 163 mg/dL — ABNORMAL HIGH (ref 70–99)

## 2021-11-04 MED ORDER — SODIUM CHLORIDE 0.9 % IV BOLUS
500.0000 mL | Freq: Once | INTRAVENOUS | Status: AC
  Administered 2021-11-04: 500 mL via INTRAVENOUS
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
## 2021-11-04 NOTE — Plan of Care (Signed)
Patient remains non-interactive. Does not track movement with eyes, withdraw from threat, or follow commands. Slight withdrawal from noxious stimuli to BUE, does not localize. No withdrawal in BLE elicited. Peg dependent for feeding. S/P trach decnnulation earlier today. 2L/min supplemental O2 applied as patient occasional and briefly expresses desaturation as low as 87% on RA. ST per tele monitor. 1/2 L NS bolus given overnight for ST per MD order. ? ? ?Problem: Education: ?Goal: Knowledge of General Education information will improve ?Description: Including pain rating scale, medication(s)/side effects and non-pharmacologic comfort measures ?Outcome: Not Progressing ?  ?Problem: Health Behavior/Discharge Planning: ?Goal: Ability to manage health-related needs will improve ?Outcome: Not Progressing ?  ?Problem: Clinical Measurements: ?Goal: Ability to maintain clinical measurements within normal limits will improve ?Outcome: Not Progressing ?Goal: Will remain free from infection ?Outcome: Not Progressing ?Goal: Diagnostic test results will improve ?Outcome: Not Progressing ?Goal: Respiratory complications will improve ?Outcome: Not Progressing ?Goal: Cardiovascular complication will be avoided ?Outcome: Not Progressing ?  ?Problem: Activity: ?Goal: Risk for activity intolerance will decrease ?Outcome: Not Progressing ?  ?Problem: Nutrition: ?Goal: Adequate nutrition will be maintained ?Outcome: Not Progressing ?  ?Problem: Coping: ?Goal: Level of anxiety will decrease ?Outcome: Not Progressing ?  ?Problem: Elimination: ?Goal: Will not experience complications related to bowel motility ?Outcome: Not Progressing ?Goal: Will not experience complications related to urinary retention ?Outcome: Not Progressing ?  ?Problem: Pain Managment: ?Goal: General experience of comfort will improve ?Outcome: Not Progressing ?  ?Problem: Safety: ?Goal: Ability to remain free from injury will improve ?Outcome: Not Progressing ?   ?Problem: Skin Integrity: ?Goal: Risk for impaired skin integrity will decrease ?Outcome: Not Progressing ?  ? ?
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
## 2021-11-04 NOTE — Progress Notes (Signed)
1929 hrs: page sent to Dr. Janee Morn: "[id number] Raul Del. Tachy in 120s-130s. Call parameters for HR > 120. No obvious distress. Please advise. Thanks. Madelin Rear, RN". Dr. Janee Morn returned call. VO placed for 500 cc NS bolus. No change to call parameters. ?
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
## 2021-11-04 NOTE — Progress Notes (Signed)
Pt was decannulated prior to RT assessment by trauma team. Pt stoma site is clean, dry, and covered by gauze. Pt is stable at this time on RA. RT will monitor.  ?

## 2021-11-05 LAB — CBC
HCT: 39 % (ref 39.0–52.0)
Hemoglobin: 12 g/dL — ABNORMAL LOW (ref 13.0–17.0)
MCH: 30.8 pg (ref 26.0–34.0)
MCHC: 30.8 g/dL (ref 30.0–36.0)
MCV: 100 fL (ref 80.0–100.0)
Platelets: 304 10*3/uL (ref 150–400)
RBC: 3.9 MIL/uL — ABNORMAL LOW (ref 4.22–5.81)
RDW: 15.5 % (ref 11.5–15.5)
WBC: 13.2 10*3/uL — ABNORMAL HIGH (ref 4.0–10.5)
nRBC: 0 % (ref 0.0–0.2)

## 2021-11-05 LAB — GLUCOSE, CAPILLARY
Glucose-Capillary: 158 mg/dL — ABNORMAL HIGH (ref 70–99)
Glucose-Capillary: 136 mg/dL — ABNORMAL HIGH (ref 70–99)
Glucose-Capillary: 133 mg/dL — ABNORMAL HIGH (ref 70–99)
Glucose-Capillary: 145 mg/dL — ABNORMAL HIGH (ref 70–99)
Glucose-Capillary: 138 mg/dL — ABNORMAL HIGH (ref 70–99)
Glucose-Capillary: 136 mg/dL — ABNORMAL HIGH (ref 70–99)

## 2021-11-05 NOTE — Progress Notes (Signed)
Rt at bedside to assess pt post decannulation. Pt is stable on 2L. Gauze is secure and clean over stoma site. No complications noted at this time. Rt will monitor.  ?
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
## 2021-11-06 LAB — GLUCOSE, CAPILLARY
Glucose-Capillary: 139 mg/dL — ABNORMAL HIGH (ref 70–99)
Glucose-Capillary: 107 mg/dL — ABNORMAL HIGH (ref 70–99)
Glucose-Capillary: 130 mg/dL — ABNORMAL HIGH (ref 70–99)
Glucose-Capillary: 103 mg/dL — ABNORMAL HIGH (ref 70–99)
Glucose-Capillary: 148 mg/dL — ABNORMAL HIGH (ref 70–99)
Glucose-Capillary: 136 mg/dL — ABNORMAL HIGH (ref 70–99)

## 2021-11-06 LAB — URINALYSIS, ROUTINE W REFLEX MICROSCOPIC
Bilirubin Urine: NEGATIVE
Glucose, UA: NEGATIVE mg/dL
Hgb urine dipstick: NEGATIVE
Ketones, ur: NEGATIVE mg/dL
Leukocytes,Ua: NEGATIVE
Nitrite: NEGATIVE
Protein, ur: NEGATIVE mg/dL
Specific Gravity, Urine: 1.029 (ref 1.005–1.030)
pH: 5 (ref 5.0–8.0)

## 2021-11-06 MED ORDER — METOPROLOL TARTRATE 25 MG/10 ML ORAL SUSPENSION
25.0000 mg | Freq: Two times a day (BID) | ORAL | Status: DC
  Administered 2021-11-06 – 2021-11-13 (×15): 25 mg
  Filled 2021-11-06 (×15): qty 10

## 2021-11-06 NOTE — Progress Notes (Signed)
ST elevation noted on monitor.  No changes in pt's condition.  Workup done on 5/6 for same.  Dr. Dwain Sarna advised.  Per Dr. Dwain Sarna, continue to monitor pt closely and report any changes in condition, no need to repeat workup at this time.  ?
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
## 2021-11-07 LAB — GLUCOSE, CAPILLARY
Glucose-Capillary: 147 mg/dL — ABNORMAL HIGH (ref 70–99)
Glucose-Capillary: 125 mg/dL — ABNORMAL HIGH (ref 70–99)
Glucose-Capillary: 132 mg/dL — ABNORMAL HIGH (ref 70–99)
Glucose-Capillary: 130 mg/dL — ABNORMAL HIGH (ref 70–99)
Glucose-Capillary: 120 mg/dL — ABNORMAL HIGH (ref 70–99)
Glucose-Capillary: 124 mg/dL — ABNORMAL HIGH (ref 70–99)
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
## 2021-11-08 LAB — GLUCOSE, CAPILLARY
Glucose-Capillary: 120 mg/dL — ABNORMAL HIGH (ref 70–99)
Glucose-Capillary: 123 mg/dL — ABNORMAL HIGH (ref 70–99)
Glucose-Capillary: 132 mg/dL — ABNORMAL HIGH (ref 70–99)
Glucose-Capillary: 134 mg/dL — ABNORMAL HIGH (ref 70–99)
Glucose-Capillary: 135 mg/dL — ABNORMAL HIGH (ref 70–99)
Glucose-Capillary: 144 mg/dL — ABNORMAL HIGH (ref 70–99)
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
## 2021-11-09 LAB — GLUCOSE, CAPILLARY
Glucose-Capillary: 142 mg/dL — ABNORMAL HIGH (ref 70–99)
Glucose-Capillary: 130 mg/dL — ABNORMAL HIGH (ref 70–99)
Glucose-Capillary: 183 mg/dL — ABNORMAL HIGH (ref 70–99)
Glucose-Capillary: 153 mg/dL — ABNORMAL HIGH (ref 70–99)
Glucose-Capillary: 126 mg/dL — ABNORMAL HIGH (ref 70–99)
Glucose-Capillary: 168 mg/dL — ABNORMAL HIGH (ref 70–99)
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
## 2021-11-09 NOTE — Plan of Care (Signed)
Patient remains dependent for ADLs. Remains on tube feeds via PEG. Functionally quadraplegic.  ? ? ?Problem: Education: ?Goal: Knowledge of General Education information will improve ?Description: Including pain rating scale, medication(s)/side effects and non-pharmacologic comfort measures ?Outcome: Progressing ?  ?Problem: Health Behavior/Discharge Planning: ?Goal: Ability to manage health-related needs will improve ?Outcome: Progressing ?  ?Problem: Clinical Measurements: ?Goal: Ability to maintain clinical measurements within normal limits will improve ?Outcome: Progressing ?Goal: Will remain free from infection ?Outcome: Progressing ?Goal: Diagnostic test results will improve ?Outcome: Progressing ?Goal: Respiratory complications will improve ?Outcome: Progressing ?Goal: Cardiovascular complication will be avoided ?Outcome: Progressing ?  ?Problem: Activity: ?Goal: Risk for activity intolerance will decrease ?Outcome: Progressing ?  ?Problem: Nutrition: ?Goal: Adequate nutrition will be maintained ?Outcome: Progressing ?  ?Problem: Coping: ?Goal: Level of anxiety will decrease ?Outcome: Progressing ?  ?Problem: Elimination: ?Goal: Will not experience complications related to bowel motility ?Outcome: Progressing ?Goal: Will not experience complications related to urinary retention ?Outcome: Progressing ?  ?Problem: Pain Managment: ?Goal: General experience of comfort will improve ?Outcome: Progressing ?  ?Problem: Safety: ?Goal: Ability to remain free from injury will improve ?Outcome: Progressing ?  ?Problem: Skin Integrity: ?Goal: Risk for impaired skin integrity will decrease ?Outcome: Progressing ?  ?

## 2021-11-10 LAB — GLUCOSE, CAPILLARY
Glucose-Capillary: 147 mg/dL — ABNORMAL HIGH (ref 70–99)
Glucose-Capillary: 139 mg/dL — ABNORMAL HIGH (ref 70–99)
Glucose-Capillary: 187 mg/dL — ABNORMAL HIGH (ref 70–99)
Glucose-Capillary: 143 mg/dL — ABNORMAL HIGH (ref 70–99)
Glucose-Capillary: 159 mg/dL — ABNORMAL HIGH (ref 70–99)

## 2021-11-10 NOTE — Progress Notes (Signed)
Patients mother is at the bedside, she came back this evening. She would love to speak with Raynelle Fanning about options. ?

## 2021-11-10 NOTE — Progress Notes (Signed)
PT remains with MEWS color yellow driven by tachycardia 100-120s. PT non interactive but does not outwardly appear in pain. PT to receive his scheduled Metoprolol shortly.  ? ? ? ? 11/10/21 0716  ?Assess: MEWS Score  ?Temp 99.9 ?F (37.7 ?C)  ?BP (!) 135/92  ?Pulse Rate (!) 117  ?ECG Heart Rate (!) 118  ?Resp 17  ?Level of Consciousness Alert  ?SpO2 97 %  ?O2 Device Nasal Cannula  ?O2 Flow Rate (L/min) 2 L/min  ?Assess: MEWS Score  ?MEWS Temp 0  ?MEWS Systolic 0  ?MEWS Pulse 2  ?MEWS RR 0  ?MEWS LOC 0  ?MEWS Score 2  ?MEWS Score Color Yellow  ?Assess: if the MEWS score is Yellow or Red  ?Were vital signs taken at a resting state? Yes  ?Focused Assessment No change from prior assessment  ?Early Detection of Sepsis Score *See Row Information* Low  ?MEWS guidelines implemented *See Row Information* No, previously yellow, continue vital signs every 4 hours  ?Treat  ?Facial Expression 0  ?Body Movements 0  ?Muscle Tension 0  ?Compliance with ventilator (intubated pts.) N/A  ?Vocalization (extubated pts.) 0  ?CPOT Total 0  ? ? ?
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
## 2021-11-11 ENCOUNTER — Inpatient Hospital Stay (HOSPITAL_COMMUNITY): Payer: Medicaid Other

## 2021-11-11 ENCOUNTER — Encounter (HOSPITAL_COMMUNITY): Payer: Self-pay

## 2021-11-11 LAB — URINALYSIS, ROUTINE W REFLEX MICROSCOPIC
Bilirubin Urine: NEGATIVE
Glucose, UA: NEGATIVE mg/dL
Hgb urine dipstick: NEGATIVE
Ketones, ur: NEGATIVE mg/dL
Leukocytes,Ua: NEGATIVE
Nitrite: NEGATIVE
Protein, ur: NEGATIVE mg/dL
Specific Gravity, Urine: 1.034 — ABNORMAL HIGH (ref 1.005–1.030)
pH: 5 (ref 5.0–8.0)

## 2021-11-11 LAB — CBC
HCT: 43.1 % (ref 39.0–52.0)
Hemoglobin: 12.9 g/dL — ABNORMAL LOW (ref 13.0–17.0)
MCH: 30.1 pg (ref 26.0–34.0)
MCHC: 29.9 g/dL — ABNORMAL LOW (ref 30.0–36.0)
MCV: 100.5 fL — ABNORMAL HIGH (ref 80.0–100.0)
Platelets: 246 10*3/uL (ref 150–400)
RBC: 4.29 MIL/uL (ref 4.22–5.81)
RDW: 14.8 % (ref 11.5–15.5)
WBC: 12.6 10*3/uL — ABNORMAL HIGH (ref 4.0–10.5)
nRBC: 0 % (ref 0.0–0.2)

## 2021-11-11 LAB — GLUCOSE, CAPILLARY
Glucose-Capillary: 133 mg/dL — ABNORMAL HIGH (ref 70–99)
Glucose-Capillary: 133 mg/dL — ABNORMAL HIGH (ref 70–99)
Glucose-Capillary: 135 mg/dL — ABNORMAL HIGH (ref 70–99)
Glucose-Capillary: 135 mg/dL — ABNORMAL HIGH (ref 70–99)
Glucose-Capillary: 147 mg/dL — ABNORMAL HIGH (ref 70–99)
Glucose-Capillary: 148 mg/dL — ABNORMAL HIGH (ref 70–99)
Glucose-Capillary: 150 mg/dL — ABNORMAL HIGH (ref 70–99)

## 2021-11-11 IMAGING — DX DG CHEST 1V PORT
1 series · 1 of 1 positions shown · non-contrast
Comparison: [DATE].

CLINICAL DATA: 25-year-old male presenting with fever of unknown
origin.

EXAM:
PORTABLE CHEST 1 VIEW

[chest ap]
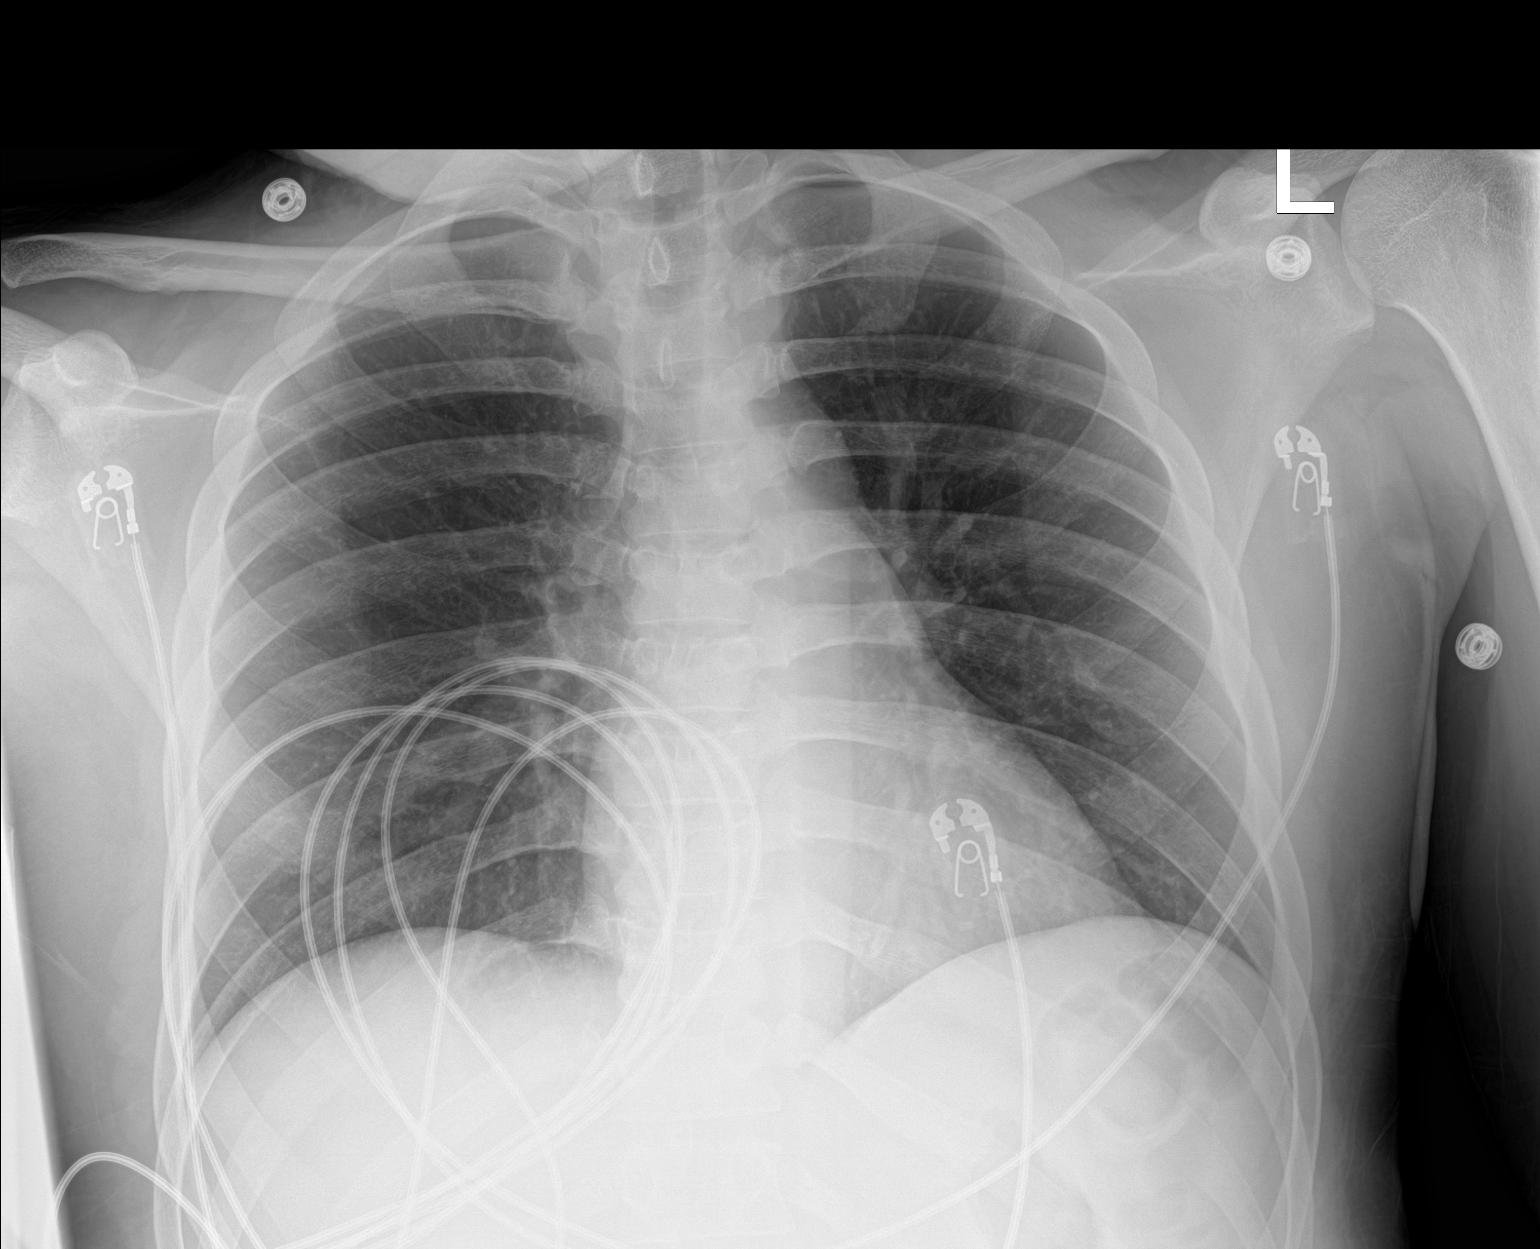

[1 of 1 positions shown; findings below may reference images not displayed]

FINDINGS: EKG leads project over the chest.

Lungs are well inflated.

Cardiomediastinal contours and hilar structures are normal.

No lobar consolidation. No sign of pleural effusion or visible
pneumothorax.

On limited assessment there is no acute skeletal finding.
IMPRESSION: No acute cardiopulmonary disease.

## 2021-11-11 MED ORDER — SODIUM CHLORIDE 0.9 % IV SOLN: 1.0000 g | Freq: Three times a day (TID) | INTRAVENOUS | Status: DC

## 2021-11-11 MED ORDER — SODIUM CHLORIDE 0.9 % IV SOLN
1.0000 g | Freq: Three times a day (TID) | INTRAVENOUS | Status: AC
  Administered 2021-11-11 – 2021-11-16 (×15): 1 g via INTRAVENOUS
  Filled 2021-11-11 (×16): qty 10
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
## 2021-11-12 ENCOUNTER — Encounter (HOSPITAL_COMMUNITY): Payer: Self-pay

## 2021-11-12 ENCOUNTER — Inpatient Hospital Stay (HOSPITAL_COMMUNITY): Payer: Medicaid Other

## 2021-11-12 LAB — URINE CULTURE: Culture: NO GROWTH

## 2021-11-12 LAB — GLUCOSE, CAPILLARY
Glucose-Capillary: 100 mg/dL — ABNORMAL HIGH (ref 70–99)
Glucose-Capillary: 137 mg/dL — ABNORMAL HIGH (ref 70–99)
Glucose-Capillary: 141 mg/dL — ABNORMAL HIGH (ref 70–99)
Glucose-Capillary: 147 mg/dL — ABNORMAL HIGH (ref 70–99)
Glucose-Capillary: 149 mg/dL — ABNORMAL HIGH (ref 70–99)
Glucose-Capillary: 175 mg/dL — ABNORMAL HIGH (ref 70–99)

## 2021-11-12 IMAGING — DX DG ABDOMEN 1V
1 series · 1 of 1 positions shown · non-contrast
Comparison: [DATE]

CLINICAL DATA: Gastrostomy tube check

EXAM:
ABDOMEN - 1 VIEW

[abdomen]
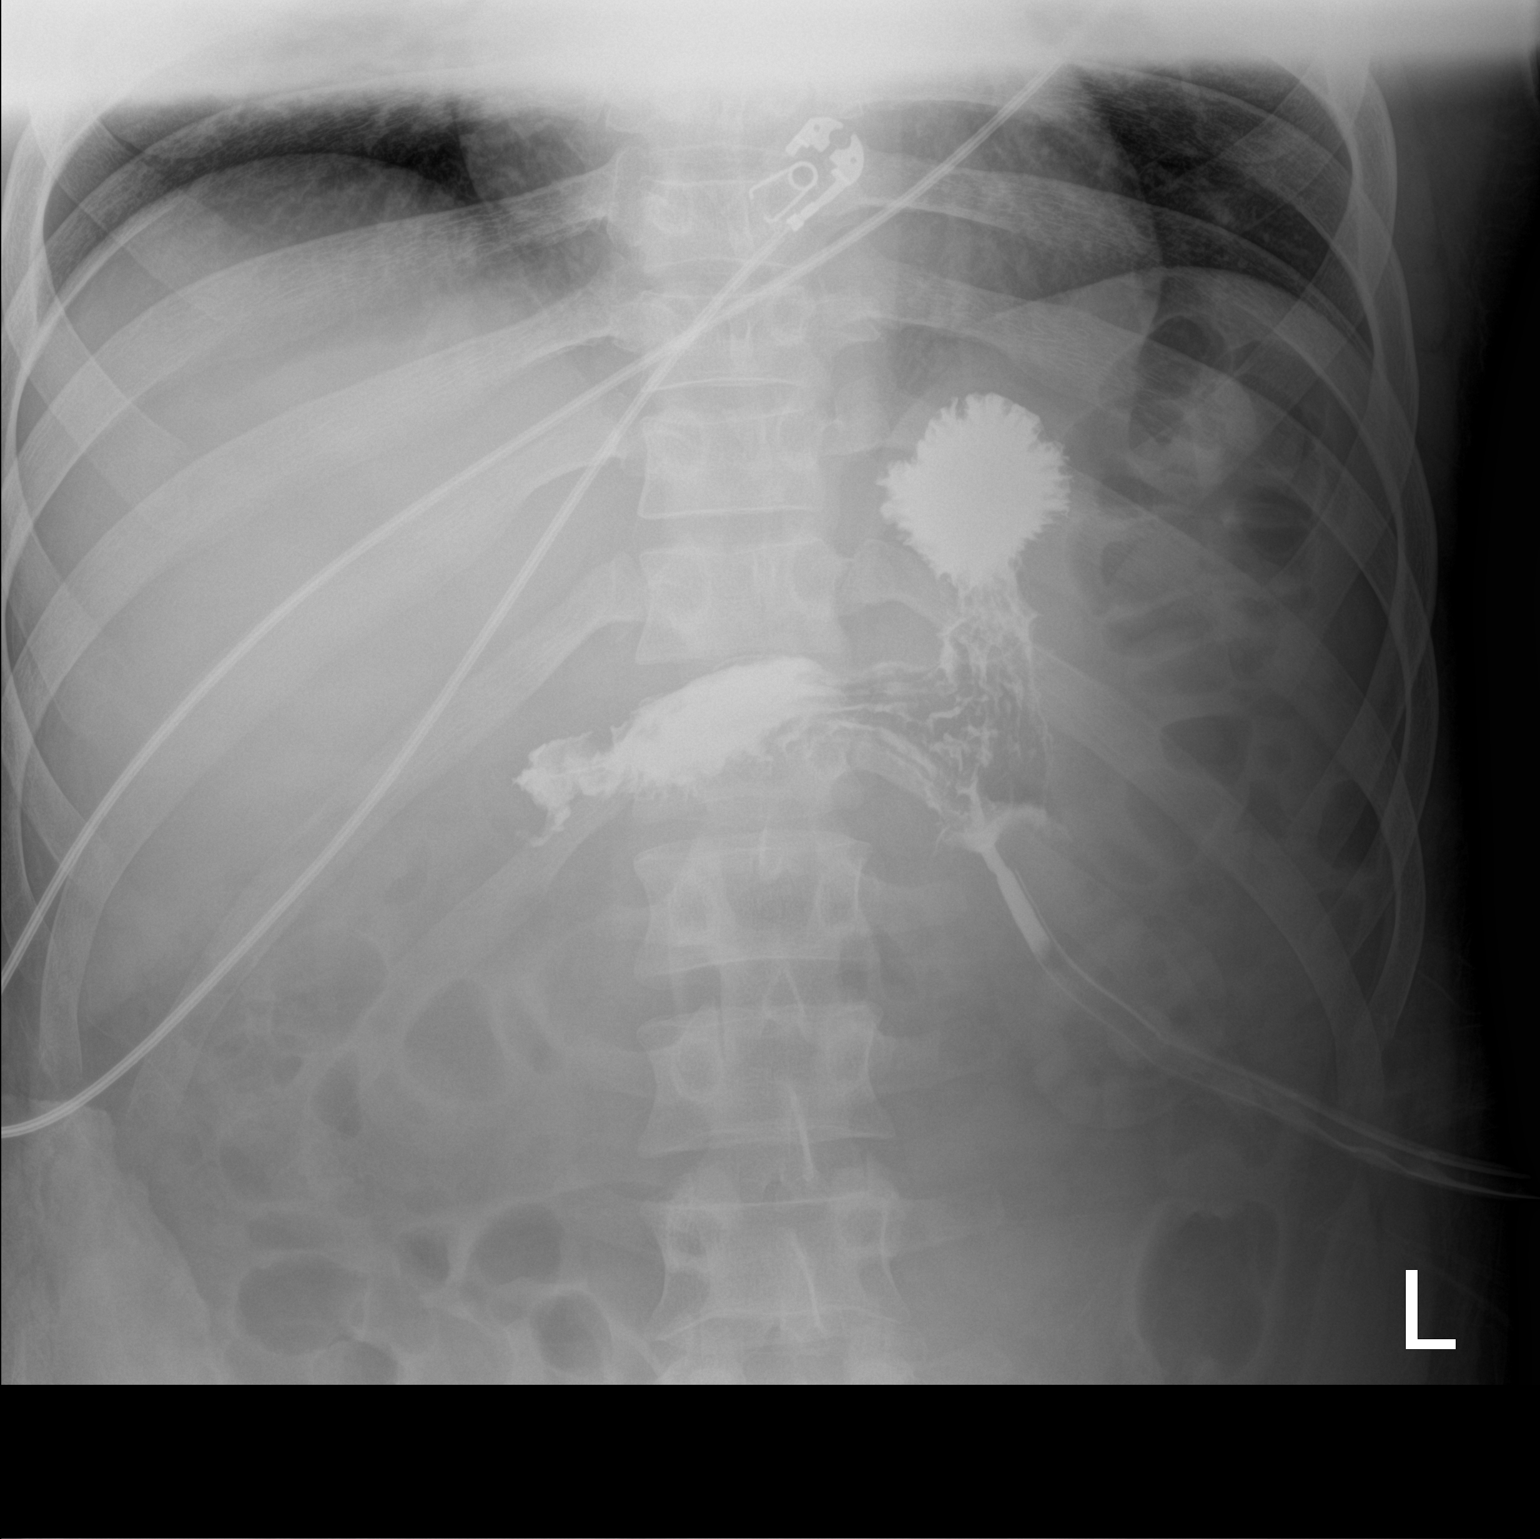

[1 of 1 positions shown; findings below may reference images not displayed]

FINDINGS: 30 ML Gastrografin with 20 mL of sterile water was administered
through patient's gastrostomy tube. Tube is located within the
gastric body. Contrast is seen filling the gastric lumen. No
extraluminal contrast collections. No evidence of outlet
obstruction. Visualized bowel gas pattern is nonobstructive.
IMPRESSION: Appropriately positioned gastrostomy tube.

## 2021-11-12 MED ORDER — DIATRIZOATE MEGLUMINE & SODIUM 66-10 % PO SOLN
ORAL | Status: AC
  Filled 2021-11-12: qty 30
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
## 2021-11-12 NOTE — TOC Progression Note (Addendum)
Transition of Care (TOC) - Progression Note  ? ? ?Patient Details  ?Name: Jhaden Pizzuto ?MRN: [id number] ?Date of Birth: 1996/01/28 ? ?Transition of Care (TOC) CM/SW Contact  ?Ella Bodo, RN ?Phone Number: ?11/12/2021, 12:05pm ? ?Clinical Narrative:    ?Met with patient's mother, Lisbeth Renshaw, to provide updates for discharge planning.  Currently patient has been faxed out to SNF facilities that are accepting LOGs, but there are no bed offers presently.  Medicaid is still pending; will send email to [place] in financial counseling who is following his case.  Mother given trauma resource packet, as she feels she needs psychological counseling.  She inquired about potential [facility] referral; patient is uninsured, and unable to participate in therapies.  Explained in detail to mother why patient is not a candidate for acute inpatient rehab, and she verbalizes understanding of this.  Mother appreciative of my visit; will continue to provide updates as available. ? ?Addendum: 4:45 PM ?Left messages for [facility], [facility], and [facility], requesting follow-up on referrals sent last week.  Spoke with Gayla Medicus with [facility]: She states can accept referral for possible long-term care bed at a [facility] facility.  Referral sent as requested for review.  Will follow with updates as available. ? ?Expected Discharge Plan: [place] ?Barriers to Discharge: Continued Medical Work up ? ?Expected Discharge Plan and Services ?Expected Discharge Plan: [place] ?  ?Discharge Planning Services: CM Consult ?  ?Living arrangements for the past 2 months: [place] ?                ?  ?  ?  ?  ?  ?  ?  ?  ?  ?  ? ? ?Social Determinants of Health (SDOH) Interventions ?  ? ?Readmission Risk Interventions ?   ? View : No data to display.  ?  ?  ?  ? ?Reinaldo Raddle, RN, BSN  ?Trauma/Neuro ICU Case Manager ?[phone] ? ? ?
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
## 2021-11-13 ENCOUNTER — Inpatient Hospital Stay (HOSPITAL_COMMUNITY): Payer: Medicaid Other

## 2021-11-13 DIAGNOSIS — R509 Fever, unspecified: Secondary | ICD-10-CM

## 2021-11-13 LAB — GLUCOSE, CAPILLARY
Glucose-Capillary: 133 mg/dL — ABNORMAL HIGH (ref 70–99)
Glucose-Capillary: 137 mg/dL — ABNORMAL HIGH (ref 70–99)
Glucose-Capillary: 140 mg/dL — ABNORMAL HIGH (ref 70–99)
Glucose-Capillary: 130 mg/dL — ABNORMAL HIGH (ref 70–99)
Glucose-Capillary: 120 mg/dL — ABNORMAL HIGH (ref 70–99)
Glucose-Capillary: 152 mg/dL — ABNORMAL HIGH (ref 70–99)

## 2021-11-13 LAB — CBC
HCT: 43.1 % (ref 39.0–52.0)
Hemoglobin: 13 g/dL (ref 13.0–17.0)
MCH: 30.4 pg (ref 26.0–34.0)
MCHC: 30.2 g/dL (ref 30.0–36.0)
MCV: 100.7 fL — ABNORMAL HIGH (ref 80.0–100.0)
Platelets: 217 10*3/uL (ref 150–400)
RBC: 4.28 MIL/uL (ref 4.22–5.81)
RDW: 14.6 % (ref 11.5–15.5)
WBC: 12.4 10*3/uL — ABNORMAL HIGH (ref 4.0–10.5)
nRBC: 0 % (ref 0.0–0.2)

## 2021-11-13 MED ORDER — METOPROLOL TARTRATE 25 MG/10 ML ORAL SUSPENSION
50.0000 mg | Freq: Two times a day (BID) | ORAL | Status: DC
  Administered 2021-11-13 – 2021-11-22 (×18): 50 mg
  Filled 2021-11-13 (×18): qty 20

## 2021-11-13 NOTE — Progress Notes (Signed)
Bilateral lower extremity venous duplex has been completed. ?Preliminary results can be found in CV Proc through chart review.  ? ?11/13/21 1:21 PM ?Carlos Levering RVT   ?
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
## 2021-11-14 ENCOUNTER — Inpatient Hospital Stay (HOSPITAL_COMMUNITY): Payer: Medicaid Other

## 2021-11-14 DIAGNOSIS — T1490XA Injury, unspecified, initial encounter: Secondary | ICD-10-CM

## 2021-11-14 LAB — GLUCOSE, CAPILLARY
Glucose-Capillary: 140 mg/dL — ABNORMAL HIGH (ref 70–99)
Glucose-Capillary: 152 mg/dL — ABNORMAL HIGH (ref 70–99)
Glucose-Capillary: 159 mg/dL — ABNORMAL HIGH (ref 70–99)
Glucose-Capillary: 120 mg/dL — ABNORMAL HIGH (ref 70–99)
Glucose-Capillary: 141 mg/dL — ABNORMAL HIGH (ref 70–99)
Glucose-Capillary: 132 mg/dL — ABNORMAL HIGH (ref 70–99)
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
## 2021-11-14 NOTE — Progress Notes (Signed)
BUE venous duplex has been completed.   Results can be found under chart review under CV PROC. 11/14/2021 3:50 PM Zola Runion RVT, RDMS

## 2021-11-14 NOTE — Progress Notes (Signed)
Progress Note  31 Days Post-Op  Subjective: Fever curve improving. Still no identified source. LE duplexes negative yesterday. Urine and blood cxs negative to date. No family at bedside at time of exam this AM.   Objective: Vital signs in last 24 hours: Temp:  [98 F (36.7 C)-100.5 F (38.1 C)] 100.5 F (38.1 C) (05/18 0757) Pulse Rate:  [108-119] 110 (05/18 0757) Resp:  [17-18] 17 (05/18 0757) BP: (130-141)/(89-96) 134/92 (05/18 0757) SpO2:  [95 %-100 %] 96 % (05/18 0757) Last BM Date : 11/12/21  Intake/Output from previous day: 05/17 0701 - 05/18 0700 In: -  Out: 2025 [Urine:2025] Intake/Output this shift: No intake/output data recorded.  PE: General: pleasant, WD, [name] who is laying in bed in NAD HEENT: R crani site well healed with sunken skull Heart: sinus tachycardia in the 110s. Palpable radial and pedal pulses bilaterally Lungs: CTAB, no wheezes, rhonchi, or rales noted.  Respiratory effort nonlabored. Previous trach site with healthy granulation tissue Abd: soft, NT, ND, +BS, G-tube in place and tolerating TF, scalp in R hemiabdomen MS: all 4 extremities are symmetrical with no cyanosis, clubbing, or edema. Skin: warm and dry with no masses, lesions, or rashes Neuro: does not regard or follow commands, opens eyes with tactile stimulation    Lab Results:  Recent Labs    11/11/21 1250 11/13/21 0140  WBC 12.6* 12.4*  HGB 12.9* 13.0  HCT 43.1 43.1  PLT 246 217    BMET No results for input(s): NA, K, CL, CO2, GLUCOSE, BUN, CREATININE, CALCIUM in the last 72 hours. PT/INR No results for input(s): LABPROT, INR in the last 72 hours. CMP     Component Value Date/Time   NA 138 11/02/2021 1327   K 3.8 11/02/2021 1327   CL 105 11/02/2021 1327   CO2 23 11/02/2021 1327   GLUCOSE 126 (H) 11/02/2021 1327   BUN 21 (H) 11/02/2021 1327   CREATININE 0.48 (L) 11/02/2021 1327   CALCIUM 9.7 11/02/2021 1327   PROT 6.2 (L) 09/25/2021 2308   ALBUMIN 3.9 09/25/2021  2308   AST 37 09/25/2021 2308   ALT 39 09/25/2021 2308   ALKPHOS 61 09/25/2021 2308   BILITOT 1.2 09/25/2021 2308   GFRNONAA >60 11/02/2021 1327   Lipase  No results found for: LIPASE     Studies/Results: DG ABDOMEN PEG TUBE LOCATION  Result Date: 11/12/2021 CLINICAL DATA:  Gastrostomy tube check EXAM: ABDOMEN - 1 VIEW COMPARISON:  09/27/2021 FINDINGS: 30 ML Gastrografin with 20 mL of sterile water was administered through patient's gastrostomy tube. Tube is located within the gastric body. Contrast is seen filling the gastric lumen. No extraluminal contrast collections. No evidence of outlet obstruction. Visualized bowel gas pattern is nonobstructive. IMPRESSION: Appropriately positioned gastrostomy tube. Electronically Signed   By: Duanne GuessNicholas  Plundo D.O.   On: 11/12/2021 15:31   VAS US LOWER EXTREMITY VENOUS (DVT)  Result Date: 11/13/2021  Lower Venous DVT Study Patient Name:  Mitchell HeirDANA Umble  Date of Exam:   11/13/2021 Medical Rec #: [id number]    Accession #:    [id number] Date of Birth: 1996-06-05    Patient Gender: M Patient Age:   [age] years [facility] Procedure:      VAS US LOWER EXTREMITY VENOUS (DVT) Referring Phys: Trixie DeisKELLY Jacob Chamblee --------------------------------------------------------------------------------  Indications: FUO.  Limitations: Poor ultrasound/tissue interface and patient positioning, patient immobility. Comparison Study: 10/24/2021 - Negative for DVT bilaterally. Performing Technologist: Chanda BusingGregory Collins RVT  Examination Guidelines: A complete evaluation  includes B-mode imaging, spectral Doppler, color Doppler, and power Doppler as needed of all accessible portions of each vessel. Bilateral testing is considered an integral part of a complete examination. Limited examinations for reoccurring indications may be performed as noted. The reflux portion of the exam is performed with the patient in reverse Trendelenburg.   +---------+---------------+---------+-----------+----------+--------------+ RIGHT    CompressibilityPhasicitySpontaneityPropertiesThrombus Aging +---------+---------------+---------+-----------+----------+--------------+ CFV      Full           Yes      Yes                                 +---------+---------------+---------+-----------+----------+--------------+ SFJ      Full                                                        +---------+---------------+---------+-----------+----------+--------------+ FV Prox  Full                                                        +---------+---------------+---------+-----------+----------+--------------+ FV Mid   Full                                                        +---------+---------------+---------+-----------+----------+--------------+ FV DistalFull           Yes      Yes                                 +---------+---------------+---------+-----------+----------+--------------+ PFV      Full                                                        +---------+---------------+---------+-----------+----------+--------------+ POP      Full           Yes      Yes                                 +---------+---------------+---------+-----------+----------+--------------+ PTV      Full                                                        +---------+---------------+---------+-----------+----------+--------------+ PERO     Full                                                        +---------+---------------+---------+-----------+----------+--------------+   +---------+---------------+---------+-----------+----------+--------------+  LEFT     CompressibilityPhasicitySpontaneityPropertiesThrombus Aging +---------+---------------+---------+-----------+----------+--------------+ CFV      Full           Yes      Yes                                  +---------+---------------+---------+-----------+----------+--------------+ SFJ      Full                                                        +---------+---------------+---------+-----------+----------+--------------+ FV Prox  Full                                                        +---------+---------------+---------+-----------+----------+--------------+ FV Mid   Full                                                        +---------+---------------+---------+-----------+----------+--------------+ FV DistalFull                                                        +---------+---------------+---------+-----------+----------+--------------+ PFV      Full                                                        +---------+---------------+---------+-----------+----------+--------------+ POP      Full           Yes      Yes                                 +---------+---------------+---------+-----------+----------+--------------+ PTV      Full                                                        +---------+---------------+---------+-----------+----------+--------------+ PERO     Full                                                        +---------+---------------+---------+-----------+----------+--------------+     Summary: RIGHT: - There is no evidence of deep vein thrombosis in the lower extremity. However, portions of this examination were limited- see technologist comments above.  - No cystic structure found in the popliteal fossa.  LEFT: - There is no evidence  of deep vein thrombosis in the lower extremity. However, portions of this examination were limited- see technologist comments above.  - No cystic structure found in the popliteal fossa.  *See table(s) above for measurements and observations. Electronically signed by Heath Lark on 11/13/2021 at 9:18:51 PM.    Final     Anti-infectives: Anti-infectives (From admission, onward)    Start      Dose/Rate Route Frequency Ordered Stop   11/11/21 1715  ceFEPIme (MAXIPIME) 1 g in sodium chloride 0.9 % 100 mL IVPB  Status:  Discontinued        1 g 200 mL/hr over 30 Minutes Intravenous Every 8 hours 11/11/21 1619 11/11/21 1619   11/11/21 1715  ceFEPIme (MAXIPIME) 1 g in sodium chloride 0.9 % 100 mL IVPB        1 g 200 mL/hr over 30 Minutes Intravenous Every 8 hours 11/11/21 1619 11/16/21 1359   10/31/21 1200  ceFEPIme (MAXIPIME) 2 g in sodium chloride 0.9 % 100 mL IVPB        2 g 200 mL/hr over 30 Minutes Intravenous Every 8 hours 10/31/21 1034 10/31/21 2015   10/26/21 2200  vancomycin (VANCOREADY) IVPB 1250 mg/250 mL  Status:  Discontinued        1,250 mg 166.7 mL/hr over 90 Minutes Intravenous Every 8 hours 10/26/21 1333 10/28/21 1314   10/26/21 1430  vancomycin (VANCOREADY) IVPB 2000 mg/400 mL        2,000 mg 200 mL/hr over 120 Minutes Intravenous  Once 10/26/21 1333 10/26/21 1859   10/24/21 1915  ceFEPIme (MAXIPIME) 2 g in sodium chloride 0.9 % 100 mL IVPB  Status:  Discontinued        2 g 200 mL/hr over 30 Minutes Intravenous Every 8 hours 10/24/21 1822 10/31/21 1034   10/16/21 1400  ceFAZolin (ANCEF) IVPB 2g/100 mL premix       Note to Pharmacy: Duration 7d including cefepime   2 g 200 mL/hr over 30 Minutes Intravenous Every 8 hours 10/16/21 1054 10/17/21 2219   10/13/21 2000  ceFAZolin (ANCEF) IVPB 2g/100 mL premix  Status:  Discontinued        2 g 200 mL/hr over 30 Minutes Intravenous Every 8 hours 10/13/21 1451 10/16/21 1054   10/11/21 1245  ceFEPIme (MAXIPIME) 2 g in sodium chloride 0.9 % 100 mL IVPB  Status:  Discontinued        2 g 200 mL/hr over 30 Minutes Intravenous Every 8 hours 10/11/21 1152 10/13/21 1451   10/09/21 1015  valACYclovir (VALTREX) tablet 1,000 mg  Status:  Discontinued        1,000 mg Per Tube 3 times daily 10/09/21 0916 10/15/21 1029   09/30/21 1000  ceFEPIme (MAXIPIME) 2 g in sodium chloride 0.9 % 100 mL IVPB  Status:  Discontinued        2 g 200  mL/hr over 30 Minutes Intravenous Every 8 hours 09/30/21 0843 10/02/21 1344   09/26/21 1400  ceFAZolin (ANCEF) IVPB 2g/100 mL premix  Status:  Discontinued        2 g 200 mL/hr over 30 Minutes Intravenous Every 8 hours 09/26/21 1050 09/29/21 0835   09/26/21 0015  ceFAZolin (ANCEF) IVPB 2g/100 mL premix        2 g 200 mL/hr over 30 Minutes Intravenous  Once 09/26/21 0004 09/26/21 0107        Assessment/Plan [name]   Large R SDH with midline shift, small L SDH, scattered SAH, bifrontal hemorrhagic contusion  -  worsened on repeat CT head initially, NSGY c/s, Dr. Wynetta Emery, to OR for emergent R crani 3/30 AM, drain out 4/1 per NSGY, keppra x7d for sz ppx. Grave prognosis per NSGY. Keppra restarted 4/9 for new sz activity o/n.  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 Conchita Paris, recs for no intervention R PCA stroke - ASA 325 as above CV - lopressor started 5/10 with improvement in HR and BP, now more tachy but suspect this is secondary to fever    FEN - NPO, continue tube feeds. Bowel regimen, S/P PEG 4/17 DVT - SCDs, LMWH. LE duplex 4/27 neg. Repeat duplex today  ID - Resp cx 4/13 with MSSA, completed 7 day course of abx. Resp cx 4/27 with pansensitive E coli. vanc 4/29>4/30, Cefepime 4/27>5/4. Fever curve improving, CXR and UA negative, blood and urine cxs negative, cefepime started 5/15>> for 5 days. LE duplexes negative. Ordered US duplexes today  Foley - removed 4/15. replaced 4/23 for urinary retention. On urecholine. TOV 5/1 - initially was voiding but stopped and required I&O mult times last 24h. Replaced foley 5/10, repeat UA and UCx negative.    Dispo - SNF placement, BUE [place] today for  fever workup  LOS: 50 days      Juliet Rude, [facility] 11/14/2021, 9:23 AM Please see Amion for pager number during day hours 7:00am-4:30pm

## 2021-11-14 NOTE — TOC Progression Note (Signed)
Transition of Care [facility]) - Progression Note    Patient Details  Name: Sean Bruce MRN: [id number] Date of Birth: 1995-08-28  [facility]) CM/SW Contact  Glennon Mac, RN Phone Number: 11/14/2021, 5:16 PM  Clinical Narrative:    Faxed SNF referral to [facility] in admissions at [facility], per [facility] Supervisor recommendation.     Expected Discharge Plan: Skilled Nursing Facility Barriers to Discharge: Continued Medical Work up  Expected Discharge Plan and Services Expected Discharge Plan: Skilled Nursing Facility   Discharge Planning Services: CM Consult   Living arrangements for the past 2 months: Single Family Home                                       Social Determinants of Health (SDOH) Interventions    Readmission Risk Interventions     View : No data to display.         Quintella Baton, RN, BSN  Trauma/Neuro ICU Case Manager [phone]

## 2021-11-15 LAB — GLUCOSE, CAPILLARY
Glucose-Capillary: 110 mg/dL — ABNORMAL HIGH (ref 70–99)
Glucose-Capillary: 117 mg/dL — ABNORMAL HIGH (ref 70–99)
Glucose-Capillary: 112 mg/dL — ABNORMAL HIGH (ref 70–99)
Glucose-Capillary: 134 mg/dL — ABNORMAL HIGH (ref 70–99)
Glucose-Capillary: 150 mg/dL — ABNORMAL HIGH (ref 70–99)
Glucose-Capillary: 151 mg/dL — ABNORMAL HIGH (ref 70–99)

## 2021-11-15 LAB — BASIC METABOLIC PANEL
Anion gap: 7 (ref 5–15)
BUN: 22 mg/dL — ABNORMAL HIGH (ref 6–20)
CO2: 24 mmol/L (ref 22–32)
Calcium: 9.1 mg/dL (ref 8.9–10.3)
Chloride: 115 mmol/L — ABNORMAL HIGH (ref 98–111)
Creatinine, Ser: 0.54 mg/dL — ABNORMAL LOW (ref 0.61–1.24)
GFR, Estimated: >60 mL/min (ref >60)
Glucose, Bld: 144 mg/dL — ABNORMAL HIGH (ref 70–99)
Potassium: 3.7 mmol/L (ref 3.5–5.1)
Sodium: 146 mmol/L — ABNORMAL HIGH (ref 135–145)

## 2021-11-15 NOTE — TOC Progression Note (Addendum)
Transition of Care [facility]) - Progression Note    Patient Details  Name: Sean Bruce MRN: [id number] Date of Birth: 24-Jan-1996  Transition of Care [facility]) CM/SW Contact  Glennon Mac, RN Phone Number: 11/15/2021, 12:10 PM  Clinical Narrative:    Received call from [facility] in admissions at [facility]: she states that facility cannot offer a bed for patient.  TOC will continue to follow for SNF bed search.   Left additional message for [facility], requesting callback.   Expected Discharge Plan: Skilled Nursing Facility Barriers to Discharge: Continued Medical Work up  Expected Discharge Plan and Services Expected Discharge Plan: Skilled Nursing Facility   Discharge Planning Services: CM Consult   Living arrangements for the past 2 months: Single Family Home                                       Social Determinants of Health (SDOH) Interventions    Readmission Risk Interventions     View : No data to display.         Quintella Baton, RN, BSN  Trauma/Neuro ICU Case Manager [phone]

## 2021-11-15 NOTE — Progress Notes (Signed)
Orthopedic Tech Progress Note Patient Details:  Sean Bruce 07-03-1995 [id number] Called in Helmet Per Trauma MD/RN Patient ID: Sean Bruce, male   DOB: [birth year], 26 y.o.   MRN: [id number]  Sean Bruce 11/15/2021, 8:54 AM
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
## 2021-11-16 LAB — GLUCOSE, CAPILLARY
Glucose-Capillary: 151 mg/dL — ABNORMAL HIGH (ref 70–99)
Glucose-Capillary: 149 mg/dL — ABNORMAL HIGH (ref 70–99)
Glucose-Capillary: 156 mg/dL — ABNORMAL HIGH (ref 70–99)
Glucose-Capillary: 111 mg/dL — ABNORMAL HIGH (ref 70–99)
Glucose-Capillary: 142 mg/dL — ABNORMAL HIGH (ref 70–99)
Glucose-Capillary: 129 mg/dL — ABNORMAL HIGH (ref 70–99)
Glucose-Capillary: 145 mg/dL — ABNORMAL HIGH (ref 70–99)

## 2021-11-16 LAB — CULTURE, BLOOD (ROUTINE X 2)
Culture: NO GROWTH
Culture: NO GROWTH
Special Requests: ADEQUATE
Special Requests: ADEQUATE
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
## 2021-11-17 LAB — GLUCOSE, CAPILLARY
Glucose-Capillary: 141 mg/dL — ABNORMAL HIGH (ref 70–99)
Glucose-Capillary: 170 mg/dL — ABNORMAL HIGH (ref 70–99)
Glucose-Capillary: 124 mg/dL — ABNORMAL HIGH (ref 70–99)
Glucose-Capillary: 151 mg/dL — ABNORMAL HIGH (ref 70–99)
Glucose-Capillary: 108 mg/dL — ABNORMAL HIGH (ref 70–99)
Glucose-Capillary: 117 mg/dL — ABNORMAL HIGH (ref 70–99)
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
## 2021-11-18 LAB — GLUCOSE, CAPILLARY
Glucose-Capillary: 148 mg/dL — ABNORMAL HIGH (ref 70–99)
Glucose-Capillary: 134 mg/dL — ABNORMAL HIGH (ref 70–99)
Glucose-Capillary: 156 mg/dL — ABNORMAL HIGH (ref 70–99)
Glucose-Capillary: 142 mg/dL — ABNORMAL HIGH (ref 70–99)
Glucose-Capillary: 142 mg/dL — ABNORMAL HIGH (ref 70–99)
Glucose-Capillary: 134 mg/dL — ABNORMAL HIGH (ref 70–99)
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
## 2021-11-18 NOTE — Progress Notes (Addendum)
Patient added to DTP list for discharge planning.  CSW sent patient's clinicals out to additional facilities for review.  CSW completed PASSR screening - #[id number].  CSW spoke with [facility] who states she has not identified an accepting facility at this time.  Edwin Dada, MSW, LCSW Transitions of Care  Clinical Social Worker II [phone]

## 2021-11-19 LAB — GLUCOSE, CAPILLARY
Glucose-Capillary: 144 mg/dL — ABNORMAL HIGH (ref 70–99)
Glucose-Capillary: 108 mg/dL — ABNORMAL HIGH (ref 70–99)
Glucose-Capillary: 126 mg/dL — ABNORMAL HIGH (ref 70–99)
Glucose-Capillary: 124 mg/dL — ABNORMAL HIGH (ref 70–99)
Glucose-Capillary: 140 mg/dL — ABNORMAL HIGH (ref 70–99)
Glucose-Capillary: 131 mg/dL — ABNORMAL HIGH (ref 70–99)

## 2021-11-19 LAB — BASIC METABOLIC PANEL
Anion gap: 5 (ref 5–15)
BUN: 20 mg/dL (ref 6–20)
CO2: 29 mmol/L (ref 22–32)
Calcium: 9.3 mg/dL (ref 8.9–10.3)
Chloride: 111 mmol/L (ref 98–111)
Creatinine, Ser: 0.49 mg/dL — ABNORMAL LOW (ref 0.61–1.24)
GFR, Estimated: >60 mL/min (ref >60)
Glucose, Bld: 141 mg/dL — ABNORMAL HIGH (ref 70–99)
Potassium: 3.6 mmol/L (ref 3.5–5.1)
Sodium: 145 mmol/L (ref 135–145)

## 2021-11-19 LAB — CBC
HCT: 42.2 % (ref 39.0–52.0)
Hemoglobin: 12.7 g/dL — ABNORMAL LOW (ref 13.0–17.0)
MCH: 30 pg (ref 26.0–34.0)
MCHC: 30.1 g/dL (ref 30.0–36.0)
MCV: 99.8 fL (ref 80.0–100.0)
Platelets: 190 10*3/uL (ref 150–400)
RBC: 4.23 MIL/uL (ref 4.22–5.81)
RDW: 14.6 % (ref 11.5–15.5)
WBC: 11 10*3/uL — ABNORMAL HIGH (ref 4.0–10.5)
nRBC: 0 % (ref 0.0–0.2)

## 2021-11-19 MED ORDER — AMANTADINE HCL 100 MG PO CAPS
100.0000 mg | ORAL_CAPSULE | Freq: Two times a day (BID) | ORAL | Status: DC
  Administered 2021-11-19 – 2021-11-25 (×12): 100 mg
  Filled 2021-11-19 (×13): qty 1
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
## 2021-11-19 NOTE — TOC Progression Note (Addendum)
Transition of Care [facility]) - Progression Note    Patient Details  Name: Sean Bruce MRN: [id number] Date of Birth: Jul 19, 1995  Transition of Care [facility]) CM/SW [place], RN Phone Number: 11/19/2021, 10:02 AM  Clinical Narrative:    DTP Team will continue to follow the patient for [facility] needs - SNF placement / LOG to be provided since patient has no insurance provider.  I called and left a voicemail message with Sean Bruce, Admissions Director with [facility] in [place], [place], [place].  Clinicals were send securely to email address at the facility - [email or web address].  1015 - I called and spoke with Sean Bruce, Admission director with [facility] and she is willing to review the patient's clinicals and screen for possible bed offer with LOG needed.  1130 - CM called and left a message with Sean Bruce to include that the patient has a pending Medicaid # [id number] and DSS case worker is Sean Bruce as this time.  11/19/2021 - CM met with the patient and bedside nursing this morning and the patient's aunt was not present at this time.  Sean Flattery, RN primary was a bedside and states that the patient's Specialty Helmet has arrived but orders were needed from Trauma Team / Neurosurgery MD so that bedside nursing could begin to use at the bedside.  I sent a message to Sasser, PT and Trauma attending, Sean Bruce for orders to assist since Dr. Saintclair Halsted, Neurosurgery MD and NP was offline at this time.  Sasser, PT included recommendations in the comments that were included for the attending Trauma MD so that orders could be placed for patient's care.  Bedside nursing to follow up if additional orders are needed.  CM and MSW with DTP Team will continue to follow the patient for SNF placement - No bed offers available at this time.  11/19/2021 1204 - [facility] SN[facility] is reviewing clinicals at this time.  I also spoke with Sean Bruce, Admission Director at [facility] in  [place] and she was given updated information regarding the patient's pending Medicaid # and caseworker and she states that she has no available SNF beds at this time and would review the patient's clinicals to see if one of the other [facility] in the area could assist with placement.  11/19/2021 1404 - Called and spoke with the patient's mother, Sean Bruce on the phone and she is aware that DTP Team will continue to explore SNF options for the patient and Medicaid is pending at this time.  The patient's mother states that she plans to move to the area shortly and is aware that the patient may likely be placed outside the [place] area if local facilities are unavailable for  admission and she was agreeable and states that she has extended family in the [place], [place], and [place] areas.  The patient's mother was given my contact information if she had any questions regarding transitions of care needs.   Expected Discharge Plan: Skilled Nursing Facility Barriers to Discharge: Continued Medical Work up, SNF Pending payor source - LOG  Expected Discharge Plan and Services Expected Discharge Plan: Sean Claus In-house Referral: Clinical Social Work Discharge Planning Services: CM Consult   Living arrangements for the past 2 months: [place]  Social Determinants of Health (SDOH) Interventions    Readmission Risk Interventions     View : No data to display.
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
## 2021-11-19 NOTE — Plan of Care (Signed)
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
## 2021-11-19 NOTE — Progress Notes (Addendum)
11am: Patient denied admission to [facility] and [facility] due to his age.  9am: CSW spoke with Janie at [facility] who states she cannot accept any LOG's at this time.  CSW spoke with [place] at [facility] who states she will discuss patient and LOG request with facility administrator and DON and return call to CSW.  CSW spoke with Adella Nissen at [facility] who states the referral was given to company VP for review.  Edwin Dada, MSW, LCSW Transitions of Care  Clinical Social Worker II [phone]

## 2021-11-20 LAB — GLUCOSE, CAPILLARY
Glucose-Capillary: 125 mg/dL — ABNORMAL HIGH (ref 70–99)
Glucose-Capillary: 125 mg/dL — ABNORMAL HIGH (ref 70–99)
Glucose-Capillary: 126 mg/dL — ABNORMAL HIGH (ref 70–99)
Glucose-Capillary: 131 mg/dL — ABNORMAL HIGH (ref 70–99)
Glucose-Capillary: 143 mg/dL — ABNORMAL HIGH (ref 70–99)
Glucose-Capillary: 161 mg/dL — ABNORMAL HIGH (ref 70–99)

## 2021-11-20 MED ORDER — DOXAZOSIN MESYLATE 2 MG PO TABS
2.0000 mg | ORAL_TABLET | Freq: Every day | ORAL | Status: DC
  Administered 2021-11-20 – 2021-11-25 (×6): 2 mg
  Filled 2021-11-20 (×6): qty 1

## 2021-11-20 MED ORDER — OSMOLITE 1.5 CAL PO LIQD
474.0000 mL | Freq: Four times a day (QID) | ORAL | Status: DC
  Administered 2021-11-20: 237 mL
  Administered 2021-11-20: 474 mL
  Administered 2021-11-20: 355 mL
  Administered 2021-11-21 – 2021-11-25 (×18): 474 mL
  Filled 2021-11-20 (×23): qty 474

## 2021-11-20 MED ORDER — PROSOURCE TF PO LIQD
45.0000 mL | Freq: Every day | ORAL | Status: DC
  Administered 2021-11-21 – 2021-11-25 (×5): 45 mL
  Filled 2021-11-20 (×5): qty 45

## 2021-11-20 MED ORDER — FREE WATER
100.0000 mL | Freq: Four times a day (QID) | Status: DC
  Administered 2021-11-20 – 2021-11-25 (×21): 100 mL
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
## 2021-11-20 NOTE — TOC Progression Note (Addendum)
Transition of Care [facility]) - Progression Note    Patient Details  Name: Gay Rape MRN: [id number] Date of Birth: [birth year]  Transition of Care [facility]) CM/SW Contact  Janae Bridgeman, RN Phone Number: 11/20/2021, 7:57 AM  Clinical Narrative:    CM spoke with Verlin Dike, NP for Neurosurgery and order placed for nursing use of protective helmet for the patient.  Order placed with nursing instructions to be co-signed by Meyran, NP.  11/20/2021 1038 - I called and spoke with Sharyn Creamer, Admissions director at [facility] at [facility] in [place] and her facility did not have open admission bed availability at this time - but she forwarded the patient's clinicals to [place], CM at [facility] facility in [place], [place] to review for possible placement.  11/20/2021 1108 - I called and left a message with Juanita, CM with Admissions at [facility] in [place]  - my contact information was included for follow up while facility is reviewing the patient's clinicals.  CM and LCSW with DTP Team will continue to follow the patient for SNF placement needs.   Expected Discharge Plan: Skilled Nursing Facility Barriers to Discharge: Continued Medical Work up, SNF Pending payor source - LOG  Expected Discharge Plan and Services Expected Discharge Plan: Skilled Nursing Facility In-house Referral: Clinical Social Work Discharge Planning Services: CM Consult   Living arrangements for the past 2 months: Single Family Home                                       Social Determinants of Health (SDOH) Interventions    Readmission Risk Interventions     View : No data to display.

## 2021-11-20 NOTE — Progress Notes (Addendum)
CSW received notification from [place] at [facility] that patient cannot be accepted due to his age and cost of care.  CSW spoke with Olegario Messier of [employer] to request her assistance in locating a facility that could accommodate the patient.   Edwin Dada, MSW, LCSW Transitions of Care  Clinical Social Worker II [phone]

## 2021-11-20 NOTE — Progress Notes (Signed)
Received pt from IR. Peg tube intact with dsg CDI. Tube feeding restarted per Dht. Peg remains clamped for 24 hr. Pt remains drowsy.

## 2021-11-20 NOTE — Plan of Care (Signed)

## 2021-11-21 LAB — GLUCOSE, CAPILLARY
Glucose-Capillary: 87 mg/dL (ref 70–99)
Glucose-Capillary: 97 mg/dL (ref 70–99)
Glucose-Capillary: 182 mg/dL — ABNORMAL HIGH (ref 70–99)
Glucose-Capillary: 190 mg/dL — ABNORMAL HIGH (ref 70–99)
Glucose-Capillary: 142 mg/dL — ABNORMAL HIGH (ref 70–99)
Glucose-Capillary: 167 mg/dL — ABNORMAL HIGH (ref 70–99)

## 2021-11-21 NOTE — TOC Progression Note (Signed)
Transition of Care [facility]) - Progression Note    Patient Details  Name: Sean Bruce MRN: [id number] Date of Birth: 02-24-1996  Transition of Care [facility]) CM/SW Contact  Janae Bridgeman, RN Phone Number: 11/21/2021, 10:54 AM  Clinical Narrative:    CM called and spoke with Sherryl Barters, CM at [facility] in [place], [place] and the admission department will forward the patient's clinicals to the corporate office to review.  Updated clinicals sent by secure email to [email or web address]  [place], CM was given contact information to DSS caseworker, Selena Batten Little and updated her that LOG would be provided for placement with accepting bed offer.  CM and MSW with DTP Team will continue to follow the patient for [facility] placement.   Expected Discharge Plan: Skilled Nursing Facility Barriers to Discharge: Continued Medical Work up, SNF Pending payor source - LOG  Expected Discharge Plan and Services Expected Discharge Plan: Skilled Nursing Facility In-house Referral: Clinical Social Work Discharge Planning Services: CM Consult   Living arrangements for the past 2 months: Single Family Home                                       Social Determinants of Health (SDOH) Interventions    Readmission Risk Interventions     View : No data to display.

## 2021-11-21 NOTE — Plan of Care (Signed)

## 2021-11-21 NOTE — Progress Notes (Signed)
CSW spoke with [place] at [facility] who states the facility cannot offer the bed.  Edwin Dada, MSW, LCSW Transitions of Care  Clinical Social Worker II [phone]
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
## 2021-11-22 ENCOUNTER — Inpatient Hospital Stay (HOSPITAL_COMMUNITY): Payer: Medicaid Other

## 2021-11-22 LAB — BASIC METABOLIC PANEL
Anion gap: 9 (ref 5–15)
BUN: 15 mg/dL (ref 6–20)
CO2: 25 mmol/L (ref 22–32)
Calcium: 9.5 mg/dL (ref 8.9–10.3)
Chloride: 109 mmol/L (ref 98–111)
Creatinine, Ser: 0.54 mg/dL — ABNORMAL LOW (ref 0.61–1.24)
GFR, Estimated: >60 mL/min (ref >60)
Glucose, Bld: 163 mg/dL — ABNORMAL HIGH (ref 70–99)
Potassium: 4 mmol/L (ref 3.5–5.1)
Sodium: 143 mmol/L (ref 135–145)

## 2021-11-22 LAB — GLUCOSE, CAPILLARY
Glucose-Capillary: 114 mg/dL — ABNORMAL HIGH (ref 70–99)
Glucose-Capillary: 115 mg/dL — ABNORMAL HIGH (ref 70–99)
Glucose-Capillary: 146 mg/dL — ABNORMAL HIGH (ref 70–99)
Glucose-Capillary: 182 mg/dL — ABNORMAL HIGH (ref 70–99)
Glucose-Capillary: 150 mg/dL — ABNORMAL HIGH (ref 70–99)
Glucose-Capillary: 182 mg/dL — ABNORMAL HIGH (ref 70–99)

## 2021-11-22 IMAGING — DX DG CHEST 1V PORT
1 series · 1 of 1 positions shown · non-contrast
Comparison: [DATE].

CLINICAL DATA: Tachycardia in a 25-year-old male.

EXAM:
PORTABLE CHEST 1 VIEW

[chest]
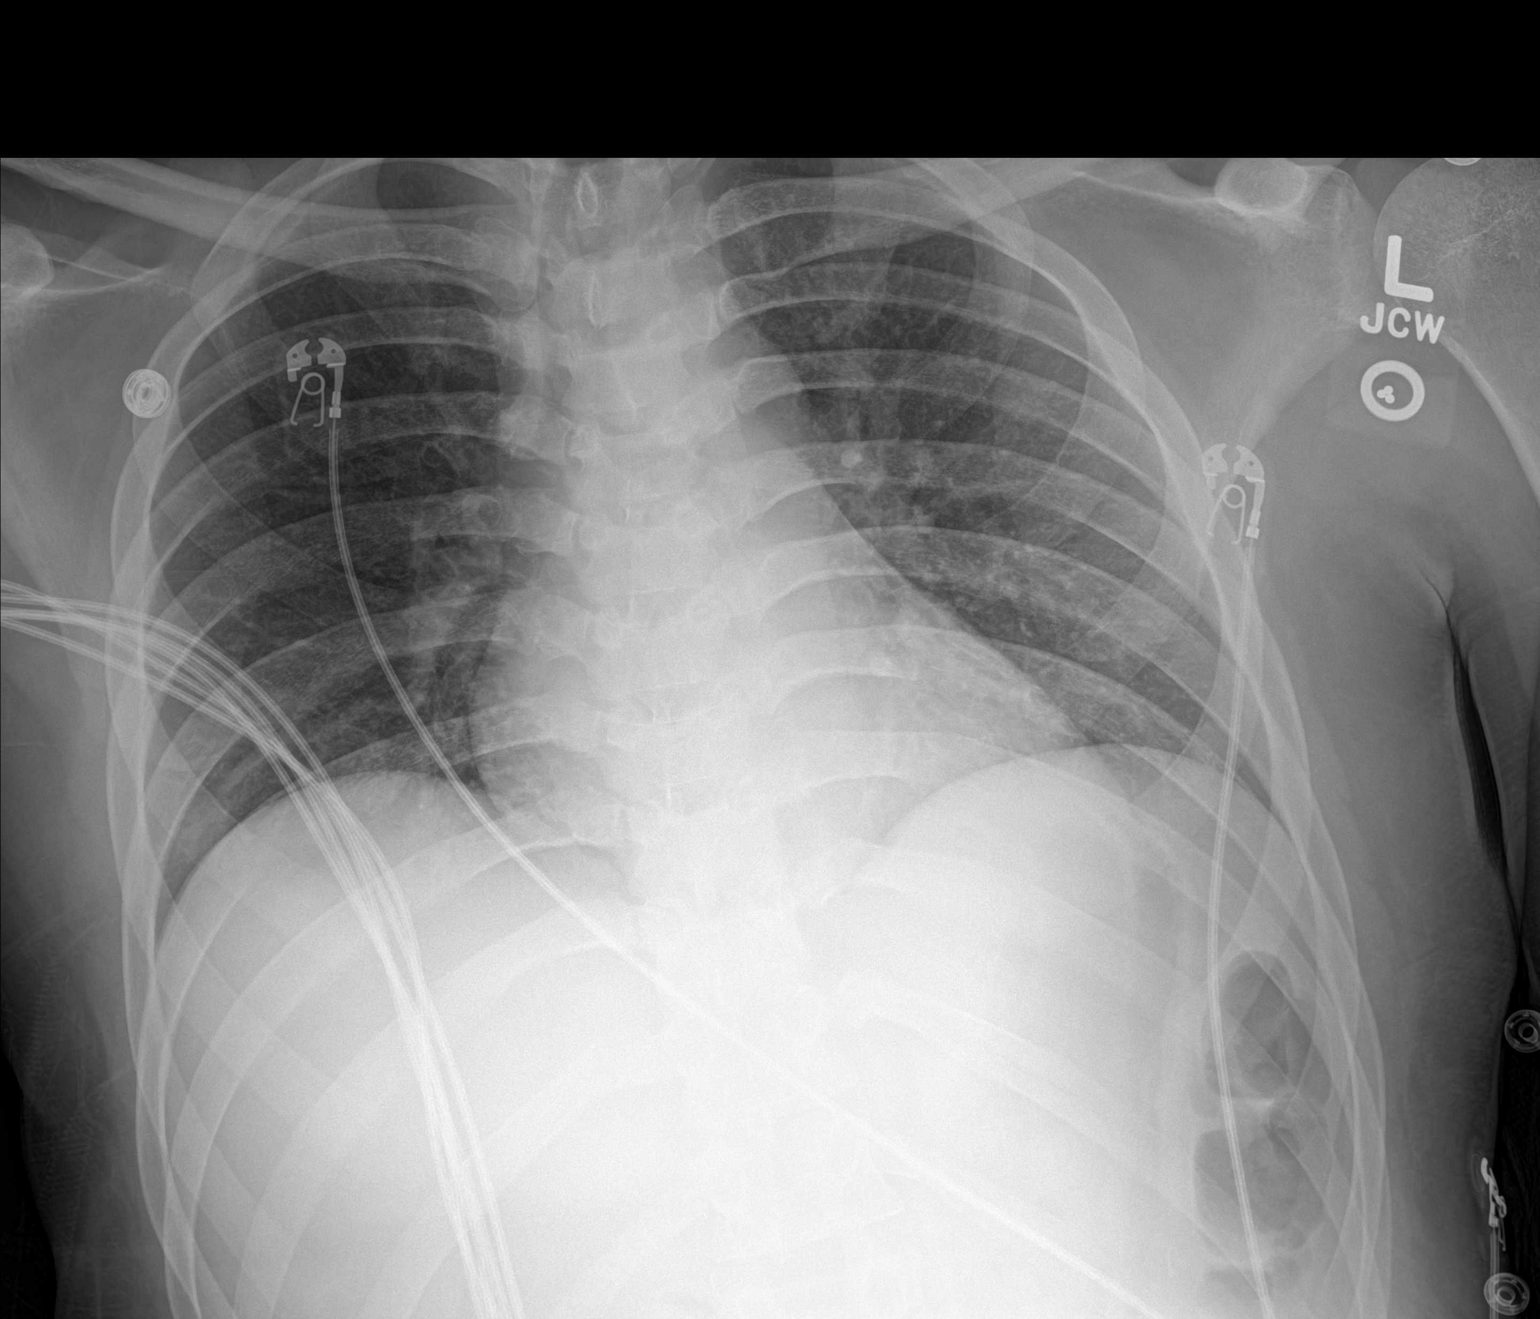

[1 of 1 positions shown; findings below may reference images not displayed]

FINDINGS: Heart size may be slightly increased compared to previous imaging
but is accentuated by portable technique and AP projection.

Mild increased interstitial markings throughout the chest. Subtle
opacities in the LEFT mid and lower chest. No visible pneumothorax.
No gross evidence of pleural effusion.

On limited assessment there is no acute skeletal finding.
IMPRESSION: Mild increased interstitial markings and some asymmetry in the LEFT
chest could reflect sequela of viral infection or mild asymmetric
edema. Attention on follow-up.

Query mild cardiac enlargement compared to previous imaging.

## 2021-11-22 MED ORDER — BETHANECHOL CHLORIDE 10 MG PO TABS
10.0000 mg | ORAL_TABLET | Freq: Three times a day (TID) | ORAL | Status: DC
  Administered 2021-11-22 – 2021-11-25 (×9): 10 mg
  Filled 2021-11-22 (×9): qty 1

## 2021-11-22 MED ORDER — METOPROLOL TARTRATE 25 MG/10 ML ORAL SUSPENSION
50.0000 mg | Freq: Once | ORAL | Status: AC
  Administered 2021-11-22: 50 mg
  Filled 2021-11-22: qty 20

## 2021-11-22 MED ORDER — METOPROLOL TARTRATE 25 MG/10 ML ORAL SUSPENSION
100.0000 mg | Freq: Two times a day (BID) | ORAL | Status: DC
  Administered 2021-11-22 – 2021-11-25 (×6): 100 mg
  Filled 2021-11-22 (×7): qty 40

## 2021-11-22 NOTE — Progress Notes (Signed)
RN bladder scanned pt. Due to no out put since start of shift. Bladder scan read 587 mL. Per the note from Hosie Spangle - PA, RN I/O pt. Post residual 80 mL
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
## 2021-11-22 NOTE — Progress Notes (Addendum)
CSW spoke with Eunice Blase at [facility] to confirm rates for LOG.  CSW completed LOG request and sent it to [facility] for review.  CSW arranged for transport via [facility] at 11am on Monday. An LOG request was sent for review also.  Edwin Dada, MSW, LCSW Transitions of Care  Clinical Social Worker II [phone]

## 2021-11-22 NOTE — TOC Progression Note (Addendum)
Transition of Care [facility]) - Progression Note    Patient Details  Name: Sean Bruce MRN: [id number] Date of Birth: August 14, 1995  Transition of [facility]) CM/SW Contact  Janae Bridgeman, RN Phone Number: 11/22/2021, 9:30 AM  Clinical Narrative:    CM called and spoke with Eunice Blase, CM with [facility] to explore options for placement at the [place] facility.  CM will have the Director of admissions review clinicals and will follow up.  [facility] SNf facility is [place], [place] is reviewing clinicals as well.  No firm bed offers for SNF placement at this time.  11/22/2021 1059 - CM called and spoke with Marta Lamas, Admissions CM with [facility] in [place], [place] and the facility is considering the patient for admission at this time - possible bed availability for Monday, 11/25/2021 - pending review of LOG contract.  Claudette Laws, LCSW is aware.  I called and updated the patient's mother, [name], my phone and made her aware of the pending offer to [facility]).  CM and MSW with DTP Team will continue to follow the patient for SNF placement.  11/22/2021 1157 - CM confirmed that the patient received a firm bed offer to [facility] with available bed on Monday, 11/25/21.  I called and spoke with the patient's mother [name], and she is aware of the bed offer and accepted.  I sent a message to [place], PA to request foley catheter be replaced since the facility is able to provide I&O cath care at the facility.  Charge RN is aware and will follow for orders from MD/PA for placement of foley.  Claudette Laws, LCSW is aware as well.  11/22/2021 1445 - CM spoke with Carlena Bjornstad, PA and orders will be placed to have foley catheter re-inserted on Sunday prior to patient's discharge to the facility on Monday.  [place], PA is aware of discharge to the facility and discharge orders and summary will be completed by Monday.  No COVID screen is needed.  Expected Discharge Plan: Skilled  Nursing Facility Barriers to Discharge: Continued Medical Work up, SNF Pending payor source - LOG  Expected Discharge Plan and Services Expected Discharge Plan: Skilled Nursing Facility In-house Referral: Clinical Social Work Discharge Planning Services: CM Consult   Living arrangements for the past 2 months: Single Family Home                                       Social Determinants of Health (SDOH) Interventions    Readmission Risk Interventions     View : No data to display.
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
## 2021-11-22 NOTE — Progress Notes (Signed)
Patient was having high blood pressure, HR and temperature this morning. New orders were placed by the PA and we will continue to monitor vital signs and MEWS scores.

## 2021-11-22 NOTE — Progress Notes (Signed)
Per AD request we will continue in and out's until Sunday and then we will place foley so he can go to SNF on Monday.

## 2021-11-23 LAB — GLUCOSE, CAPILLARY
Glucose-Capillary: 145 mg/dL — ABNORMAL HIGH (ref 70–99)
Glucose-Capillary: 102 mg/dL — ABNORMAL HIGH (ref 70–99)
Glucose-Capillary: 139 mg/dL — ABNORMAL HIGH (ref 70–99)
Glucose-Capillary: 108 mg/dL — ABNORMAL HIGH (ref 70–99)
Glucose-Capillary: 138 mg/dL — ABNORMAL HIGH (ref 70–99)
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
## 2021-11-24 LAB — GLUCOSE, CAPILLARY
Glucose-Capillary: 106 mg/dL — ABNORMAL HIGH (ref 70–99)
Glucose-Capillary: 113 mg/dL — ABNORMAL HIGH (ref 70–99)
Glucose-Capillary: 122 mg/dL — ABNORMAL HIGH (ref 70–99)
Glucose-Capillary: 129 mg/dL — ABNORMAL HIGH (ref 70–99)
Glucose-Capillary: 133 mg/dL — ABNORMAL HIGH (ref 70–99)
Glucose-Capillary: 153 mg/dL — ABNORMAL HIGH (ref 70–99)
Glucose-Capillary: 96 mg/dL (ref 70–99)
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
## 2021-11-25 LAB — GLUCOSE, CAPILLARY
Glucose-Capillary: 129 mg/dL — ABNORMAL HIGH (ref 70–99)
Glucose-Capillary: 112 mg/dL — ABNORMAL HIGH (ref 70–99)
Glucose-Capillary: 140 mg/dL — ABNORMAL HIGH (ref 70–99)

## 2021-11-25 MED ORDER — BETHANECHOL CHLORIDE 10 MG PO TABS: 10.0000 mg | ORAL_TABLET | Freq: Three times a day (TID) | ORAL | Status: DC

## 2021-11-25 MED ORDER — FREE WATER: 100.0000 mL | Freq: Four times a day (QID) | Status: DC

## 2021-11-25 MED ORDER — MUPIROCIN 2 % EX OINT: TOPICAL_OINTMENT | Freq: Two times a day (BID) | CUTANEOUS | 0 refills | Status: DC

## 2021-11-25 MED ORDER — ONDANSETRON HCL 4 MG PO TABS: 4.0000 mg | ORAL_TABLET | Freq: Four times a day (QID) | ORAL | 0 refills | Status: DC | PRN

## 2021-11-25 MED ORDER — DOCUSATE SODIUM 50 MG/5ML PO LIQD: 100.0000 mg | Freq: Two times a day (BID) | ORAL | 0 refills | Status: DC

## 2021-11-25 MED ORDER — LEVETIRACETAM 100 MG/ML PO SOLN
500.0000 mg | Freq: Two times a day (BID) | ORAL | 12 refills | Status: DC
Start: 2021-11-25 — End: 2022-05-20

## 2021-11-25 MED ORDER — OSMOLITE 1.5 CAL PO LIQD: 474.0000 mL | Freq: Four times a day (QID) | ORAL | 0 refills | Status: DC

## 2021-11-25 MED ORDER — PROSOURCE TF PO LIQD
45.0000 mL | Freq: Every day | ORAL | Status: DC
Start: 2021-11-25 — End: 2021-12-05

## 2021-11-25 MED ORDER — ACETAMINOPHEN 500 MG PO TABS: 1000.0000 mg | ORAL_TABLET | Freq: Four times a day (QID) | ORAL | 0 refills | Status: DC | PRN

## 2021-11-25 MED ORDER — CHLORHEXIDINE GLUCONATE 0.12 % MT SOLN
15.0000 mL | Freq: Two times a day (BID) | OROMUCOSAL | 0 refills | Status: DC
Start: 2021-11-25 — End: 2023-02-17

## 2021-11-25 MED ORDER — INSULIN ASPART 100 UNIT/ML IJ SOLN: 0.0000 [IU] | INTRAMUSCULAR | 11 refills | Status: DC

## 2021-11-25 MED ORDER — ENOXAPARIN SODIUM 40 MG/0.4ML IJ SOSY: 30.0000 mg | PREFILLED_SYRINGE | Freq: Two times a day (BID) | INTRAMUSCULAR | Status: DC

## 2021-11-25 MED ORDER — POLYETHYLENE GLYCOL 3350 17 G PO PACK: 17.0000 g | PACK | Freq: Every day | ORAL | 0 refills | Status: DC

## 2021-11-25 MED ORDER — AMANTADINE HCL 100 MG PO CAPS
100.0000 mg | ORAL_CAPSULE | Freq: Two times a day (BID) | ORAL | Status: DC
Start: 2021-11-25 — End: 2023-02-17

## 2021-11-25 MED ORDER — ASPIRIN 325 MG PO TABS: 325.0000 mg | ORAL_TABLET | Freq: Every day | ORAL | Status: DC

## 2021-11-25 MED ORDER — INSULIN GLARGINE-YFGN 100 UNIT/ML ~~LOC~~ SOLN: 15.0000 [IU] | Freq: Every day | SUBCUTANEOUS | 11 refills | Status: DC

## 2021-11-25 MED ORDER — METOPROLOL TARTRATE 25 MG/10 ML ORAL SUSPENSION: 100.0000 mg | Freq: Two times a day (BID) | ORAL | Status: DC

## 2021-11-25 MED ORDER — ORAL CARE MOUTH RINSE
15.0000 mL | Freq: Two times a day (BID) | OROMUCOSAL | 0 refills | Status: DC
Start: 2021-11-25 — End: 2021-12-05

## 2021-11-25 MED ORDER — BISACODYL 10 MG RE SUPP: 10.0000 mg | Freq: Every day | RECTAL | 0 refills | Status: DC

## 2021-11-25 NOTE — Progress Notes (Addendum)
Patient will go to [facility] today via [facility] at 1pm. The number to call for report is [phone].   Discharge packet completed and delivered to unit.  RN aware of discharge plan.  Edwin Dada, MSW, LCSW Transitions of Care  Clinical Social Worker II [phone]

## 2021-11-25 NOTE — Progress Notes (Signed)
This RN attempted to call report to [facility] no answer at this time.

## 2021-11-25 NOTE — Progress Notes (Signed)
Report to nurse at [facility] all questions answered. Discharge packet printed and placed on chart for [facility]. Awaiting [facility] for transport. Family at bedside informed of [facility] ETA.

## 2021-11-25 NOTE — Progress Notes (Signed)
[facility] on unit to transport pt to [facility]. Family at bedside and took all pt belongings. Discharge packet given to [facility].

## 2021-12-01 ENCOUNTER — Emergency Department (HOSPITAL_COMMUNITY): Payer: Medicaid Other

## 2021-12-01 ENCOUNTER — Other Ambulatory Visit: Payer: Self-pay

## 2021-12-01 ENCOUNTER — Encounter (HOSPITAL_COMMUNITY): Payer: Self-pay | Admitting: Emergency Medicine

## 2021-12-01 ENCOUNTER — Inpatient Hospital Stay (HOSPITAL_COMMUNITY)
Admission: EM | Admit: 2021-12-01 | Discharge: 2021-12-05 | DRG: 698 | Disposition: A | Discharge status: Skilled Nursing Facility | Payer: Medicaid Other | Attending: Family Medicine | Admitting: Family Medicine

## 2021-12-01 DIAGNOSIS — S069X9S Unspecified intracranial injury with loss of consciousness of unspecified duration, sequela: Secondary | ICD-10-CM

## 2021-12-01 DIAGNOSIS — Z87820 Personal history of traumatic brain injury: Secondary | ICD-10-CM

## 2021-12-01 DIAGNOSIS — N179 Acute kidney failure, unspecified: Secondary | ICD-10-CM | POA: Diagnosis present

## 2021-12-01 DIAGNOSIS — S069XAA Unspecified intracranial injury with loss of consciousness status unknown, initial encounter: Secondary | ICD-10-CM | POA: Diagnosis present

## 2021-12-01 DIAGNOSIS — A4181 Sepsis due to Enterococcus: Secondary | ICD-10-CM | POA: Diagnosis present

## 2021-12-01 DIAGNOSIS — Z7982 Long term (current) use of aspirin: Secondary | ICD-10-CM

## 2021-12-01 DIAGNOSIS — Z79899 Other long term (current) drug therapy: Secondary | ICD-10-CM

## 2021-12-01 DIAGNOSIS — D6959 Other secondary thrombocytopenia: Secondary | ICD-10-CM | POA: Diagnosis present

## 2021-12-01 DIAGNOSIS — Y846 Urinary catheterization as the cause of abnormal reaction of the patient, or of later complication, without mention of misadventure at the time of the procedure: Secondary | ICD-10-CM | POA: Diagnosis present

## 2021-12-01 DIAGNOSIS — Z8673 Personal history of transient ischemic attack (TIA), and cerebral infarction without residual deficits: Secondary | ICD-10-CM

## 2021-12-01 DIAGNOSIS — E876 Hypokalemia: Secondary | ICD-10-CM | POA: Diagnosis present

## 2021-12-01 DIAGNOSIS — J690 Pneumonitis due to inhalation of food and vomit: Secondary | ICD-10-CM | POA: Diagnosis present

## 2021-12-01 DIAGNOSIS — N390 Urinary tract infection, site not specified: Secondary | ICD-10-CM | POA: Diagnosis present

## 2021-12-01 DIAGNOSIS — A4152 Sepsis due to Pseudomonas: Secondary | ICD-10-CM | POA: Diagnosis present

## 2021-12-01 DIAGNOSIS — E870 Hyperosmolality and hypernatremia: Secondary | ICD-10-CM | POA: Diagnosis present

## 2021-12-01 DIAGNOSIS — Z7401 Bed confinement status: Secondary | ICD-10-CM

## 2021-12-01 DIAGNOSIS — Z930 Tracheostomy status: Secondary | ICD-10-CM

## 2021-12-01 DIAGNOSIS — R7989 Other specified abnormal findings of blood chemistry: Secondary | ICD-10-CM

## 2021-12-01 DIAGNOSIS — R509 Fever, unspecified: Principal | ICD-10-CM

## 2021-12-01 DIAGNOSIS — Z1152 Encounter for screening for COVID-19: Secondary | ICD-10-CM

## 2021-12-01 DIAGNOSIS — R532 Functional quadriplegia: Secondary | ICD-10-CM | POA: Diagnosis present

## 2021-12-01 DIAGNOSIS — R000 Tachycardia, unspecified: Secondary | ICD-10-CM

## 2021-12-01 DIAGNOSIS — A419 Sepsis, unspecified organism: Secondary | ICD-10-CM

## 2021-12-01 DIAGNOSIS — R739 Hyperglycemia, unspecified: Secondary | ICD-10-CM | POA: Diagnosis present

## 2021-12-01 DIAGNOSIS — Z931 Gastrostomy status: Secondary | ICD-10-CM

## 2021-12-01 DIAGNOSIS — D649 Anemia, unspecified: Secondary | ICD-10-CM | POA: Diagnosis present

## 2021-12-01 DIAGNOSIS — Z794 Long term (current) use of insulin: Secondary | ICD-10-CM

## 2021-12-01 DIAGNOSIS — E861 Hypovolemia: Secondary | ICD-10-CM | POA: Diagnosis present

## 2021-12-01 DIAGNOSIS — L89312 Pressure ulcer of right buttock, stage 2: Secondary | ICD-10-CM | POA: Diagnosis present

## 2021-12-01 DIAGNOSIS — R6520 Severe sepsis without septic shock: Secondary | ICD-10-CM | POA: Diagnosis present

## 2021-12-01 DIAGNOSIS — L8990 Pressure ulcer of unspecified site, unspecified stage: Secondary | ICD-10-CM | POA: Insufficient documentation

## 2021-12-01 DIAGNOSIS — T83511A Infection and inflammatory reaction due to indwelling urethral catheter, initial encounter: Principal | ICD-10-CM | POA: Diagnosis present

## 2021-12-01 DIAGNOSIS — J189 Pneumonia, unspecified organism: Secondary | ICD-10-CM | POA: Diagnosis present

## 2021-12-01 LAB — CBC WITH DIFFERENTIAL/PLATELET
Abs Immature Granulocytes: 0.28 10*3/uL — ABNORMAL HIGH (ref 0.00–0.07)
Basophils Absolute: 0.1 10*3/uL (ref 0.0–0.1)
Basophils Relative: 1 %
Eosinophils Absolute: 0 10*3/uL (ref 0.0–0.5)
Eosinophils Relative: 0 %
HCT: 52.5 % — ABNORMAL HIGH (ref 39.0–52.0)
Hemoglobin: 16.1 g/dL (ref 13.0–17.0)
Immature Granulocytes: 2 %
Lymphocytes Relative: 10 %
Lymphs Abs: 1.9 10*3/uL (ref 0.7–4.0)
MCH: 29.8 pg (ref 26.0–34.0)
MCHC: 30.7 g/dL (ref 30.0–36.0)
MCV: 97 fL (ref 80.0–100.0)
Monocytes Absolute: 1.7 10*3/uL — ABNORMAL HIGH (ref 0.1–1.0)
Monocytes Relative: 9 %
Neutro Abs: 14.5 10*3/uL — ABNORMAL HIGH (ref 1.7–7.7)
Neutrophils Relative %: 78 %
Platelets: 247 10*3/uL (ref 150–400)
RBC: 5.41 MIL/uL (ref 4.22–5.81)
RDW: 14.6 % (ref 11.5–15.5)
WBC: 18.5 10*3/uL — ABNORMAL HIGH (ref 4.0–10.5)
nRBC: 0.4 % — ABNORMAL HIGH (ref 0.0–0.2)

## 2021-12-01 LAB — COMPREHENSIVE METABOLIC PANEL
ALT: 326 U/L — ABNORMAL HIGH (ref 0–44)
AST: 174 U/L — ABNORMAL HIGH (ref 15–41)
Albumin: 3.5 g/dL (ref 3.5–5.0)
Alkaline Phosphatase: 90 U/L (ref 38–126)
Anion gap: 5 (ref 5–15)
BUN: 23 mg/dL — ABNORMAL HIGH (ref 6–20)
CO2: 25 mmol/L (ref 22–32)
Calcium: 9 mg/dL (ref 8.9–10.3)
Chloride: 118 mmol/L — ABNORMAL HIGH (ref 98–111)
Creatinine, Ser: 0.81 mg/dL (ref 0.61–1.24)
GFR, Estimated: >60 mL/min (ref >60)
Glucose, Bld: 290 mg/dL — ABNORMAL HIGH (ref 70–99)
Potassium: 3.5 mmol/L (ref 3.5–5.1)
Sodium: 148 mmol/L — ABNORMAL HIGH (ref 135–145)
Total Bilirubin: 0.5 mg/dL (ref 0.3–1.2)
Total Protein: 7.9 g/dL (ref 6.5–8.1)

## 2021-12-01 LAB — RESP PANEL BY RT-PCR (FLU A&B, COVID) ARPGX2
Influenza A by PCR: NEGATIVE
Influenza B by PCR: NEGATIVE
SARS Coronavirus 2 by RT PCR: NEGATIVE

## 2021-12-01 LAB — PROTIME-INR
INR: 1.2 (ref 0.8–1.2)
Prothrombin Time: 15.3 seconds — ABNORMAL HIGH (ref 11.4–15.2)

## 2021-12-01 LAB — LACTIC ACID, PLASMA
Lactic Acid, Venous: 3.6 mmol/L (ref 0.5–1.9)
Lactic Acid, Venous: 2.8 mmol/L (ref 0.5–1.9)

## 2021-12-01 LAB — LIPASE, BLOOD: Lipase: 150 U/L — ABNORMAL HIGH (ref 11–51)

## 2021-12-01 LAB — MAGNESIUM: Magnesium: 2.1 mg/dL (ref 1.7–2.4)

## 2021-12-01 LAB — CBG MONITORING, ED: Glucose-Capillary: 268 mg/dL — ABNORMAL HIGH (ref 70–99)

## 2021-12-01 IMAGING — CT CT HEAD W/O CM
4 series · 16 of 47 positions shown, 18 images · non-contrast
Comparison: CT [DATE], [DATE], [DATE]

CLINICAL DATA: Mental status change, fever sepsis history of
traumatic brain injury



[Series 2: head w o · axial · 0.54mm/px · z∈[+16,+141]mm · 7 of 35 slices shown, 9 images]
[im 5/35  brain]
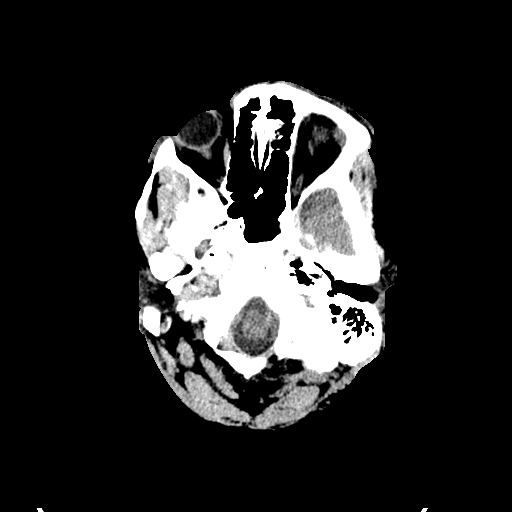
[im 5/35  bone]
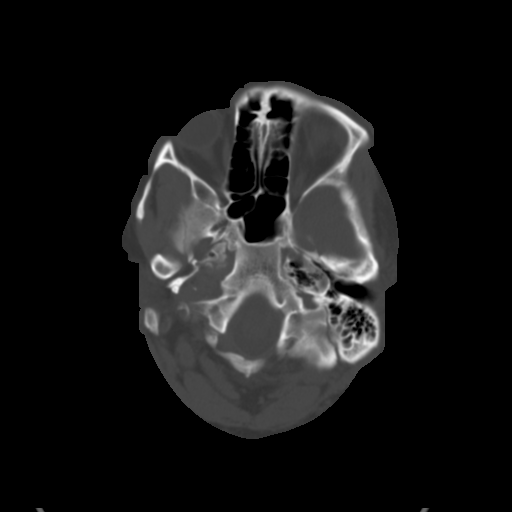
[im 9/35  brain]
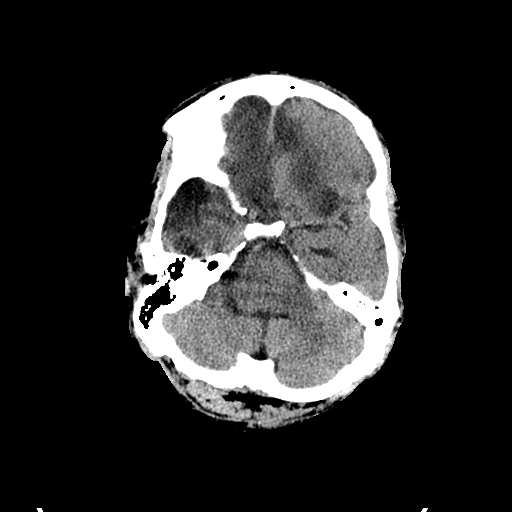
[im 13/35  brain]
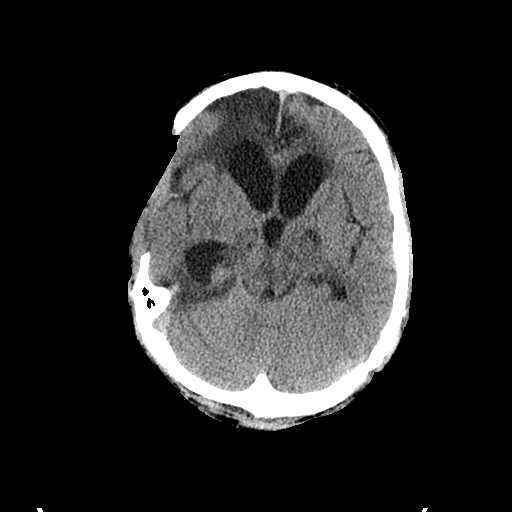
[im 18/35  brain]
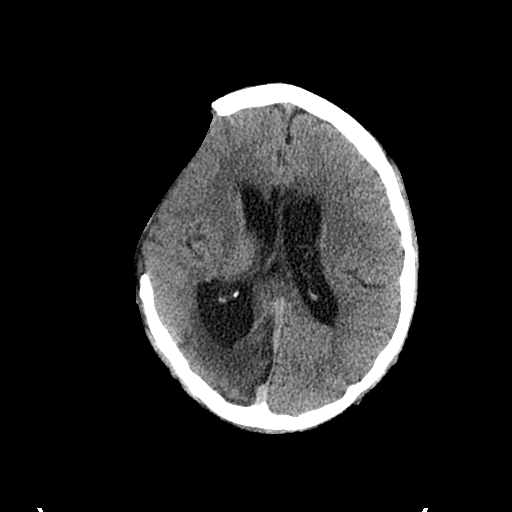
[im 22/35  brain]
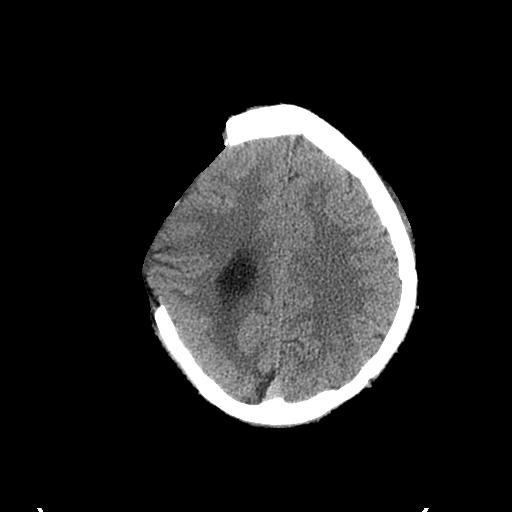
[im 22/35  bone]
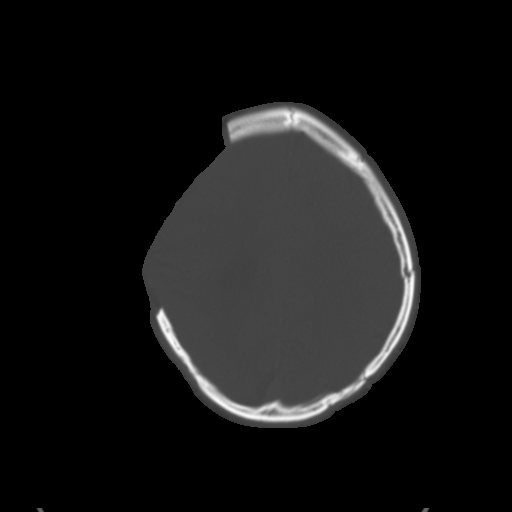
[im 26/35  brain]
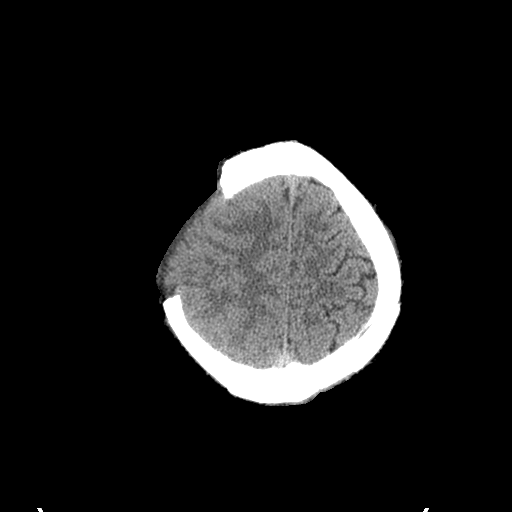
[im 30/35  brain]
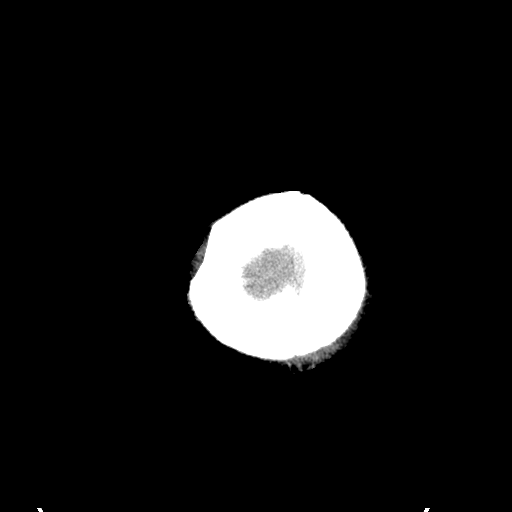

[Series 3: head bone · axial · 0.54mm/px · z∈[+12,+46]mm · 3 of 87 slices shown]
[im 9/87  bone]
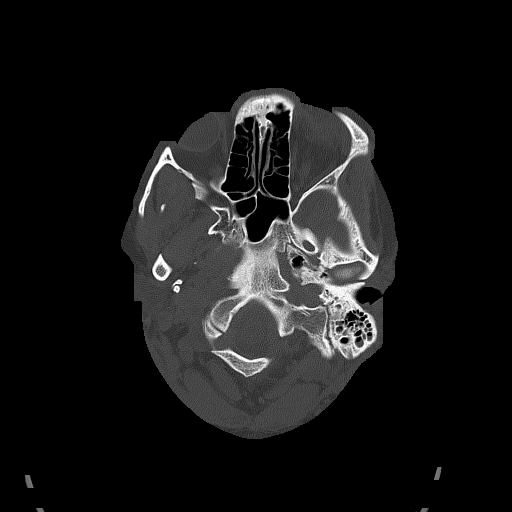
[im 18/87  bone]
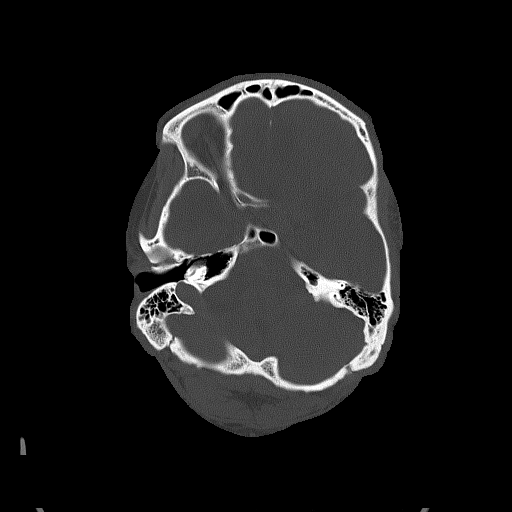
[im 26/87  bone]
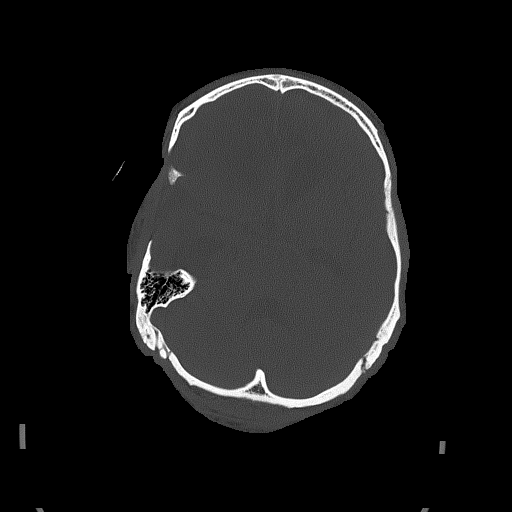

[Series 4: coronal soft · coronal · 0.37mm/px · 3 of 76 slices shown]
[im 26/76  brain]
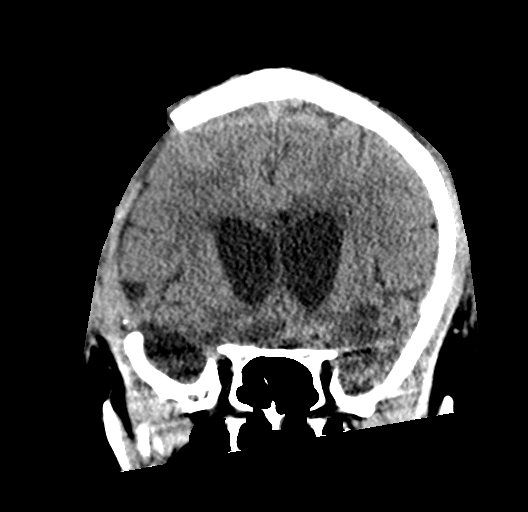
[im 34/76  brain]
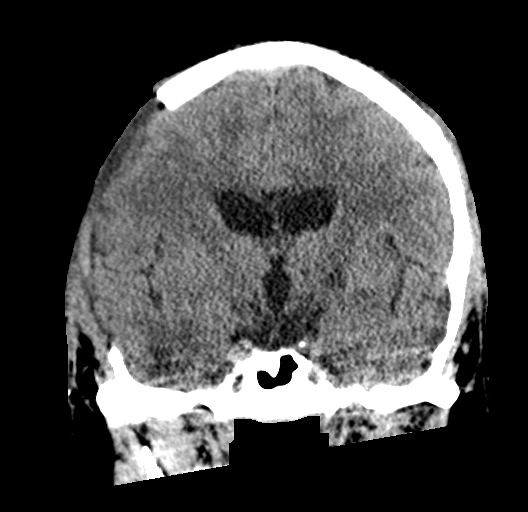
[im 42/76  brain]
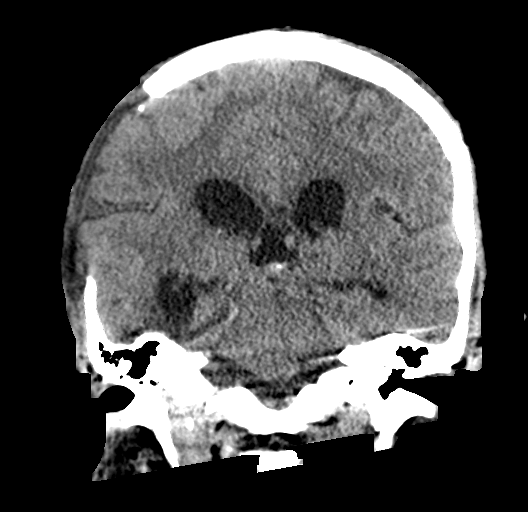

[Series 5: sagittal soft · sagittal · 0.37mm/px · 3 of 66 slices shown]
[im 27/66  brain]
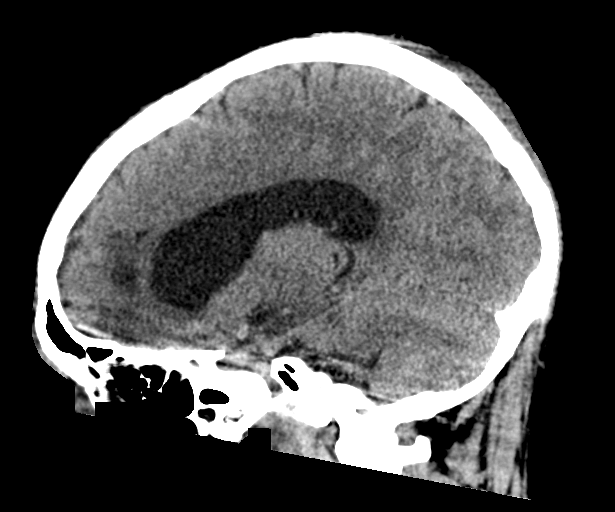
[im 33/66  brain]
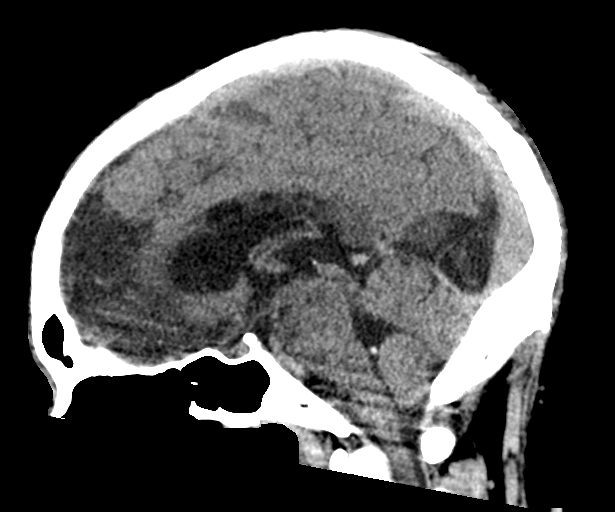
[im 39/66  brain]
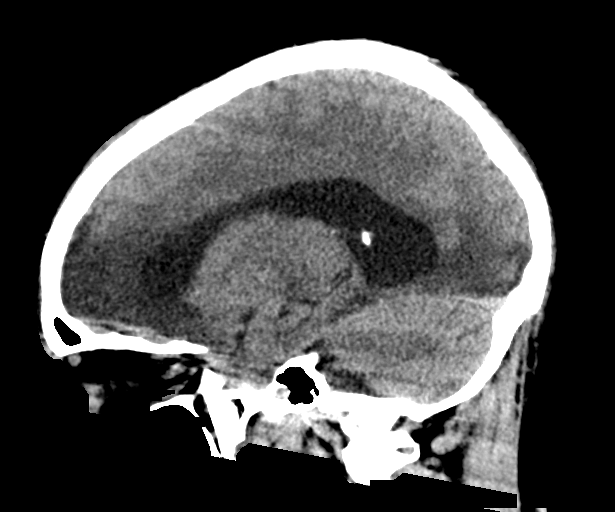

[16 of 47 positions shown; findings below may reference images not displayed]

FINDINGS: Brain: No acute territorial infarction or evidence for acute
hemorrhage or intracranial mass. Extensive encephalomalacia
involving the right greater than left frontal and temporal lobes as
well as the right occipital lobe. Small focus of encephalomalacia at
the right parietal vertex corresponding to history of prior
traumatic brain injury. Mild dural thickening on the right at the
site of craniectomy. Interim development of thin subdural collection
measuring 8 mm maximum thickness at the craniectomy site, appears
late subacute to chronic. The ventricles are enlarged but stable in
size, likely due to ex vacuo dilatation.

Vascular: No hyperdense vessels.  No unexpected calcification

Skull: Right craniectomy. Previous left calvarial and skull base
fracture.

Sinuses/Orbits: No acute finding.

Other: None
IMPRESSION: 1. Extensive right greater than left encephalomalacia corresponding
to traumatic brain injury without acute interval finding since the
head CT from [DATE]. Status post right craniectomy with interim development of more
chronic appearing subdural fluid collection at the craniectomy site.
No midline shift or mass effect.
3. Redemonstrated complex left calvarial and skull base fractures.

## 2021-12-01 IMAGING — DX DG CHEST 1V PORT
1 series · 1 of 1 positions shown · non-contrast
Comparison: [DATE]

CLINICAL DATA: Questionable sepsis - evaluate for abnormality

EXAM:
PORTABLE CHEST 1 VIEW

[chest ap]
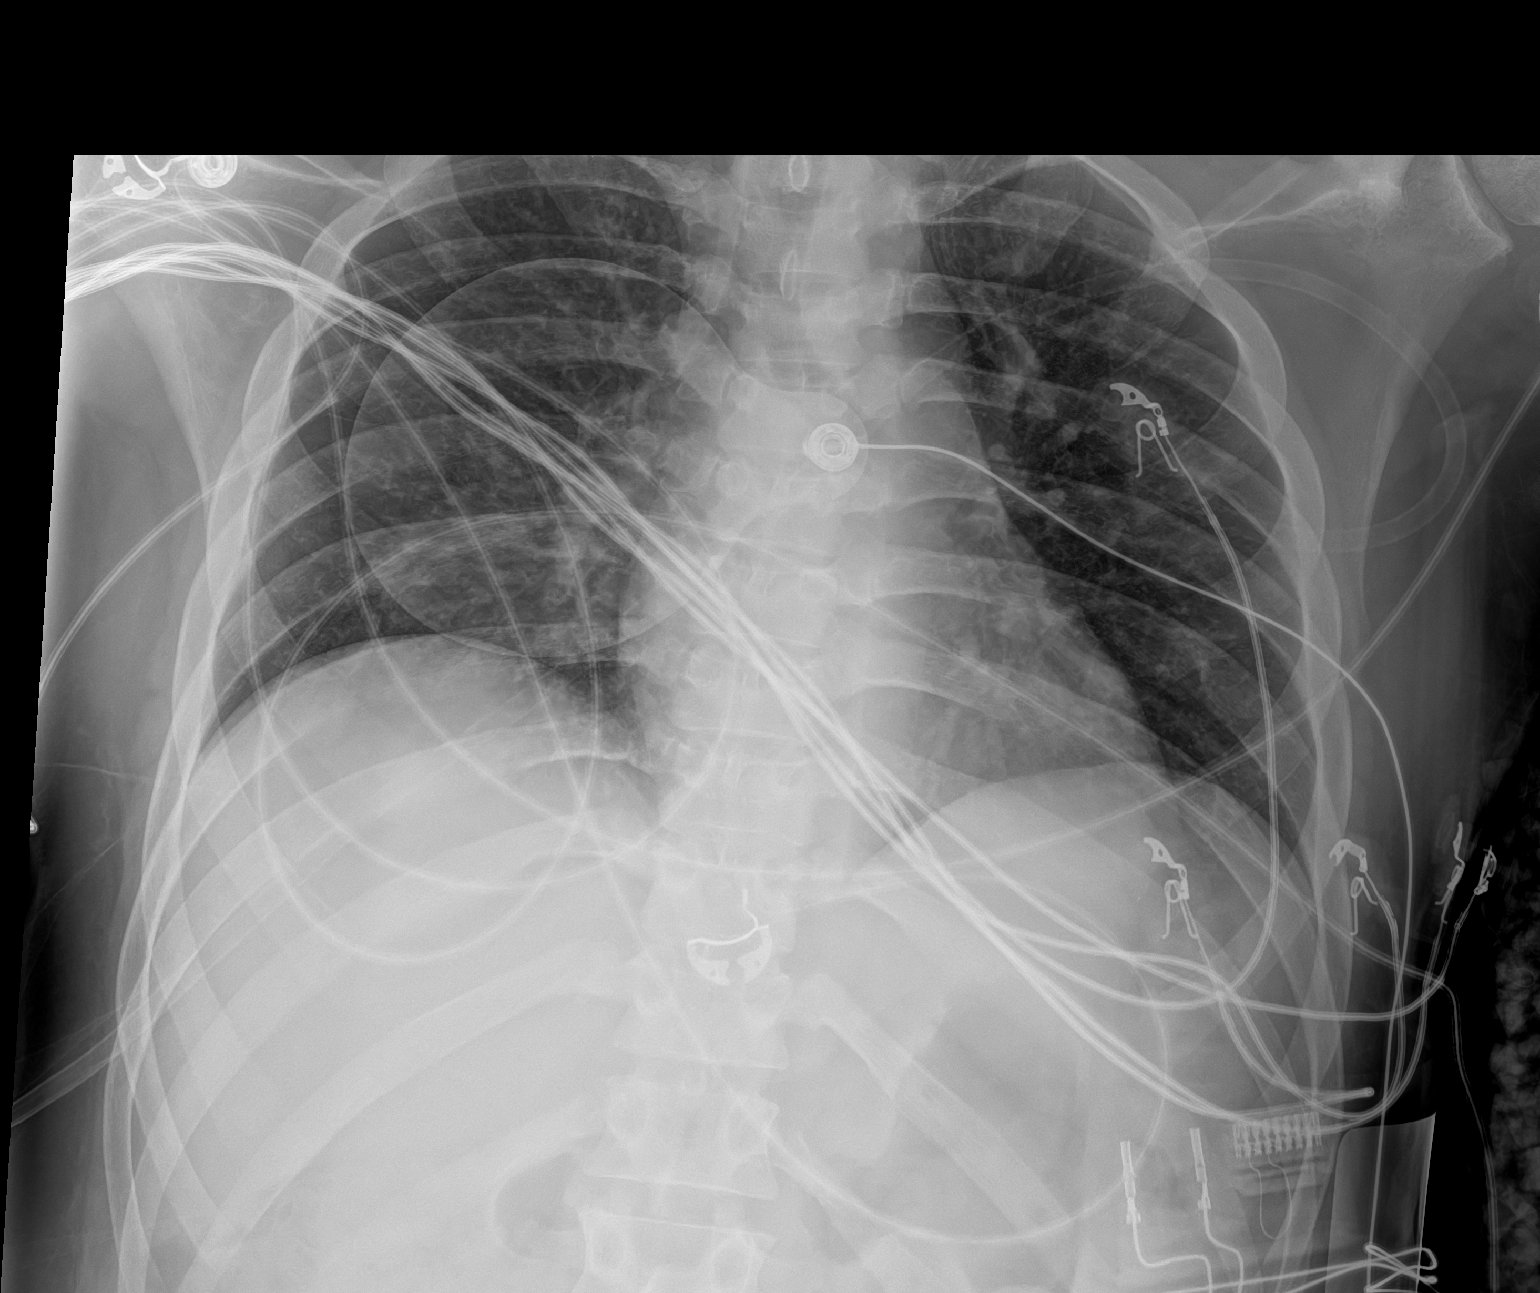

[1 of 1 positions shown; findings below may reference images not displayed]

FINDINGS: The cardiomediastinal silhouette is unchanged in contour. No pleural
effusion. No pneumothorax. No acute pleuroparenchymal abnormality.
Visualized abdomen is unremarkable.
IMPRESSION: No acute cardiopulmonary abnormality.

## 2021-12-01 MED ORDER — LACTATED RINGERS IV BOLUS
1000.0000 mL | Freq: Once | INTRAVENOUS | Status: AC
  Administered 2021-12-01: 1000 mL via INTRAVENOUS

## 2021-12-01 MED ORDER — SODIUM CHLORIDE 0.9 % IV SOLN
2.0000 g | Freq: Once | INTRAVENOUS | Status: AC
  Administered 2021-12-01: 2 g via INTRAVENOUS
  Filled 2021-12-01: qty 12.5

## 2021-12-01 MED ORDER — LACTATED RINGERS IV BOLUS (SEPSIS)
1000.0000 mL | Freq: Once | INTRAVENOUS | Status: AC
Start: 2021-12-01 — End: 2021-12-01
  Administered 2021-12-01: 1000 mL via INTRAVENOUS

## 2021-12-01 MED ORDER — METOPROLOL TARTRATE 5 MG/5ML IV SOLN
5.0000 mg | Freq: Once | INTRAVENOUS | Status: AC
  Administered 2021-12-01: 5 mg via INTRAVENOUS
  Filled 2021-12-01: qty 5

## 2021-12-01 MED ORDER — SODIUM CHLORIDE 0.9 % IV SOLN
2.0000 g | Freq: Three times a day (TID) | INTRAVENOUS | Status: DC
  Administered 2021-12-02 – 2021-12-05 (×11): 2 g via INTRAVENOUS
  Filled 2021-12-01 (×11): qty 12.5

## 2021-12-01 MED ORDER — LACTATED RINGERS IV BOLUS (SEPSIS)
1000.0000 mL | Freq: Once | INTRAVENOUS | Status: AC
  Administered 2021-12-01: 1000 mL via INTRAVENOUS

## 2021-12-01 MED ORDER — VANCOMYCIN HCL 1750 MG/350ML IV SOLN: 1750.0000 mg | Freq: Once | INTRAVENOUS | Status: DC

## 2021-12-01 MED ORDER — VANCOMYCIN HCL IN DEXTROSE 1-5 GM/200ML-% IV SOLN
1000.0000 mg | Freq: Once | INTRAVENOUS | Status: DC
  Administered 2021-12-01: 1000 mg via INTRAVENOUS
  Filled 2021-12-01: qty 200

## 2021-12-01 MED ORDER — VANCOMYCIN HCL 750 MG/150ML IV SOLN
750.0000 mg | Freq: Once | INTRAVENOUS | Status: AC
  Administered 2021-12-01: 750 mg via INTRAVENOUS
  Filled 2021-12-01 (×2): qty 150

## 2021-12-01 MED ORDER — VANCOMYCIN HCL IN DEXTROSE 1-5 GM/200ML-% IV SOLN
1000.0000 mg | Freq: Three times a day (TID) | INTRAVENOUS | Status: DC
  Administered 2021-12-02: 1000 mg via INTRAVENOUS
  Filled 2021-12-01: qty 200

## 2021-12-01 MED ORDER — LACTATED RINGERS IV SOLN: INTRAVENOUS | Status: DC

## 2021-12-01 MED ORDER — LORAZEPAM 2 MG/ML IJ SOLN
1.0000 mg | Freq: Once | INTRAMUSCULAR | Status: AC
  Administered 2021-12-01: 1 mg via INTRAVENOUS
  Filled 2021-12-01: qty 1

## 2021-12-01 MED ORDER — ACETAMINOPHEN 650 MG RE SUPP
975.0000 mg | Freq: Once | RECTAL | Status: AC
  Administered 2021-12-01: 975 mg via RECTAL

## 2021-12-01 MED ORDER — LABETALOL HCL 5 MG/ML IV SOLN: 20.0000 mg | Freq: Once | INTRAVENOUS | Status: DC

## 2021-12-01 MED ORDER — METRONIDAZOLE 500 MG/100ML IV SOLN
500.0000 mg | Freq: Once | INTRAVENOUS | Status: AC
  Administered 2021-12-01: 500 mg via INTRAVENOUS
  Filled 2021-12-01: qty 100

## 2021-12-01 NOTE — ED Provider Notes (Addendum)
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 (HCC): chronic illness or injury with exacerbation, progression, or side effects of treatment that poses a threat to life or bodily functions  Amount and/or Complexity of Data Reviewed Independent Historian: EMS    Details: hx External Data Reviewed: notes. Labs: ordered. Decision-making details documented in ED Course. Radiology: ordered and independent interpretation performed. Decision-making details documented in ED Course. ECG/medicine tests: ordered. Discussion of management or test interpretation with external provider(s): Neurology/hospitalist, icu doc - discussed pt.   Risk Prescription drug  management. Decision regarding hospitalization.   Iv ns. Continuous pulse ox and cardiac monitoring. Labs ordered/sent. Imaging ordered.   Reviewed nursing notes and prior charts for additional history. External reports reviewed. Additional history from: EMS.   Cardiac monitor: sinus rhythm, rate 170.   Cultures sent. LR bolus ordered. Iv abx ordered.   Labs reviewed/interpreted by me - wbc high. Lactate high. 30 cc/kg lr.  Urine is grossly cloudy/bloody - ua is pending, ?source of infection/sepsis.   Cooling measures.   Xrays reviewed/interpreted by me - no pna.   Cooling measures, rectal acetaminophen. Metoprolol iv.  On review prior records, notes made of recurrent periods fever, tachypnea, tachycardia during hospital stay - was felt due to 'neurostorming' phenomena.   Recheck, hr improved, currently 140. Temp improved.   Neurology consulted re ?neurostorm vs sepsis. Neurology agrees with hospitalist admit. Says if storming is part of clinical picture, recommends bblocker, acetaminophen, minimizing stimulation, and general supportive measures.   Hospitalists consulted for admission - [facility]  On discussion with hospitalist indicates [facility] - ICU team consulted.   CTs pending.   CRITICAL CARE RE: severe fever/107 with tachycardia, hr 170s, sepsis, uti, tbi, neurostorm Performed by: Suzi RootsKevin E Bubba Vanbenschoten Total critical care time: 125 minutes Critical care time was exclusive of separately billable procedures and treating other patients. Critical care was necessary to treat or prevent imminent or life-threatening deterioration. Critical care was time spent personally by me on the following activities: development of treatment plan with patient and/or surrogate as well as nursing, discussions with consultants, evaluation of patient's response to treatment, examination of patient, obtaining history from patient or surrogate, ordering  and performing treatments and interventions, ordering and review of laboratory studies, ordering and review of radiographic studies, pulse oximetry and re-evaluation of patient's condition.   Discussed pt with Dr Jayme CloudGonzalez, critical [facility] at [facility], who accepts in transfer/admit.   Discussed w Dr Judd Lienelo to check pending cts.   Recheck pt, temp improved from initial. Additional acetaminophen po. O2 sats 99%.        Final Clinical Impression(s) / ED Diagnoses Final diagnoses:  None    Rx / DC Orders ED Discharge Orders     None           Cathren LaineSteinl, Caydence Koenig, MD 12/02/21 [phone]

## 2021-12-01 NOTE — Progress Notes (Signed)
Pharmacy Antibiotic Note  Sean Bruce a 26 y.o. male admitted on 12/01/2021 with sepsis.  Pharmacy has been consulted for vancomycin and cefepime dosing.  Plan: Vancomycin 1000mg  IV every 8 hours.  Goal trough 15-20 mcg/mL. Cefepime 2gm IV every 8 hours.  Medical History: History reviewed. No pertinent past medical history.  Allergies:  No Known Allergies  Filed Weights   12/01/21 2014  Weight: 83.9 kg (184 lb 15.5 oz)       Latest Ref Rng & Units 12/01/2021    7:35 PM 11/19/2021    9:43 AM 11/13/2021    1:40 AM  CBC  WBC 4.0 - 10.5 K/uL 18.5   11.0   12.4    Hemoglobin 13.0 - 17.0 g/dL 11/15/2021   32.6   71.2    Hematocrit 39.0 - 52.0 % 52.5   42.2   43.1    Platelets 150 - 400 K/uL 247   190   217       Estimated Creatinine Clearance: 152.2 mL/min (A) (by C-G formula based on SCr of 0.54 mg/dL (L)).  Antibiotics Given (last 72 hours)     Date/Time Action Medication Dose Rate   12/01/21 2008 New Bag/Given   metroNIDAZOLE (FLAGYL) IVPB 500 mg 500 mg 100 mL/hr   12/01/21 2010 New Bag/Given   vancomycin (VANCOCIN) IVPB 1000 mg/200 mL premix 1,000 mg 200 mL/hr   12/01/21 2012 New Bag/Given   ceFEPIme (MAXIPIME) 2 g in sodium chloride 0.9 % 100 mL IVPB 2 g 200 mL/hr       Antimicrobials this admission:  Cefepime 12/01/2021 >>  vancomycin 12/01/2021  >>  Metronidazole 12/01/2021   x 1   Microbiology results: 12/01/2021  BCx: sent 12/01/2021  UCx: sent 12/01/2021  Resp Panel: sent  12/01/2021  MRSA PCR: sent  Thank you for allowing pharmacy to be a part of this patient's care.  01/31/2022, PharmD Clinical Pharmacist

## 2021-12-01 NOTE — ED Notes (Signed)
Patient transported to CT 
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
## 2021-12-01 NOTE — ED Triage Notes (Signed)
Pt brought in by [facility] for high vital signs. HR 178 in triage, MD at bed side.

## 2021-12-01 NOTE — ED Notes (Signed)
Family updated as to patient's status.

## 2021-12-01 NOTE — ED Notes (Signed)
Date and time results received: 12/01/21 2012   Test: LACTIC Critical Value: 3.6  Name of Provider Notified: Denton Lank, MD

## 2021-12-01 NOTE — Progress Notes (Signed)
Pt being followed by [facility] for Sepsis protocol. 

## 2021-12-01 NOTE — Progress Notes (Signed)
Rt assessed the trach site and wound is still actively bleeding bright red blood. There are clots trying to form from site. Stoma is either been a fresh decannulation or trach has accidentally removed due to the amount of blood coming from the site. RT placed a 2x2 gauze over site until physician can look at wound. Rt will continue to montior patient status and he is on 2lpm [place].

## 2021-12-02 ENCOUNTER — Inpatient Hospital Stay
Admission: AD | Admit: 2021-12-02 | Payer: Self-pay | Source: Other Acute Inpatient Hospital | Admitting: Pulmonary Disease

## 2021-12-02 DIAGNOSIS — E861 Hypovolemia: Secondary | ICD-10-CM | POA: Diagnosis present

## 2021-12-02 DIAGNOSIS — A419 Sepsis, unspecified organism: Secondary | ICD-10-CM | POA: Diagnosis not present

## 2021-12-02 DIAGNOSIS — L89312 Pressure ulcer of right buttock, stage 2: Secondary | ICD-10-CM | POA: Diagnosis present

## 2021-12-02 DIAGNOSIS — N179 Acute kidney failure, unspecified: Secondary | ICD-10-CM | POA: Diagnosis present

## 2021-12-02 DIAGNOSIS — Z8673 Personal history of transient ischemic attack (TIA), and cerebral infarction without residual deficits: Secondary | ICD-10-CM | POA: Diagnosis not present

## 2021-12-02 DIAGNOSIS — A4152 Sepsis due to Pseudomonas: Secondary | ICD-10-CM | POA: Diagnosis present

## 2021-12-02 DIAGNOSIS — Z79899 Other long term (current) drug therapy: Secondary | ICD-10-CM | POA: Diagnosis not present

## 2021-12-02 DIAGNOSIS — R6520 Severe sepsis without septic shock: Secondary | ICD-10-CM

## 2021-12-02 DIAGNOSIS — J690 Pneumonitis due to inhalation of food and vomit: Secondary | ICD-10-CM | POA: Diagnosis present

## 2021-12-02 DIAGNOSIS — R739 Hyperglycemia, unspecified: Secondary | ICD-10-CM | POA: Diagnosis present

## 2021-12-02 DIAGNOSIS — Z930 Tracheostomy status: Secondary | ICD-10-CM | POA: Diagnosis not present

## 2021-12-02 DIAGNOSIS — E870 Hyperosmolality and hypernatremia: Secondary | ICD-10-CM | POA: Diagnosis present

## 2021-12-02 DIAGNOSIS — E876 Hypokalemia: Secondary | ICD-10-CM | POA: Diagnosis present

## 2021-12-02 DIAGNOSIS — L8990 Pressure ulcer of unspecified site, unspecified stage: Secondary | ICD-10-CM | POA: Insufficient documentation

## 2021-12-02 DIAGNOSIS — J189 Pneumonia, unspecified organism: Secondary | ICD-10-CM | POA: Diagnosis present

## 2021-12-02 DIAGNOSIS — Z794 Long term (current) use of insulin: Secondary | ICD-10-CM | POA: Diagnosis not present

## 2021-12-02 DIAGNOSIS — N390 Urinary tract infection, site not specified: Secondary | ICD-10-CM | POA: Diagnosis present

## 2021-12-02 DIAGNOSIS — Z1152 Encounter for screening for COVID-19: Secondary | ICD-10-CM | POA: Diagnosis not present

## 2021-12-02 DIAGNOSIS — T83511A Infection and inflammatory reaction due to indwelling urethral catheter, initial encounter: Secondary | ICD-10-CM | POA: Diagnosis present

## 2021-12-02 DIAGNOSIS — Z87820 Personal history of traumatic brain injury: Secondary | ICD-10-CM | POA: Diagnosis not present

## 2021-12-02 DIAGNOSIS — R000 Tachycardia, unspecified: Secondary | ICD-10-CM | POA: Diagnosis present

## 2021-12-02 DIAGNOSIS — R532 Functional quadriplegia: Secondary | ICD-10-CM | POA: Diagnosis present

## 2021-12-02 DIAGNOSIS — S069X9D Unspecified intracranial injury with loss of consciousness of unspecified duration, subsequent encounter: Secondary | ICD-10-CM | POA: Diagnosis not present

## 2021-12-02 DIAGNOSIS — A4181 Sepsis due to Enterococcus: Secondary | ICD-10-CM | POA: Diagnosis present

## 2021-12-02 DIAGNOSIS — Z7982 Long term (current) use of aspirin: Secondary | ICD-10-CM | POA: Diagnosis not present

## 2021-12-02 DIAGNOSIS — D649 Anemia, unspecified: Secondary | ICD-10-CM | POA: Diagnosis present

## 2021-12-02 DIAGNOSIS — D6959 Other secondary thrombocytopenia: Secondary | ICD-10-CM | POA: Diagnosis present

## 2021-12-02 DIAGNOSIS — Y846 Urinary catheterization as the cause of abnormal reaction of the patient, or of later complication, without mention of misadventure at the time of the procedure: Secondary | ICD-10-CM | POA: Diagnosis present

## 2021-12-02 LAB — COMPREHENSIVE METABOLIC PANEL
ALT: 244 U/L — ABNORMAL HIGH (ref 0–44)
AST: 118 U/L — ABNORMAL HIGH (ref 15–41)
Albumin: 2.2 g/dL — ABNORMAL LOW (ref 3.5–5.0)
Alkaline Phosphatase: 54 U/L (ref 38–126)
Anion gap: 9 (ref 5–15)
BUN: 14 mg/dL (ref 6–20)
CO2: 20 mmol/L — ABNORMAL LOW (ref 22–32)
Calcium: 7.5 mg/dL — ABNORMAL LOW (ref 8.9–10.3)
Chloride: 116 mmol/L — ABNORMAL HIGH (ref 98–111)
Creatinine, Ser: 0.56 mg/dL — ABNORMAL LOW (ref 0.61–1.24)
GFR, Estimated: >60 mL/min (ref >60)
Glucose, Bld: 153 mg/dL — ABNORMAL HIGH (ref 70–99)
Potassium: 3.5 mmol/L (ref 3.5–5.1)
Sodium: 145 mmol/L (ref 135–145)
Total Bilirubin: 0.9 mg/dL (ref 0.3–1.2)
Total Protein: 5.2 g/dL — ABNORMAL LOW (ref 6.5–8.1)

## 2021-12-02 LAB — URINALYSIS, ROUTINE W REFLEX MICROSCOPIC
Bilirubin Urine: NEGATIVE
Glucose, UA: 50 mg/dL — AB
Ketones, ur: 5 mg/dL — AB
Nitrite: NEGATIVE
Protein, ur: >=300 mg/dL — AB
RBC / HPF: >50 RBC/hpf — ABNORMAL HIGH (ref 0–5)
Specific Gravity, Urine: 1.031 — ABNORMAL HIGH (ref 1.005–1.030)
pH: 5 (ref 5.0–8.0)

## 2021-12-02 LAB — CBC
HCT: 43.5 % (ref 39.0–52.0)
Hemoglobin: 13.3 g/dL (ref 13.0–17.0)
MCH: 30 pg (ref 26.0–34.0)
MCHC: 30.6 g/dL (ref 30.0–36.0)
MCV: 98.2 fL (ref 80.0–100.0)
Platelets: UNDETERMINED 10*3/uL (ref 150–400)
RBC: 4.43 MIL/uL (ref 4.22–5.81)
RDW: 14.5 % (ref 11.5–15.5)
WBC: 12.9 10*3/uL — ABNORMAL HIGH (ref 4.0–10.5)
nRBC: 0.2 % (ref 0.0–0.2)

## 2021-12-02 LAB — BLOOD GAS, VENOUS
Acid-base deficit: 3.6 mmol/L — ABNORMAL HIGH (ref 0.0–2.0)
Bicarbonate: 23.1 mmol/L (ref 20.0–28.0)
Drawn by: 64037
O2 Saturation: 76 %
Patient temperature: 38.5
pCO2, Ven: 50 mmHg (ref 44–60)
pH, Ven: 7.28 (ref 7.25–7.43)
pO2, Ven: 51 mmHg — ABNORMAL HIGH (ref 32–45)

## 2021-12-02 LAB — MAGNESIUM
Magnesium: 1.8 mg/dL (ref 1.7–2.4)
Magnesium: 2.3 mg/dL (ref 1.7–2.4)

## 2021-12-02 LAB — GLUCOSE, CAPILLARY
Glucose-Capillary: 135 mg/dL — ABNORMAL HIGH (ref 70–99)
Glucose-Capillary: 138 mg/dL — ABNORMAL HIGH (ref 70–99)
Glucose-Capillary: 139 mg/dL — ABNORMAL HIGH (ref 70–99)
Glucose-Capillary: 145 mg/dL — ABNORMAL HIGH (ref 70–99)
Glucose-Capillary: 151 mg/dL — ABNORMAL HIGH (ref 70–99)
Glucose-Capillary: 182 mg/dL — ABNORMAL HIGH (ref 70–99)

## 2021-12-02 LAB — LACTIC ACID, PLASMA: Lactic Acid, Venous: 1.7 mmol/L (ref 0.5–1.9)

## 2021-12-02 LAB — MRSA NEXT GEN BY PCR, NASAL: MRSA by PCR Next Gen: NOT DETECTED

## 2021-12-02 LAB — PHOSPHORUS: Phosphorus: 3 mg/dL (ref 2.5–4.6)
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
## 2021-12-02 MED ORDER — METOPROLOL TARTRATE 25 MG/10 ML ORAL SUSPENSION
100.0000 mg | Freq: Two times a day (BID) | ORAL | Status: DC
  Administered 2021-12-02 – 2021-12-05 (×7): 100 mg
  Filled 2021-12-02 (×8): qty 40

## 2021-12-02 MED ORDER — BETHANECHOL CHLORIDE 10 MG PO TABS
10.0000 mg | ORAL_TABLET | Freq: Three times a day (TID) | ORAL | Status: DC
  Administered 2021-12-02 – 2021-12-05 (×11): 10 mg
  Filled 2021-12-02 (×13): qty 1

## 2021-12-02 MED ORDER — CHLORHEXIDINE GLUCONATE 0.12% ORAL RINSE (MEDLINE KIT)
15.0000 mL | Freq: Two times a day (BID) | OROMUCOSAL | Status: DC
  Administered 2021-12-02: 15 mL via OROMUCOSAL

## 2021-12-02 MED ORDER — PANTOPRAZOLE 2 MG/ML SUSPENSION
40.0000 mg | Freq: Every day | ORAL | Status: DC
  Administered 2021-12-02 – 2021-12-04 (×3): 40 mg
  Filled 2021-12-02 (×4): qty 20

## 2021-12-02 MED ORDER — AMANTADINE HCL 100 MG PO CAPS
100.0000 mg | ORAL_CAPSULE | Freq: Two times a day (BID) | ORAL | Status: DC
  Administered 2021-12-02 (×2): 100 mg
  Filled 2021-12-02 (×3): qty 1

## 2021-12-02 MED ORDER — ENOXAPARIN SODIUM 40 MG/0.4ML IJ SOSY
30.0000 mg | PREFILLED_SYRINGE | Freq: Two times a day (BID) | INTRAMUSCULAR | Status: DC
Start: 2021-12-02 — End: 2021-12-02

## 2021-12-02 MED ORDER — PROSOURCE TF PO LIQD
45.0000 mL | Freq: Three times a day (TID) | ORAL | Status: DC
  Administered 2021-12-02 – 2021-12-05 (×11): 45 mL
  Filled 2021-12-02 (×12): qty 45

## 2021-12-02 MED ORDER — METRONIDAZOLE 500 MG/100ML IV SOLN: 500.0000 mg | Freq: Two times a day (BID) | INTRAVENOUS | Status: DC

## 2021-12-02 MED ORDER — ENOXAPARIN SODIUM 40 MG/0.4ML IJ SOSY
40.0000 mg | PREFILLED_SYRINGE | INTRAMUSCULAR | Status: DC
  Filled 2021-12-02: qty 0.4

## 2021-12-02 MED ORDER — POLYETHYLENE GLYCOL 3350 17 G PO PACK
17.0000 g | PACK | Freq: Every day | ORAL | Status: DC
Start: 2021-12-02 — End: 2021-12-06
  Administered 2021-12-02 – 2021-12-04 (×3): 17 g
  Filled 2021-12-02 (×3): qty 1

## 2021-12-02 MED ORDER — ORAL CARE MOUTH RINSE
15.0000 mL | Freq: Two times a day (BID) | OROMUCOSAL | Status: DC
  Administered 2021-12-02 – 2021-12-05 (×7): 15 mL via OROMUCOSAL

## 2021-12-02 MED ORDER — IOHEXOL 300 MG/ML  SOLN
100.0000 mL | Freq: Once | INTRAMUSCULAR | Status: AC | PRN
  Administered 2021-12-02: 100 mL via INTRAVENOUS

## 2021-12-02 MED ORDER — AMANTADINE HCL 50 MG/5ML PO SOLN
100.0000 mg | Freq: Two times a day (BID) | ORAL | Status: DC
  Administered 2021-12-03 – 2021-12-05 (×6): 100 mg
  Filled 2021-12-02 (×7): qty 10

## 2021-12-02 MED ORDER — SODIUM CHLORIDE 0.9 % IV SOLN: INTRAVENOUS | Status: DC

## 2021-12-02 MED ORDER — BISACODYL 10 MG RE SUPP
10.0000 mg | Freq: Every day | RECTAL | Status: DC
Start: 2021-12-02 — End: 2021-12-06
  Administered 2021-12-03 – 2021-12-04 (×2): 10 mg via RECTAL
  Filled 2021-12-02 (×2): qty 1

## 2021-12-02 MED ORDER — OSMOLITE 1.5 CAL PO LIQD
1000.0000 mL | ORAL | Status: DC
  Administered 2021-12-02 – 2021-12-05 (×3): 1000 mL
  Filled 2021-12-02 (×7): qty 1000

## 2021-12-02 MED ORDER — DOCUSATE SODIUM 50 MG/5ML PO LIQD: 100.0000 mg | Freq: Two times a day (BID) | ORAL | Status: DC | PRN

## 2021-12-02 MED ORDER — INSULIN ASPART 100 UNIT/ML IJ SOLN
0.0000 [IU] | INTRAMUSCULAR | Status: DC
  Administered 2021-12-02: 3 [IU] via SUBCUTANEOUS
  Administered 2021-12-02: 2 [IU] via SUBCUTANEOUS
  Administered 2021-12-02: 3 [IU] via SUBCUTANEOUS
  Administered 2021-12-02: 2 [IU] via SUBCUTANEOUS
  Administered 2021-12-03 (×3): 3 [IU] via SUBCUTANEOUS
  Administered 2021-12-03: 2 [IU] via SUBCUTANEOUS
  Administered 2021-12-03 (×2): 3 [IU] via SUBCUTANEOUS
  Administered 2021-12-04: 2 [IU] via SUBCUTANEOUS
  Administered 2021-12-04 (×4): 3 [IU] via SUBCUTANEOUS
  Administered 2021-12-04: 2 [IU] via SUBCUTANEOUS
  Administered 2021-12-05: 3 [IU] via SUBCUTANEOUS
  Administered 2021-12-05 (×2): 2 [IU] via SUBCUTANEOUS
  Administered 2021-12-05: 3 [IU] via SUBCUTANEOUS
  Administered 2021-12-05: 2 [IU] via SUBCUTANEOUS

## 2021-12-02 MED ORDER — POTASSIUM CHLORIDE 10 MEQ/100ML IV SOLN
10.0000 meq | INTRAVENOUS | Status: AC
  Administered 2021-12-02 (×4): 10 meq via INTRAVENOUS
  Filled 2021-12-02 (×4): qty 100

## 2021-12-02 MED ORDER — ORAL CARE MOUTH RINSE: 15.0000 mL | OROMUCOSAL | Status: DC

## 2021-12-02 MED ORDER — CHLORHEXIDINE GLUCONATE 0.12 % MT SOLN
15.0000 mL | Freq: Two times a day (BID) | OROMUCOSAL | Status: DC
  Administered 2021-12-02 – 2021-12-05 (×8): 15 mL via OROMUCOSAL
  Filled 2021-12-02 (×7): qty 15

## 2021-12-02 MED ORDER — POLYETHYLENE GLYCOL 3350 17 G PO PACK: 17.0000 g | PACK | Freq: Every day | ORAL | Status: DC | PRN

## 2021-12-02 MED ORDER — ENOXAPARIN SODIUM 40 MG/0.4ML IJ SOSY
40.0000 mg | PREFILLED_SYRINGE | INTRAMUSCULAR | Status: DC
  Administered 2021-12-02 – 2021-12-05 (×4): 40 mg via SUBCUTANEOUS
  Filled 2021-12-02 (×4): qty 0.4

## 2021-12-02 MED ORDER — MAGNESIUM SULFATE IN D5W 1-5 GM/100ML-% IV SOLN
1.0000 g | Freq: Once | INTRAVENOUS | Status: AC
  Administered 2021-12-02: 1 g via INTRAVENOUS
  Filled 2021-12-02: qty 100

## 2021-12-02 MED ORDER — SODIUM CHLORIDE 0.45 % IV SOLN: INTRAVENOUS | Status: DC

## 2021-12-02 MED ORDER — SODIUM CHLORIDE 0.9 % IV SOLN: INTRAVENOUS | Status: DC | PRN

## 2021-12-02 MED ORDER — POLYETHYLENE GLYCOL 3350 17 G PO PACK
17.0000 g | PACK | Freq: Every day | ORAL | Status: DC | PRN
Start: 2021-12-02 — End: 2021-12-06

## 2021-12-02 MED ORDER — ONDANSETRON HCL 4 MG/2ML IJ SOLN
4.0000 mg | Freq: Four times a day (QID) | INTRAMUSCULAR | Status: DC | PRN
  Filled 2021-12-02: qty 2

## 2021-12-02 MED ORDER — CHLORHEXIDINE GLUCONATE CLOTH 2 % EX PADS
6.0000 | MEDICATED_PAD | Freq: Every day | CUTANEOUS | Status: DC
  Administered 2021-12-02 – 2021-12-05 (×4): 6 via TOPICAL

## 2021-12-02 MED ORDER — DOCUSATE SODIUM 50 MG/5ML PO LIQD
100.0000 mg | Freq: Two times a day (BID) | ORAL | Status: DC
  Administered 2021-12-02 – 2021-12-05 (×5): 100 mg
  Filled 2021-12-02 (×9): qty 10

## 2021-12-02 MED ORDER — ASPIRIN 325 MG PO TABS
325.0000 mg | ORAL_TABLET | Freq: Every day | ORAL | Status: DC
  Administered 2021-12-02 – 2021-12-05 (×4): 325 mg
  Filled 2021-12-02 (×4): qty 1

## 2021-12-02 MED ORDER — LEVETIRACETAM 100 MG/ML PO SOLN
500.0000 mg | Freq: Two times a day (BID) | ORAL | Status: DC
Start: 2021-12-02 — End: 2021-12-06
  Administered 2021-12-02 – 2021-12-05 (×7): 500 mg
  Filled 2021-12-02 (×8): qty 5

## 2021-12-02 MED ORDER — ACETAMINOPHEN 500 MG PO TABS
1000.0000 mg | ORAL_TABLET | Freq: Four times a day (QID) | ORAL | Status: DC | PRN
Start: 2021-12-02 — End: 2021-12-04
  Administered 2021-12-02 – 2021-12-03 (×3): 1000 mg
  Filled 2021-12-02 (×3): qty 2

## 2021-12-02 MED ORDER — PANTOPRAZOLE SODIUM 40 MG IV SOLR: 40.0000 mg | Freq: Every day | INTRAVENOUS | Status: DC
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
## 2021-12-02 NOTE — ED Notes (Signed)
Placed 7 fresh ice packs on pt
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
## 2021-12-02 NOTE — Progress Notes (Signed)
[facility] ADULT ICU REPLACEMENT PROTOCOL   The patient does apply for the [facility] Adult ICU Electrolyte Replacment Protocol based on the criteria listed below:   1.Exclusion criteria: TCTS patients, ECMO patients, and Dialysis patients 2. Is GFR >/= 30 ml/min? Yes.    Patient's GFR today is >60 3. Is SCr </= 2? Yes.   Patient's SCr is 0.56 mg/dL 4. Did SCr increase >/= 0.5 in 24 hours? No. 5.Pt's weight >40kg  Yes.   6. Abnormal electrolyte(s): K  7. Electrolytes replaced per protocol 8.  Call MD STAT for K+ </= 2.5, Phos </= 1, or Mag </= 1 Physician:  [facility] 12/02/2021 6:36 AM
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
## 2021-12-02 NOTE — ED Notes (Signed)
Attempted to call mother and aunt regarding pt disposition. Phone call went straight to voicemail with each family member.

## 2021-12-02 NOTE — Progress Notes (Signed)
RT Note:  New admission via [place]. Stoma closed, crusty,miniminal bleeding. Site cleansed, dried, protective dressing placed. Patient NTS for moderate amount of thick secretions.

## 2021-12-02 NOTE — ED Notes (Addendum)
Attempted to call pt's SNF facility, [facility]. Was transferred with no answer  Pt's booties were left at [facility]. Placed in holdings closet

## 2021-12-03 DIAGNOSIS — R6520 Severe sepsis without septic shock: Secondary | ICD-10-CM

## 2021-12-03 DIAGNOSIS — L89312 Pressure ulcer of right buttock, stage 2: Secondary | ICD-10-CM

## 2021-12-03 DIAGNOSIS — S069X9D Unspecified intracranial injury with loss of consciousness of unspecified duration, subsequent encounter: Secondary | ICD-10-CM

## 2021-12-03 DIAGNOSIS — A419 Sepsis, unspecified organism: Secondary | ICD-10-CM

## 2021-12-03 LAB — GLUCOSE, CAPILLARY
Glucose-Capillary: 172 mg/dL — ABNORMAL HIGH (ref 70–99)
Glucose-Capillary: 165 mg/dL — ABNORMAL HIGH (ref 70–99)
Glucose-Capillary: 188 mg/dL — ABNORMAL HIGH (ref 70–99)
Glucose-Capillary: 131 mg/dL — ABNORMAL HIGH (ref 70–99)
Glucose-Capillary: 171 mg/dL — ABNORMAL HIGH (ref 70–99)

## 2021-12-03 LAB — MAGNESIUM
Magnesium: 1.9 mg/dL (ref 1.7–2.4)
Magnesium: 1.9 mg/dL (ref 1.7–2.4)

## 2021-12-03 LAB — PHOSPHORUS
Phosphorus: 1.7 mg/dL — ABNORMAL LOW (ref 2.5–4.6)
Phosphorus: 1.8 mg/dL — ABNORMAL LOW (ref 2.5–4.6)
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
## 2021-12-03 NOTE — TOC Progression Note (Addendum)
Transition of Care [facility]) - Progression Note    Patient Details  Name: Alaa Omari MRN: [id number] Date of Birth: 1995-11-23  Transition of Care [facility]) CM/SW [place], [place] Phone Number: 12/03/2021, 11:04 AM  Clinical Narrative:    CSW got confirmation that [facility]PS had accepted the case, and had come to the hospital. CSW followed up with facility as no contact had been made from DON. They stated she would reach out soon. TOC will continue to follow to ensure safe discharge.  11:30 CSW spoke with DON Jonelle Sidle and went over concerns. She noted that the CNA's were uncomfortable with moving him and they are concerned that he should be at a different level of care. She noted she is investigating and will attempt to provide more education. She stated her NP said that the fevers are from the brain injury, not an infection. TOC will continue to follow.  Expected Discharge Plan: [place] Barriers to Discharge: Continued Medical Work up  Expected Discharge Plan and Services Expected Discharge Plan: [facility] Acute Care Choice: [place]                                         Social Determinants of Health (SDOH) Interventions    Readmission Risk Interventions     View : No data to display.
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
## 2021-12-04 LAB — URINE CULTURE: Culture: >=100000 — AB

## 2021-12-04 LAB — CBC
HCT: 40.6 % (ref 39.0–52.0)
Hemoglobin: 12.2 g/dL — ABNORMAL LOW (ref 13.0–17.0)
MCH: 29.4 pg (ref 26.0–34.0)
MCHC: 30 g/dL (ref 30.0–36.0)
MCV: 97.8 fL (ref 80.0–100.0)
Platelets: 124 10*3/uL — ABNORMAL LOW (ref 150–400)
RBC: 4.15 MIL/uL — ABNORMAL LOW (ref 4.22–5.81)
RDW: 14.4 % (ref 11.5–15.5)
WBC: 10.4 10*3/uL (ref 4.0–10.5)
nRBC: 0 % (ref 0.0–0.2)

## 2021-12-04 LAB — GLUCOSE, CAPILLARY
Glucose-Capillary: 107 mg/dL — ABNORMAL HIGH (ref 70–99)
Glucose-Capillary: 129 mg/dL — ABNORMAL HIGH (ref 70–99)
Glucose-Capillary: 134 mg/dL — ABNORMAL HIGH (ref 70–99)
Glucose-Capillary: 141 mg/dL — ABNORMAL HIGH (ref 70–99)
Glucose-Capillary: 155 mg/dL — ABNORMAL HIGH (ref 70–99)
Glucose-Capillary: 161 mg/dL — ABNORMAL HIGH (ref 70–99)
Glucose-Capillary: 178 mg/dL — ABNORMAL HIGH (ref 70–99)

## 2021-12-04 LAB — BASIC METABOLIC PANEL
Anion gap: 9 (ref 5–15)
BUN: 11 mg/dL (ref 6–20)
CO2: 25 mmol/L (ref 22–32)
Calcium: 8.7 mg/dL — ABNORMAL LOW (ref 8.9–10.3)
Chloride: 112 mmol/L — ABNORMAL HIGH (ref 98–111)
Creatinine, Ser: 0.41 mg/dL — ABNORMAL LOW (ref 0.61–1.24)
GFR, Estimated: >60 mL/min (ref >60)
Glucose, Bld: 161 mg/dL — ABNORMAL HIGH (ref 70–99)
Potassium: 3.4 mmol/L — ABNORMAL LOW (ref 3.5–5.1)
Sodium: 146 mmol/L — ABNORMAL HIGH (ref 135–145)

## 2021-12-04 MED ORDER — ACETAMINOPHEN 500 MG PO TABS
1000.0000 mg | ORAL_TABLET | Freq: Three times a day (TID) | ORAL | Status: DC
  Administered 2021-12-04 – 2021-12-05 (×5): 1000 mg
  Filled 2021-12-04 (×5): qty 2

## 2021-12-04 MED ORDER — VANCOMYCIN HCL 1750 MG/350ML IV SOLN
1750.0000 mg | Freq: Two times a day (BID) | INTRAVENOUS | Status: DC
  Administered 2021-12-04 – 2021-12-05 (×3): 1750 mg via INTRAVENOUS
  Filled 2021-12-04 (×6): qty 350

## 2021-12-04 MED ORDER — POTASSIUM PHOSPHATES 15 MMOLE/5ML IV SOLN
15.0000 mmol | Freq: Once | INTRAVENOUS | Status: AC
  Administered 2021-12-04: 15 mmol via INTRAVENOUS
  Filled 2021-12-04: qty 5

## 2021-12-04 MED ORDER — KETOROLAC TROMETHAMINE 15 MG/ML IJ SOLN
15.0000 mg | Freq: Three times a day (TID) | INTRAMUSCULAR | Status: DC | PRN
  Administered 2021-12-04: 15 mg via INTRAVENOUS
  Filled 2021-12-04: qty 1

## 2021-12-04 MED ORDER — FREE WATER
100.0000 mL | Status: DC
  Administered 2021-12-04 – 2021-12-05 (×10): 100 mL

## 2021-12-04 NOTE — Progress Notes (Signed)
Pharmacy Antibiotic Note  Sean Bruce is a 26 y.o. male admitted on 12/01/2021 with sepsis.  Pharmacy has been consulted for Vancomycin dosing. Urine culture from 6/4 with E faecalis and Pseudomonas. Already on Cefepime for Pseudomonas coverage, adding Vancomycin. WBC 10.4. Renal function ok.   Plan: Vancomycin 1750 mg IV q12h >>>Estimated AUC: 459 Already on Cefepime Trend WBC, temp, renal function  F/U infectious work-up Drug levels as indicated   Height: 6' (182.9 cm) Weight: 81 kg (178 lb 9.2 oz) IBW/kg (Calculated) : 77.6  Temp (24hrs), Avg:100.1 F (37.8 C), Min:98.5 F (36.9 C), Max:102 F (38.9 C)  Recent Labs  Lab 12/01/21 1935 12/01/21 2201 12/02/21 0449 12/02/21 1014 12/04/21 0342  WBC 18.5*  --  12.9*  --  10.4  CREATININE 0.81  --  0.56*  --  0.41*  LATICACIDVEN 3.6* 2.8*  --  1.7  --     Estimated Creatinine Clearance: 152.2 mL/min (A) (by C-G formula based on SCr of 0.41 mg/dL (L)).    No Known Allergies  Abran Duke, PharmD, BCPS Clinical Pharmacist Phone: [phone]
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
## 2021-12-04 NOTE — TOC Progression Note (Signed)
Transition of Care [facility]) - Progression Note    Patient Details  Name: Sean Bruce MRN: [id number] Date of Birth: 16-Feb-1996  [facility] CM/SW Contact  Delilah Shan, LCSWA Phone Number: 12/04/2021, 12:20 PM  Clinical Narrative:     Patient is from [facility]. Plan is for patient to return when medically ready for dc. APS is following. CSW will continue to follow and assist with patients dc planning needs.  Expected Discharge Plan: Skilled Nursing Facility Barriers to Discharge: Continued Medical Work up  Expected Discharge Plan and Services Expected Discharge Plan: Skilled Nursing Facility     Post Acute Care Choice: Skilled Nursing Facility                                         Social Determinants of Health (SDOH) Interventions    Readmission Risk Interventions     View : No data to display.

## 2021-12-05 LAB — GLUCOSE, CAPILLARY
Glucose-Capillary: 143 mg/dL — ABNORMAL HIGH (ref 70–99)
Glucose-Capillary: 156 mg/dL — ABNORMAL HIGH (ref 70–99)
Glucose-Capillary: 142 mg/dL — ABNORMAL HIGH (ref 70–99)
Glucose-Capillary: 165 mg/dL — ABNORMAL HIGH (ref 70–99)
Glucose-Capillary: 134 mg/dL — ABNORMAL HIGH (ref 70–99)

## 2021-12-05 LAB — CBC
HCT: 36.5 % — ABNORMAL LOW (ref 39.0–52.0)
Hemoglobin: 11.4 g/dL — ABNORMAL LOW (ref 13.0–17.0)
MCH: 30 pg (ref 26.0–34.0)
MCHC: 31.2 g/dL (ref 30.0–36.0)
MCV: 96.1 fL (ref 80.0–100.0)
Platelets: 129 10*3/uL — ABNORMAL LOW (ref 150–400)
RBC: 3.8 MIL/uL — ABNORMAL LOW (ref 4.22–5.81)
RDW: 14.3 % (ref 11.5–15.5)
WBC: 10.2 10*3/uL (ref 4.0–10.5)
nRBC: 0 % (ref 0.0–0.2)

## 2021-12-05 LAB — BASIC METABOLIC PANEL
Anion gap: 9 (ref 5–15)
BUN: 12 mg/dL (ref 6–20)
CO2: 23 mmol/L (ref 22–32)
Calcium: 8.8 mg/dL — ABNORMAL LOW (ref 8.9–10.3)
Chloride: 110 mmol/L (ref 98–111)
Creatinine, Ser: 0.34 mg/dL — ABNORMAL LOW (ref 0.61–1.24)
GFR, Estimated: >60 mL/min (ref >60)
Glucose, Bld: 167 mg/dL — ABNORMAL HIGH (ref 70–99)
Potassium: 3.7 mmol/L (ref 3.5–5.1)
Sodium: 142 mmol/L (ref 135–145)

## 2021-12-05 LAB — PHOSPHORUS: Phosphorus: 3.9 mg/dL (ref 2.5–4.6)

## 2021-12-05 MED ORDER — AMOXICILLIN 875 MG PO TABS: 875.0000 mg | ORAL_TABLET | Freq: Two times a day (BID) | ORAL | Status: AC

## 2021-12-05 MED ORDER — ENOXAPARIN SODIUM 40 MG/0.4ML IJ SOSY
40.0000 mg | PREFILLED_SYRINGE | INTRAMUSCULAR | Status: DC
Start: 2021-12-05 — End: 2023-02-12

## 2021-12-05 MED ORDER — LEVOFLOXACIN 500 MG PO TABS: 500.0000 mg | ORAL_TABLET | Freq: Every day | ORAL | 0 refills | Status: AC

## 2021-12-05 NOTE — Discharge Summary (Signed)
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 cardiopulmonary disease. Electronically Signed   By: Donzetta Kohut M.D.   On: 11/11/2021 13:41    Microbiology: Results for orders placed or performed during the hospital encounter of 12/01/21  Resp Panel by RT-PCR (Flu A&B, Covid) Anterior Nasal Swab     Status: None   Collection Time: 12/01/21  7:35 PM   Specimen: Anterior Nasal Swab  Result Value Ref Range Status   SARS Coronavirus 2 by RT PCR NEGATIVE NEGATIVE Final    Comment: (NOTE) SARS-CoV-2 target nucleic acids are NOT DETECTED.  The SARS-CoV-2 RNA is generally detectable in upper respiratory specimens during the acute phase of infection. The lowest concentration of SARS-CoV-2 viral copies this assay can detect is 138 copies/mL. A negative result does not preclude SARS-Cov-2 infection and should not be used as the sole basis  for treatment or other patient management decisions. A negative result may occur with  improper specimen collection/handling, submission of specimen other than nasopharyngeal swab, presence of viral mutation(s) within the areas targeted by this assay, and inadequate number of viral copies(<138 copies/mL). A negative result must be combined with clinical observations, patient history, and epidemiological information. The expected result is Negative.  Fact Sheet for Patients:  BloggerCourse.com  Fact Sheet for Healthcare Providers:  SeriousBroker.it  This test is no t yet approved or cleared by the [place] FDA and  has been authorized for detection and/or diagnosis of SARS-CoV-2 by FDA under an Emergency Use Authorization (EUA). This EUA will remain  in effect (meaning this test can be used) for the duration of the COVID-19 declaration under Section 564(b)(1) of the Act, 21 U.S.C.section 360bbb-3(b)(1), unless the authorization is terminated  or revoked sooner.       Influenza A by PCR NEGATIVE NEGATIVE Final   Influenza B by PCR NEGATIVE NEGATIVE Final    Comment: (NOTE) The Xpert Xpress SARS-CoV-2/FLU/RSV plus assay is intended as an aid in the diagnosis of influenza from Nasopharyngeal swab specimens and should not be used as a sole basis for treatment. Nasal washings and aspirates are unacceptable for Xpert Xpress SARS-CoV-2/FLU/RSV testing.  Fact Sheet for Patients: BloggerCourse.com  Fact Sheet for Healthcare Providers: SeriousBroker.it  This test is not yet approved or cleared by the [place] FDA and has been authorized for detection and/or diagnosis of SARS-CoV-2 by FDA under an Emergency Use Authorization (EUA). This EUA will remain in effect (meaning this test can be used) for the duration of the COVID-19 declaration under Section 564(b)(1) of the Act, 21  U.S.C. section 360bbb-3(b)(1), unless the authorization is terminated or revoked.  Performed at [facility], [address] [place], [place] [address]   Blood Culture (routine x 2)     Status: None (Preliminary result)   Collection Time: 12/01/21  7:35 PM   Specimen: Right Antecubital; Blood  Result Value Ref Range Status   Specimen Description   Final    RIGHT ANTECUBITAL BOTTLES DRAWN AEROBIC AND ANAEROBIC   Special Requests   Final    Blood Culture results may not be optimal due to an inadequate volume of blood received in culture bottles   Culture   Final    NO GROWTH 4 DAYS Performed at [facility], [address] [place], [place] [address]    Report Status PENDING  Incomplete  Urine Culture     Status: Abnormal  Collection Time: 12/01/21  7:35 PM   Specimen: Urine, Catheterized  Result Value Ref Range Status   Specimen Description   Final    URINE, CATHETERIZED Performed at [facility], [address] [place], [place] [address]    Special Requests   Final    NONE Performed at [facility], [address] [place], [place] [address]    Culture (A)  Final    >=100,000 COLONIES/mL ENTEROCOCCUS FAECALIS 40,000 COLONIES/mL PSEUDOMONAS AERUGINOSA    Report Status 12/04/2021 FINAL  Final   Organism ID, Bacteria ENTEROCOCCUS FAECALIS (A)  Final   Organism ID, Bacteria PSEUDOMONAS AERUGINOSA (A)  Final      Susceptibility   Enterococcus faecalis - MIC*    AMPICILLIN <=2 SENSITIVE Sensitive     NITROFURANTOIN <=16 SENSITIVE Sensitive     VANCOMYCIN 1 SENSITIVE Sensitive     * >=100,000 COLONIES/mL ENTEROCOCCUS FAECALIS   Pseudomonas aeruginosa - MIC*    CEFTAZIDIME 4 SENSITIVE Sensitive     CIPROFLOXACIN <=0.25 SENSITIVE Sensitive     GENTAMICIN <=1 SENSITIVE Sensitive     IMIPENEM 1 SENSITIVE Sensitive     PIP/TAZO 8 SENSITIVE Sensitive     CEFEPIME 2 SENSITIVE Sensitive     * 40,000 COLONIES/mL PSEUDOMONAS AERUGINOSA  Blood Culture (routine x 2)     Status: None  (Preliminary result)   Collection Time: 12/01/21  8:05 PM   Specimen: Left Antecubital; Blood  Result Value Ref Range Status   Specimen Description LEFT ANTECUBITAL  Final   Special Requests   Final    BOTTLES DRAWN AEROBIC AND ANAEROBIC Blood Culture results may not be optimal due to an excessive volume of blood received in culture bottles   Culture   Final    NO GROWTH 4 DAYS Performed at [facility], [address] [place], [place] [address]    Report Status PENDING  Incomplete  MRSA Next Gen by PCR, Nasal     Status: None   Collection Time: 12/02/21  3:15 AM   Specimen: Nasal Mucosa; Nasal Swab  Result Value Ref Range Status   MRSA by PCR Next Gen NOT DETECTED NOT DETECTED Final    Comment: (NOTE) The GeneXpert MRSA Assay (FDA approved for NASAL specimens only), is one component of a comprehensive MRSA colonization surveillance program. It is not intended to diagnose MRSA infection nor to guide or monitor treatment for MRSA infections. Test performance is not FDA approved in patients less than 553 years old. Performed at [facility], [address] [place], [place] [address]     Labs: CBC: Recent Labs  Lab 12/01/21 1935 12/02/21 0449 12/04/21 0342 12/05/21 0146  WBC 18.5* 12.9* 10.4 10.2  NEUTROABS 14.5*  --   --   --   HGB 16.1 13.3 12.2* 11.4*  HCT 52.5* 43.5 40.6 36.5*  MCV 97.0 98.2 97.8 96.1  PLT 247 PLATELET CLUMPS NOTED ON SMEAR, UNABLE TO ESTIMATE 124* 129*   Basic Metabolic Panel: Recent Labs  Lab 12/01/21 1935 12/02/21 0449 12/02/21 0627 12/02/21 1935 12/03/21 0545 12/03/21 1644 12/04/21 0342 12/05/21 0146  NA 148* 145  --   --   --   --  146* 142  K 3.5 3.5  --   --   --   --  3.4* 3.7  CL 118* 116*  --   --   --   --  112* 110  CO2 25 20*  --   --   --   --  25 23  GLUCOSE 290* 153*  --   --   --   --  161* 167*  BUN 23* 14  --   --   --   --  11 12  CREATININE 0.81 0.56*  --   --   --   --  0.41* 0.34*  CALCIUM 9.0 7.5*  --   --    --   --  8.7* 8.8*  MG 2.1  --  1.8 2.3 1.9 1.9  --   --   PHOS  --   --   --  3.0 1.7* 1.8*  --  3.9   Liver Function Tests: Recent Labs  Lab 12/01/21 1935 12/02/21 0449  AST 174* 118*  ALT 326* 244*  ALKPHOS 90 54  BILITOT 0.5 0.9  PROT 7.9 5.2*  ALBUMIN 3.5 2.2*   CBG: Recent Labs  Lab 12/04/21 1612 12/04/21 2026 12/04/21 2342 12/05/21 0409 12/05/21 0733  GLUCAP 134* 129* 178* 143* 156*    Discharge time spent: greater than 30 minutes.  Signed: Tyrone Nine, MD [facility] 12/05/2021

## 2021-12-05 NOTE — Progress Notes (Signed)
Report given to medic. No s/s of distress at this time. Chronic foley intact. Patient off unit via Physicist, medical.

## 2021-12-05 NOTE — Progress Notes (Signed)
Report called to Alona Bene, Charity fundraiser at [facility]. Pt to transfer to facility with chronic foley intact.

## 2021-12-05 NOTE — Consult Note (Addendum)
WOC Nurse Consult Note: Consult requested for right buttock wound. This is noted as a Stage 2 pressure injury, present on admission, in the nursing wound flow sheet.  These can be treated independently by the bedside nurses using the skin care order set and application of foam dressings to protect from further injury. Please re-consult if further assistance is needed.  Thank-you,  Cammie Mcgee MSN, RN, CWOCN, [place], CNS [phone]

## 2021-12-05 NOTE — TOC Transition Note (Signed)
Transition of Care [facility]) - CM/SW Discharge Note   Patient Details  Name: Sean Bruce MRN: [id number] Date of Birth: [birth year]  Transition of Care [facility]) CM/SW Contact:  Carley Hammed, LCSWA Phone Number: 12/05/2021, 2:26 PM   Clinical Narrative:     Pt to be transported to [facility] via [place]. Nurse to call report to [phone]. Rm# [id number]  Final next level of care: Skilled Nursing Facility Barriers to Discharge: Barriers Resolved   Patient Goals and CMS Choice Patient states their goals for this hospitalization and ongoing recovery are:: Pt disoriented and unable to participate in goal setting.      Discharge Placement              Patient chooses bed at:  ([facility]) Patient to be transferred to facility by: [facility] Name of family member notified: [name] Patient and family notified of of transfer: 12/05/21  Discharge Plan and Services     Post Acute Care Choice: Skilled Nursing Facility                               Social Determinants of Health (SDOH) Interventions     Readmission Risk Interventions     No data to display

## 2021-12-05 NOTE — Social Work (Signed)
CSW received a call from APS stating they were signing off of the case, but would have supervisors check on him periodically. CSW was advised to contact [facility] [phone] when pt DC's, CSW left [facility]. CSW spoke with aunt and advised her that CSW had asked almost 30 facilities to review, but none can accept without a payor source. Aunt is seeking guardianship, Has been following Medicaid, and has applied for disability. CSW will provide a list of facilities that may accept once a payor source has been established. Aunt understands that no other facility has been able to accept and that pt will DC back to [facility]. CSW updated Leadership to situation. TOC will be available for any further needs.

## 2021-12-06 LAB — CULTURE, BLOOD (ROUTINE X 2)
Culture: NO GROWTH
Culture: NO GROWTH

## 2021-12-24 ENCOUNTER — Encounter (HOSPITAL_COMMUNITY): Payer: Self-pay | Admitting: *Deleted

## 2021-12-24 ENCOUNTER — Emergency Department (HOSPITAL_COMMUNITY): Payer: Medicaid Other

## 2021-12-24 ENCOUNTER — Other Ambulatory Visit: Payer: Self-pay

## 2021-12-24 ENCOUNTER — Inpatient Hospital Stay (HOSPITAL_COMMUNITY)
Admission: EM | Admit: 2021-12-24 | Discharge: 2021-12-29 | DRG: 698 | Disposition: A | Discharge status: Skilled Nursing Facility | Payer: Medicaid Other | Source: Skilled Nursing Facility | Attending: Internal Medicine | Admitting: Internal Medicine

## 2021-12-24 DIAGNOSIS — L89322 Pressure ulcer of left buttock, stage 2: Secondary | ICD-10-CM | POA: Diagnosis present

## 2021-12-24 DIAGNOSIS — Z794 Long term (current) use of insulin: Secondary | ICD-10-CM

## 2021-12-24 DIAGNOSIS — T83511A Infection and inflammatory reaction due to indwelling urethral catheter, initial encounter: Secondary | ICD-10-CM | POA: Diagnosis not present

## 2021-12-24 DIAGNOSIS — Y846 Urinary catheterization as the cause of abnormal reaction of the patient, or of later complication, without mention of misadventure at the time of the procedure: Secondary | ICD-10-CM | POA: Diagnosis present

## 2021-12-24 DIAGNOSIS — Z931 Gastrostomy status: Secondary | ICD-10-CM

## 2021-12-24 DIAGNOSIS — Z79899 Other long term (current) drug therapy: Secondary | ICD-10-CM | POA: Diagnosis not present

## 2021-12-24 DIAGNOSIS — A419 Sepsis, unspecified organism: Principal | ICD-10-CM

## 2021-12-24 DIAGNOSIS — S065XAS Traumatic subdural hemorrhage with loss of consciousness status unknown, sequela: Secondary | ICD-10-CM

## 2021-12-24 DIAGNOSIS — L89152 Pressure ulcer of sacral region, stage 2: Secondary | ICD-10-CM | POA: Diagnosis present

## 2021-12-24 DIAGNOSIS — G40909 Epilepsy, unspecified, not intractable, without status epilepticus: Secondary | ICD-10-CM | POA: Diagnosis present

## 2021-12-24 DIAGNOSIS — S069XAA Unspecified intracranial injury with loss of consciousness status unknown, initial encounter: Secondary | ICD-10-CM | POA: Diagnosis present

## 2021-12-24 DIAGNOSIS — S06300A Unspecified focal traumatic brain injury without loss of consciousness, initial encounter: Secondary | ICD-10-CM | POA: Diagnosis not present

## 2021-12-24 DIAGNOSIS — Z20822 Contact with and (suspected) exposure to covid-19: Secondary | ICD-10-CM | POA: Diagnosis present

## 2021-12-24 DIAGNOSIS — A4152 Sepsis due to Pseudomonas: Secondary | ICD-10-CM | POA: Diagnosis present

## 2021-12-24 DIAGNOSIS — Z8673 Personal history of transient ischemic attack (TIA), and cerebral infarction without residual deficits: Secondary | ICD-10-CM

## 2021-12-24 DIAGNOSIS — Z978 Presence of other specified devices: Secondary | ICD-10-CM

## 2021-12-24 DIAGNOSIS — J189 Pneumonia, unspecified organism: Secondary | ICD-10-CM | POA: Diagnosis present

## 2021-12-24 DIAGNOSIS — Z7982 Long term (current) use of aspirin: Secondary | ICD-10-CM

## 2021-12-24 DIAGNOSIS — R6510 Systemic inflammatory response syndrome (SIRS) of non-infectious origin without acute organ dysfunction: Secondary | ICD-10-CM | POA: Diagnosis present

## 2021-12-24 DIAGNOSIS — J188 Other pneumonia, unspecified organism: Secondary | ICD-10-CM | POA: Diagnosis not present

## 2021-12-24 DIAGNOSIS — I10 Essential (primary) hypertension: Secondary | ICD-10-CM | POA: Diagnosis present

## 2021-12-24 DIAGNOSIS — N390 Urinary tract infection, site not specified: Secondary | ICD-10-CM | POA: Diagnosis present

## 2021-12-24 DIAGNOSIS — E119 Type 2 diabetes mellitus without complications: Secondary | ICD-10-CM | POA: Diagnosis present

## 2021-12-24 DIAGNOSIS — S069X9D Unspecified intracranial injury with loss of consciousness of unspecified duration, subsequent encounter: Secondary | ICD-10-CM | POA: Diagnosis not present

## 2021-12-24 DIAGNOSIS — N3000 Acute cystitis without hematuria: Secondary | ICD-10-CM

## 2021-12-24 DIAGNOSIS — G40509 Epileptic seizures related to external causes, not intractable, without status epilepticus: Secondary | ICD-10-CM | POA: Diagnosis not present

## 2021-12-24 HISTORY — DX: Unspecified intracranial injury with loss of consciousness status unknown, initial encounter: S06.9XAA

## 2021-12-24 LAB — CBC WITH DIFFERENTIAL/PLATELET
Abs Immature Granulocytes: 0.23 10*3/uL — ABNORMAL HIGH (ref 0.00–0.07)
Basophils Absolute: 0.1 10*3/uL (ref 0.0–0.1)
Basophils Relative: 1 %
Eosinophils Absolute: 0.2 10*3/uL (ref 0.0–0.5)
Eosinophils Relative: 1 %
HCT: 42 % (ref 39.0–52.0)
Hemoglobin: 13.8 g/dL (ref 13.0–17.0)
Immature Granulocytes: 1 %
Lymphocytes Relative: 5 %
Lymphs Abs: 1 10*3/uL (ref 0.7–4.0)
MCH: 29.2 pg (ref 26.0–34.0)
MCHC: 32.9 g/dL (ref 30.0–36.0)
MCV: 89 fL (ref 80.0–100.0)
Monocytes Absolute: 2.2 10*3/uL — ABNORMAL HIGH (ref 0.1–1.0)
Monocytes Relative: 11 %
Neutro Abs: 16.7 10*3/uL — ABNORMAL HIGH (ref 1.7–7.7)
Neutrophils Relative %: 81 %
Platelets: 282 10*3/uL (ref 150–400)
RBC: 4.72 MIL/uL (ref 4.22–5.81)
RDW: 14.7 % (ref 11.5–15.5)
WBC: 20.5 10*3/uL — ABNORMAL HIGH (ref 4.0–10.5)
nRBC: 0 % (ref 0.0–0.2)

## 2021-12-24 LAB — URINALYSIS, MICROSCOPIC (REFLEX)

## 2021-12-24 LAB — URINALYSIS, ROUTINE W REFLEX MICROSCOPIC
Bilirubin Urine: NEGATIVE
Glucose, UA: NEGATIVE mg/dL
Ketones, ur: NEGATIVE mg/dL
Nitrite: POSITIVE — AB
Protein, ur: NEGATIVE mg/dL
Specific Gravity, Urine: 1.01 (ref 1.005–1.030)
pH: 7.5 (ref 5.0–8.0)

## 2021-12-24 LAB — COMPREHENSIVE METABOLIC PANEL
ALT: 66 U/L — ABNORMAL HIGH (ref 0–44)
AST: 31 U/L (ref 15–41)
Albumin: 3.3 g/dL — ABNORMAL LOW (ref 3.5–5.0)
Alkaline Phosphatase: 122 U/L (ref 38–126)
Anion gap: 11 (ref 5–15)
BUN: 11 mg/dL (ref 6–20)
CO2: 25 mmol/L (ref 22–32)
Calcium: 9.4 mg/dL (ref 8.9–10.3)
Chloride: 99 mmol/L (ref 98–111)
Creatinine, Ser: 0.43 mg/dL — ABNORMAL LOW (ref 0.61–1.24)
GFR, Estimated: >60 mL/min (ref >60)
Glucose, Bld: 137 mg/dL — ABNORMAL HIGH (ref 70–99)
Potassium: 3.8 mmol/L (ref 3.5–5.1)
Sodium: 135 mmol/L (ref 135–145)
Total Bilirubin: 0.7 mg/dL (ref 0.3–1.2)
Total Protein: 7.3 g/dL (ref 6.5–8.1)

## 2021-12-24 LAB — RESP PANEL BY RT-PCR (FLU A&B, COVID) ARPGX2
Influenza A by PCR: NEGATIVE
Influenza B by PCR: NEGATIVE
SARS Coronavirus 2 by RT PCR: NEGATIVE

## 2021-12-24 LAB — PROTIME-INR
INR: 1 (ref 0.8–1.2)
Prothrombin Time: 13.4 seconds (ref 11.4–15.2)

## 2021-12-24 LAB — LACTIC ACID, PLASMA
Lactic Acid, Venous: 0.8 mmol/L (ref 0.5–1.9)
Lactic Acid, Venous: 1 mmol/L (ref 0.5–1.9)

## 2021-12-24 MED ORDER — LACTATED RINGERS IV SOLN: INTRAVENOUS | Status: DC

## 2021-12-24 MED ORDER — HYDRALAZINE HCL 20 MG/ML IJ SOLN
20.0000 mg | Freq: Once | INTRAMUSCULAR | Status: AC
  Administered 2021-12-24: 20 mg via INTRAVENOUS
  Filled 2021-12-24: qty 1

## 2021-12-24 MED ORDER — ACETAMINOPHEN 500 MG PO TABS
1000.0000 mg | ORAL_TABLET | Freq: Once | ORAL | Status: AC
  Administered 2021-12-24: 1000 mg
  Filled 2021-12-24: qty 2

## 2021-12-24 MED ORDER — SODIUM CHLORIDE 0.9 % IV SOLN
2.0000 g | Freq: Three times a day (TID) | INTRAVENOUS | Status: DC
  Administered 2021-12-25 – 2021-12-29 (×14): 2 g via INTRAVENOUS
  Filled 2021-12-24 (×14): qty 12.5

## 2021-12-24 MED ORDER — SODIUM CHLORIDE 0.9 % IV SOLN
2.0000 g | Freq: Once | INTRAVENOUS | Status: AC
  Administered 2021-12-24: 2 g via INTRAVENOUS
  Filled 2021-12-24: qty 12.5

## 2021-12-24 MED ORDER — METOPROLOL TARTRATE 5 MG/5ML IV SOLN
5.0000 mg | Freq: Once | INTRAVENOUS | Status: AC
  Administered 2021-12-24: 5 mg via INTRAVENOUS
  Filled 2021-12-24: qty 5

## 2021-12-24 MED ORDER — SODIUM CHLORIDE 0.9 % IV BOLUS (SEPSIS)
1000.0000 mL | Freq: Once | INTRAVENOUS | Status: AC
  Administered 2021-12-24: 1000 mL via INTRAVENOUS
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
## 2021-12-25 DIAGNOSIS — S069X9D Unspecified intracranial injury with loss of consciousness of unspecified duration, subsequent encounter: Secondary | ICD-10-CM

## 2021-12-25 DIAGNOSIS — N3000 Acute cystitis without hematuria: Secondary | ICD-10-CM

## 2021-12-25 DIAGNOSIS — Z794 Long term (current) use of insulin: Secondary | ICD-10-CM

## 2021-12-25 DIAGNOSIS — G40909 Epilepsy, unspecified, not intractable, without status epilepticus: Secondary | ICD-10-CM

## 2021-12-25 DIAGNOSIS — E119 Type 2 diabetes mellitus without complications: Secondary | ICD-10-CM | POA: Diagnosis not present

## 2021-12-25 DIAGNOSIS — J189 Pneumonia, unspecified organism: Secondary | ICD-10-CM

## 2021-12-25 DIAGNOSIS — J188 Other pneumonia, unspecified organism: Secondary | ICD-10-CM

## 2021-12-25 DIAGNOSIS — N390 Urinary tract infection, site not specified: Secondary | ICD-10-CM

## 2021-12-25 DIAGNOSIS — R6510 Systemic inflammatory response syndrome (SIRS) of non-infectious origin without acute organ dysfunction: Secondary | ICD-10-CM | POA: Diagnosis not present

## 2021-12-25 DIAGNOSIS — Z978 Presence of other specified devices: Secondary | ICD-10-CM

## 2021-12-25 LAB — COMPREHENSIVE METABOLIC PANEL
ALT: 56 U/L — ABNORMAL HIGH (ref 0–44)
AST: 29 U/L (ref 15–41)
Albumin: 2.9 g/dL — ABNORMAL LOW (ref 3.5–5.0)
Alkaline Phosphatase: 103 U/L (ref 38–126)
Anion gap: 6 (ref 5–15)
BUN: 8 mg/dL (ref 6–20)
CO2: 24 mmol/L (ref 22–32)
Calcium: 9.2 mg/dL (ref 8.9–10.3)
Chloride: 106 mmol/L (ref 98–111)
Creatinine, Ser: 0.44 mg/dL — ABNORMAL LOW (ref 0.61–1.24)
GFR, Estimated: >60 mL/min (ref >60)
Glucose, Bld: 108 mg/dL — ABNORMAL HIGH (ref 70–99)
Potassium: 3.7 mmol/L (ref 3.5–5.1)
Sodium: 136 mmol/L (ref 135–145)
Total Bilirubin: 0.9 mg/dL (ref 0.3–1.2)
Total Protein: 6.3 g/dL — ABNORMAL LOW (ref 6.5–8.1)

## 2021-12-25 LAB — CBC WITH DIFFERENTIAL/PLATELET
Abs Immature Granulocytes: 0.22 10*3/uL — ABNORMAL HIGH (ref 0.00–0.07)
Basophils Absolute: 0.1 10*3/uL (ref 0.0–0.1)
Basophils Relative: 0 %
Eosinophils Absolute: 0.3 10*3/uL (ref 0.0–0.5)
Eosinophils Relative: 2 %
HCT: 38.1 % — ABNORMAL LOW (ref 39.0–52.0)
Hemoglobin: 12.6 g/dL — ABNORMAL LOW (ref 13.0–17.0)
Immature Granulocytes: 1 %
Lymphocytes Relative: 6 %
Lymphs Abs: 1.1 10*3/uL (ref 0.7–4.0)
MCH: 29.6 pg (ref 26.0–34.0)
MCHC: 33.1 g/dL (ref 30.0–36.0)
MCV: 89.6 fL (ref 80.0–100.0)
Monocytes Absolute: 1.8 10*3/uL — ABNORMAL HIGH (ref 0.1–1.0)
Monocytes Relative: 11 %
Neutro Abs: 13.2 10*3/uL — ABNORMAL HIGH (ref 1.7–7.7)
Neutrophils Relative %: 80 %
Platelets: 244 10*3/uL (ref 150–400)
RBC: 4.25 MIL/uL (ref 4.22–5.81)
RDW: 15.2 % (ref 11.5–15.5)
WBC: 16.7 10*3/uL — ABNORMAL HIGH (ref 4.0–10.5)
nRBC: 0 % (ref 0.0–0.2)

## 2021-12-25 LAB — PROTIME-INR
INR: 1.2 (ref 0.8–1.2)
Prothrombin Time: 15.4 seconds — ABNORMAL HIGH (ref 11.4–15.2)

## 2021-12-25 LAB — GLUCOSE, CAPILLARY
Glucose-Capillary: 112 mg/dL — ABNORMAL HIGH (ref 70–99)
Glucose-Capillary: 110 mg/dL — ABNORMAL HIGH (ref 70–99)
Glucose-Capillary: 124 mg/dL — ABNORMAL HIGH (ref 70–99)
Glucose-Capillary: 130 mg/dL — ABNORMAL HIGH (ref 70–99)

## 2021-12-25 LAB — PROCALCITONIN: Procalcitonin: 0.14 ng/mL

## 2021-12-25 LAB — MAGNESIUM: Magnesium: 1.5 mg/dL — ABNORMAL LOW (ref 1.7–2.4)

## 2021-12-25 LAB — STREP PNEUMONIAE URINARY ANTIGEN: Strep Pneumo Urinary Antigen: NEGATIVE

## 2021-12-25 LAB — CORTISOL-AM, BLOOD: Cortisol - AM: 15.4 ug/dL (ref 6.7–22.6)

## 2021-12-25 MED ORDER — PRO-STAT SUGAR FREE PO LIQD
30.0000 mL | Freq: Every day | ORAL | Status: DC
Start: 2021-12-25 — End: 2021-12-25

## 2021-12-25 MED ORDER — AMANTADINE HCL 100 MG PO CAPS
100.0000 mg | ORAL_CAPSULE | Freq: Two times a day (BID) | ORAL | Status: DC
  Administered 2021-12-25 – 2021-12-29 (×10): 100 mg
  Filled 2021-12-25 (×13): qty 1

## 2021-12-25 MED ORDER — INSULIN ASPART 100 UNIT/ML IJ SOLN: 0.0000 [IU] | Freq: Every day | INTRAMUSCULAR | Status: DC

## 2021-12-25 MED ORDER — LABETALOL HCL 5 MG/ML IV SOLN
10.0000 mg | INTRAVENOUS | Status: DC | PRN
  Administered 2021-12-25 – 2021-12-28 (×8): 10 mg via INTRAVENOUS
  Filled 2021-12-25 (×7): qty 4

## 2021-12-25 MED ORDER — ASPIRIN 325 MG PO TABS
325.0000 mg | ORAL_TABLET | Freq: Every day | ORAL | Status: DC
  Administered 2021-12-25 – 2021-12-29 (×5): 325 mg
  Filled 2021-12-25 (×5): qty 1

## 2021-12-25 MED ORDER — CHLORHEXIDINE GLUCONATE CLOTH 2 % EX PADS
6.0000 | MEDICATED_PAD | Freq: Every day | CUTANEOUS | Status: DC
  Administered 2021-12-25 – 2021-12-29 (×5): 6 via TOPICAL

## 2021-12-25 MED ORDER — ACETAMINOPHEN 650 MG RE SUPP: 650.0000 mg | Freq: Four times a day (QID) | RECTAL | Status: DC | PRN

## 2021-12-25 MED ORDER — PROSOURCE TF PO LIQD
45.0000 mL | Freq: Four times a day (QID) | ORAL | Status: DC
Start: 2021-12-25 — End: 2021-12-29
  Administered 2021-12-25 – 2021-12-29 (×15): 45 mL
  Filled 2021-12-25 (×17): qty 45

## 2021-12-25 MED ORDER — OXYCODONE HCL 5 MG PO TABS
5.0000 mg | ORAL_TABLET | ORAL | Status: DC | PRN
  Administered 2021-12-25 (×3): 5 mg
  Filled 2021-12-25 (×3): qty 1

## 2021-12-25 MED ORDER — OSMOLITE 1.5 CAL PO LIQD
1000.0000 mL | ORAL | Status: DC
  Administered 2021-12-25 – 2021-12-29 (×4): 1000 mL

## 2021-12-25 MED ORDER — POLYETHYLENE GLYCOL 3350 17 G PO PACK
17.0000 g | PACK | Freq: Every day | ORAL | Status: DC
  Administered 2021-12-25 – 2021-12-28 (×4): 17 g
  Filled 2021-12-25 (×4): qty 1

## 2021-12-25 MED ORDER — ONDANSETRON HCL 4 MG PO TABS: 4.0000 mg | ORAL_TABLET | Freq: Four times a day (QID) | ORAL | Status: DC | PRN

## 2021-12-25 MED ORDER — FREE WATER
200.0000 mL | Freq: Four times a day (QID) | Status: DC
  Administered 2021-12-25 – 2021-12-29 (×16): 200 mL

## 2021-12-25 MED ORDER — FREE WATER
100.0000 mL | Freq: Four times a day (QID) | Status: DC
Start: 2021-12-25 — End: 2021-12-25
  Administered 2021-12-25: 100 mL

## 2021-12-25 MED ORDER — METOPROLOL TARTRATE 50 MG PO TABS
100.0000 mg | ORAL_TABLET | Freq: Two times a day (BID) | ORAL | Status: DC
  Administered 2021-12-25 – 2021-12-27 (×6): 100 mg via ORAL
  Filled 2021-12-25 (×6): qty 2

## 2021-12-25 MED ORDER — INSULIN ASPART 100 UNIT/ML IJ SOLN
0.0000 [IU] | Freq: Three times a day (TID) | INTRAMUSCULAR | Status: DC
  Administered 2021-12-25 – 2021-12-28 (×5): 2 [IU] via SUBCUTANEOUS

## 2021-12-25 MED ORDER — ONDANSETRON HCL 4 MG/2ML IJ SOLN: 4.0000 mg | Freq: Four times a day (QID) | INTRAMUSCULAR | Status: DC | PRN

## 2021-12-25 MED ORDER — ENOXAPARIN SODIUM 40 MG/0.4ML IJ SOSY
40.0000 mg | PREFILLED_SYRINGE | INTRAMUSCULAR | Status: DC
Start: 2021-12-25 — End: 2021-12-29
  Administered 2021-12-25 – 2021-12-29 (×5): 40 mg via SUBCUTANEOUS
  Filled 2021-12-25 (×5): qty 0.4

## 2021-12-25 MED ORDER — MAGNESIUM SULFATE 2 GM/50ML IV SOLN
2.0000 g | Freq: Once | INTRAVENOUS | Status: AC
  Administered 2021-12-25: 2 g via INTRAVENOUS
  Filled 2021-12-25: qty 50

## 2021-12-25 MED ORDER — ACETAMINOPHEN 325 MG PO TABS
650.0000 mg | ORAL_TABLET | Freq: Four times a day (QID) | ORAL | Status: DC | PRN
  Administered 2021-12-25: 650 mg
  Filled 2021-12-25: qty 2

## 2021-12-25 MED ORDER — LEVETIRACETAM 100 MG/ML PO SOLN
500.0000 mg | Freq: Two times a day (BID) | ORAL | Status: DC
  Administered 2021-12-25 – 2021-12-29 (×11): 500 mg
  Filled 2021-12-25 (×12): qty 5

## 2021-12-25 MED ORDER — INSULIN DETEMIR 100 UNIT/ML ~~LOC~~ SOLN
5.0000 [IU] | Freq: Every day | SUBCUTANEOUS | Status: DC
Start: 2021-12-25 — End: 2021-12-29
  Administered 2021-12-25 – 2021-12-28 (×4): 5 [IU] via SUBCUTANEOUS
  Filled 2021-12-25 (×6): qty 0.05

## 2021-12-25 MED ORDER — BISACODYL 10 MG RE SUPP
10.0000 mg | Freq: Every day | RECTAL | Status: DC
  Administered 2021-12-25 – 2021-12-27 (×3): 10 mg via RECTAL
  Filled 2021-12-25 (×4): qty 1

## 2021-12-25 MED ORDER — SODIUM CHLORIDE 0.9 % IV SOLN
500.0000 mg | INTRAVENOUS | Status: DC
  Administered 2021-12-25 – 2021-12-29 (×5): 500 mg via INTRAVENOUS
  Filled 2021-12-25 (×5): qty 5
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
## 2021-12-25 NOTE — Assessment & Plan Note (Signed)
Continue Keppra.

## 2021-12-25 NOTE — TOC Progression Note (Signed)
  Transition of Care [facility] Screening Note   Patient Details  Name: Sean Bruce Date of Birth: 01-Jun-1996   Transition of [facility] CM/SW Contact:    Leitha Bleak, RN Phone Number: 12/25/2021, 10:28 AM    Transition of Care Department [facility]) has reviewed patient and no TOC needs have been identified at this time. We will continue to monitor patient advancement through interdisciplinary progression rounds. If new patient transition needs arise, please place a TOC consult.    Expected Discharge Plan: Skilled Nursing Facility Barriers to Discharge: Continued Medical Work up  Expected Discharge Plan and Services Expected Discharge Plan: Skilled Nursing Facility
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
## 2021-12-25 NOTE — Assessment & Plan Note (Signed)
-   Urine culture pending -Continue cefepime -Exchange indwelling Foley

## 2021-12-25 NOTE — Progress Notes (Signed)
Sean Bruce is a 26 y.o. male with medical history significant of TBI, seizure disorder, diabetes mellitus type 2, PEG tube dependent, indwelling Foley, and more presents the ED with a chief complaint of fever, tachycardia, and hypertension.  Patient is not able to provide any history unfortunately.  He was admitted for sepsis, present on admission secondary to UTI along with community-acquired pneumonia.  He was started on azithromycin and cefepime and continues to have some ongoing hypertension and tachycardia.  Cultures are currently pending.  He will be started on tube feedings and IV fluids will be discontinued.  Patient seen and evaluated at bedside and has been admitted after midnight.  Total care time: 25 minutes.

## 2021-12-25 NOTE — Assessment & Plan Note (Signed)
-   100.9, heart rate 150, respiratory rate 23, leukocytosis 20.5 No evidence of endorgan damage -Source = UTI versus community-acquired pneumonia -Covering with cefepime, given the UTI would be hospital-acquired/complicated, and Zithromax -Urine culture pending -Expectorated sputum culture pending -Blood culture pending -Lactic acid normal Continue to monitor

## 2021-12-25 NOTE — Assessment & Plan Note (Signed)
-   Chest x-ray shows patchy airspace disease at the left lung base suspicious for pneumonia.  It also shows previous right lower lobe opacity not well demonstrated by radiograph. -Leukocytosis 20.5, fever 100.9 -Continue cefepime and Zithromax -Urine strep and Legionella -Expectorated sputum culture -Continue to monitor

## 2021-12-25 NOTE — Assessment & Plan Note (Signed)
-   15 units basal insulin at baseline -5 units basal insulin here -Continue tube feeds -Sliding scale coverage -Monitor CBGs

## 2021-12-26 DIAGNOSIS — G40509 Epileptic seizures related to external causes, not intractable, without status epilepticus: Secondary | ICD-10-CM

## 2021-12-26 DIAGNOSIS — S06300A Unspecified focal traumatic brain injury without loss of consciousness, initial encounter: Secondary | ICD-10-CM

## 2021-12-26 DIAGNOSIS — R6510 Systemic inflammatory response syndrome (SIRS) of non-infectious origin without acute organ dysfunction: Secondary | ICD-10-CM | POA: Diagnosis not present

## 2021-12-26 LAB — GLUCOSE, CAPILLARY
Glucose-Capillary: 134 mg/dL — ABNORMAL HIGH (ref 70–99)
Glucose-Capillary: 146 mg/dL — ABNORMAL HIGH (ref 70–99)
Glucose-Capillary: 114 mg/dL — ABNORMAL HIGH (ref 70–99)
Glucose-Capillary: 149 mg/dL — ABNORMAL HIGH (ref 70–99)

## 2021-12-26 LAB — COMPREHENSIVE METABOLIC PANEL
ALT: 43 U/L (ref 0–44)
AST: 19 U/L (ref 15–41)
Albumin: 2.7 g/dL — ABNORMAL LOW (ref 3.5–5.0)
Alkaline Phosphatase: 97 U/L (ref 38–126)
Anion gap: 3 — ABNORMAL LOW (ref 5–15)
BUN: 11 mg/dL (ref 6–20)
CO2: 26 mmol/L (ref 22–32)
Calcium: 8.8 mg/dL — ABNORMAL LOW (ref 8.9–10.3)
Chloride: 111 mmol/L (ref 98–111)
Creatinine, Ser: 0.37 mg/dL — ABNORMAL LOW (ref 0.61–1.24)
GFR, Estimated: >60 mL/min (ref >60)
Glucose, Bld: 124 mg/dL — ABNORMAL HIGH (ref 70–99)
Potassium: 3.6 mmol/L (ref 3.5–5.1)
Sodium: 140 mmol/L (ref 135–145)
Total Bilirubin: 0.2 mg/dL — ABNORMAL LOW (ref 0.3–1.2)
Total Protein: 6.2 g/dL — ABNORMAL LOW (ref 6.5–8.1)

## 2021-12-26 LAB — CBC
HCT: 37.9 % — ABNORMAL LOW (ref 39.0–52.0)
Hemoglobin: 11.9 g/dL — ABNORMAL LOW (ref 13.0–17.0)
MCH: 29 pg (ref 26.0–34.0)
MCHC: 31.4 g/dL (ref 30.0–36.0)
MCV: 92.2 fL (ref 80.0–100.0)
Platelets: 218 10*3/uL (ref 150–400)
RBC: 4.11 MIL/uL — ABNORMAL LOW (ref 4.22–5.81)
RDW: 15.2 % (ref 11.5–15.5)
WBC: 13.2 10*3/uL — ABNORMAL HIGH (ref 4.0–10.5)
nRBC: 0 % (ref 0.0–0.2)

## 2021-12-26 LAB — MAGNESIUM: Magnesium: 1.7 mg/dL (ref 1.7–2.4)

## 2021-12-26 LAB — LEGIONELLA PNEUMOPHILA SEROGP 1 UR AG: L. pneumophila Serogp 1 Ur Ag: NEGATIVE

## 2021-12-26 MED ORDER — LABETALOL HCL 5 MG/ML IV SOLN
0.5000 mg/min | Status: DC
  Filled 2021-12-26: qty 80

## 2021-12-26 NOTE — TOC Initial Note (Addendum)
Transition of Care [facility]) - Initial/Assessment Note    Patient Details  Name: Sean Bruce MRN: [id number] Date of Birth: [birth year]  [facility] CM/SW Contact:    Leitha Bleak, RN Phone Number: 12/26/2021, 3:49 PM  Clinical Narrative:     Patient readmitted from [facility]. Patient is there under LOG, medicaid still pending. TOC spoke with [place]/ Aunt/ and now his legal Guardian, this was approved last Monday.Patient had medicaid in [place] and she was told it is best to cancel that and start the application in [place]. She calls daily trying to get him approved. She would like him moved to another facility due lack of care at facility and two readmissions. TOC also reached out to our Financial advisor for any other suggestions. TOC advised her to continue to work with SW at [facility] and with Social services on [place]. She has an appointment in 2 weeks. TOC to follow.            Addendum : Our Financial team is working on this case as well. Calling medicaid office.   Expected Discharge Plan: Skilled Nursing Facility Barriers to Discharge: Continued Medical Work up   Patient Goals and CMS Choice Patient states their goals for this hospitalization and ongoing recovery are:: Legal Guardian seeking new placement and working on [employer] application. CMS Medicare.gov Compare Post Acute Care list provided to:: Patient Represenative (must comment) Choice offered to / list presented to : [facility] POA / Guardian  Expected Discharge Plan and Services Expected Discharge Plan: Skilled Nursing Facility       Prior Living Arrangements/Services   Lives with:: Facility Resident           Activities of Daily Living Home Assistive Devices/Equipment: Other (Comment) (from SNF; total dependent care) ADL Screening (condition at time of admission) Patient's cognitive ability adequate to safely complete daily activities?: No Is the patient deaf or have difficulty hearing?: No Does the  patient have difficulty seeing, even when wearing glasses/contacts?: No Does the patient have difficulty concentrating, remembering, or making decisions?: Yes Patient able to express need for assistance with ADLs?: No Does the patient have difficulty dressing or bathing?: Yes (total dependent care) Independently performs ADLs?: No Communication: Dependent Is this a change from baseline?: Pre-admission baseline Dressing (OT): Dependent Is this a change from baseline?: Pre-admission baseline Grooming: Dependent Is this a change from baseline?: Pre-admission baseline Feeding: Dependent (peg tube feeding) Is this a change from baseline?: Pre-admission baseline Bathing: Dependent (total dependent care) Is this a change from baseline?: Pre-admission baseline Toileting: Dependent Is this a change from baseline?: Pre-admission baseline In/Out Bed: Dependent (quadriplegic) Is this a change from baseline?: Pre-admission baseline Walks in Home: Dependent Is this a change from baseline?: Pre-admission baseline Does the patient have difficulty walking or climbing stairs?: Yes Weakness of Legs: Both Weakness of Arms/Hands: Both  Permission Sought/Granted Permission sought to share information with : Case Manager    Share Information with NAME: [name]     Permission granted to share info w Relationship: Aunt/ Legal Guardian     Emotional Assessment   Attitude/Demeanor/Rapport: Unable to Assess Affect (typically observed): Unable to Assess   Alcohol / Substance Use: Not Applicable Psych Involvement: No (comment)  Admission diagnosis:  SIRS (systemic inflammatory response syndrome) (HCC) [R65.10] Acute cystitis without hematuria [N30.00] Pneumonia of left lower lobe due to infectious organism [J18.9] Sepsis without acute organ dysfunction, due to unspecified organism [facility]) [A41.9] Patient Active Problem List   Diagnosis Date Noted  Diabetes mellitus type 2 in nonobese (HCC)  12/25/2021   UTI (urinary tract infection) 12/25/2021   CAP (community acquired pneumonia) 12/25/2021   Seizure disorder (HCC) 12/25/2021   SIRS (systemic inflammatory response syndrome) (HCC) 12/24/2021   Sepsis (HCC) 12/02/2021   Pressure injury of skin 12/02/2021   Palliative care by specialist    Subdural hematoma (HCC) 09/26/2021   Traumatic brain injury (HCC) 09/25/2021   PCP:  Pcp, No Pharmacy:  No Pharmacies Listed   Readmission Risk Interventions    12/26/2021    3:35 PM  Readmission Risk Prevention Plan  Transportation Screening Complete  PCP or Specialist Appt within 5-7 Days Not Complete  Home Care Screening Complete  Medication Review (RN CM) Complete

## 2021-12-26 NOTE — Plan of Care (Signed)
  Problem: Education: Goal: Knowledge of General Education information will improve Description: Including pain rating scale, medication(s)/side effects and non-pharmacologic comfort measures Outcome: Progressing   Problem: Health Behavior/Discharge Planning: Goal: Ability to manage health-related needs will improve Outcome: Progressing   Problem: Clinical Measurements: Goal: Ability to maintain clinical measurements within normal limits will improve Outcome: Progressing Goal: Will remain free from infection Outcome: Progressing Goal: Diagnostic test results will improve Outcome: Progressing Goal: Respiratory complications will improve Outcome: Progressing Goal: Cardiovascular complication will be avoided Outcome: Progressing   Problem: Activity: Goal: Risk for activity intolerance will decrease Outcome: Progressing   Problem: Nutrition: Goal: Adequate nutrition will be maintained Outcome: Progressing   Problem: Coping: Goal: Level of anxiety will decrease Outcome: Progressing   Problem: Elimination: Goal: Will not experience complications related to bowel motility Outcome: Progressing Goal: Will not experience complications related to urinary retention Outcome: Progressing   Problem: Pain Managment: Goal: General experience of comfort will improve Outcome: Progressing   Problem: Safety: Goal: Ability to remain free from injury will improve Outcome: Progressing   Problem: Skin Integrity: Goal: Risk for impaired skin integrity will decrease Outcome: Progressing   Problem: Education: Goal: Ability to describe self-care measures that may prevent or decrease complications (Diabetes Survival Skills Education) will improve Outcome: Progressing Goal: Individualized Educational Video(s) Outcome: Progressing   Problem: Coping: Goal: Ability to adjust to condition or change in health will improve Outcome: Progressing   Problem: Fluid Volume: Goal: Ability to  maintain a balanced intake and output will improve Outcome: Progressing   Problem: Health Behavior/Discharge Planning: Goal: Ability to identify and utilize available resources and services will improve Outcome: Progressing Goal: Ability to manage health-related needs will improve Outcome: Progressing   Problem: Metabolic: Goal: Ability to maintain appropriate glucose levels will improve Outcome: Progressing   Problem: Nutritional: Goal: Maintenance of adequate nutrition will improve Outcome: Progressing Goal: Progress toward achieving an optimal weight will improve Outcome: Progressing   Problem: Skin Integrity: Goal: Risk for impaired skin integrity will decrease Outcome: Progressing   Problem: Tissue Perfusion: Goal: Adequacy of tissue perfusion will improve Outcome: Progressing   Problem: Fluid Volume: Goal: Hemodynamic stability will improve Outcome: Progressing   Problem: Clinical Measurements: Goal: Diagnostic test results will improve Outcome: Progressing Goal: Signs and symptoms of infection will decrease Outcome: Progressing   Problem: Respiratory: Goal: Ability to maintain adequate ventilation will improve Outcome: Progressing   

## 2021-12-26 NOTE — Progress Notes (Signed)
Pharmacy Antibiotic Note  Sean Bruce is a 26 y.o. male admitted on 12/24/2021 with UTI/sepsis  Pharmacy has been consulted for Cefepime dosing. Sepsis: Source UTI versus CAP.  PCT 0.14 Plan: Continue Cefepime 2gm IV q8h F/U cxs and clinical progress Monitor V/S and labs  Height: 6' (182.9 cm) Weight: 81.2 kg (179 lb 0.2 oz) IBW/kg (Calculated) : 77.6  Temp (24hrs), Avg:99.6 F (37.6 C), Min:99.1 F (37.3 C), Max:100.2 F (37.9 C)  Recent Labs  Lab 12/24/21 1847 12/24/21 2054 12/25/21 0455 12/26/21 0316  WBC 20.5*  --  16.7* 13.2*  CREATININE 0.43*  --  0.44* 0.37*  LATICACIDVEN 0.8 1.0  --   --     Estimated Creatinine Clearance: 152.2 mL/min (A) (by C-G formula based on SCr of 0.37 mg/dL (L)).    No Known Allergies  Antimicrobials this admission: Cefepime  6/28 >>  azithromycin 6/28 >>   Microbiology results: 6/28 BCx: ngtd 6/28 UCx: Pseudomonas A. >  100K CFU/ml  Thank you for allowing pharmacy to be a part of this patient's care.  Elder Cyphers, BS Pharm D, BCPS Clinical Pharmacist 12/26/2021 3:15 PM
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
## 2021-12-27 DIAGNOSIS — R6510 Systemic inflammatory response syndrome (SIRS) of non-infectious origin without acute organ dysfunction: Secondary | ICD-10-CM | POA: Diagnosis not present

## 2021-12-27 LAB — CBC
HCT: 35.6 % — ABNORMAL LOW (ref 39.0–52.0)
Hemoglobin: 11 g/dL — ABNORMAL LOW (ref 13.0–17.0)
MCH: 28.9 pg (ref 26.0–34.0)
MCHC: 30.9 g/dL (ref 30.0–36.0)
MCV: 93.4 fL (ref 80.0–100.0)
Platelets: 218 10*3/uL (ref 150–400)
RBC: 3.81 MIL/uL — ABNORMAL LOW (ref 4.22–5.81)
RDW: 15.1 % (ref 11.5–15.5)
WBC: 11.6 10*3/uL — ABNORMAL HIGH (ref 4.0–10.5)
nRBC: 0 % (ref 0.0–0.2)

## 2021-12-27 LAB — BASIC METABOLIC PANEL
Anion gap: 6 (ref 5–15)
BUN: 10 mg/dL (ref 6–20)
CO2: 25 mmol/L (ref 22–32)
Calcium: 9 mg/dL (ref 8.9–10.3)
Chloride: 108 mmol/L (ref 98–111)
Creatinine, Ser: 0.37 mg/dL — ABNORMAL LOW (ref 0.61–1.24)
GFR, Estimated: >60 mL/min (ref >60)
Glucose, Bld: 124 mg/dL — ABNORMAL HIGH (ref 70–99)
Potassium: 4 mmol/L (ref 3.5–5.1)
Sodium: 139 mmol/L (ref 135–145)

## 2021-12-27 LAB — URINE CULTURE
Culture: 20000 — AB
Culture: >=100000 — AB

## 2021-12-27 LAB — GLUCOSE, CAPILLARY
Glucose-Capillary: 144 mg/dL — ABNORMAL HIGH (ref 70–99)
Glucose-Capillary: 118 mg/dL — ABNORMAL HIGH (ref 70–99)
Glucose-Capillary: 95 mg/dL (ref 70–99)
Glucose-Capillary: 124 mg/dL — ABNORMAL HIGH (ref 70–99)

## 2021-12-27 LAB — MAGNESIUM: Magnesium: 1.7 mg/dL (ref 1.7–2.4)

## 2021-12-27 MED ORDER — CARVEDILOL 12.5 MG PO TABS
25.0000 mg | ORAL_TABLET | Freq: Two times a day (BID) | ORAL | Status: DC
  Filled 2021-12-27: qty 2

## 2021-12-27 MED ORDER — CARVEDILOL 12.5 MG PO TABS
25.0000 mg | ORAL_TABLET | Freq: Two times a day (BID) | ORAL | Status: DC
Start: 2021-12-27 — End: 2021-12-29
  Administered 2021-12-27 – 2021-12-29 (×4): 25 mg
  Filled 2021-12-27 (×4): qty 2
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
## 2021-12-27 NOTE — Plan of Care (Signed)
  Problem: Education: Goal: Knowledge of General Education information will improve Description: Including pain rating scale, medication(s)/side effects and non-pharmacologic comfort measures Outcome: Progressing   Problem: Health Behavior/Discharge Planning: Goal: Ability to manage health-related needs will improve Outcome: Progressing   Problem: Clinical Measurements: Goal: Ability to maintain clinical measurements within normal limits will improve Outcome: Progressing Goal: Will remain free from infection Outcome: Progressing Goal: Diagnostic test results will improve Outcome: Progressing Goal: Respiratory complications will improve Outcome: Progressing Goal: Cardiovascular complication will be avoided Outcome: Progressing   Problem: Activity: Goal: Risk for activity intolerance will decrease Outcome: Progressing   Problem: Nutrition: Goal: Adequate nutrition will be maintained Outcome: Progressing   Problem: Coping: Goal: Level of anxiety will decrease Outcome: Progressing   Problem: Elimination: Goal: Will not experience complications related to bowel motility Outcome: Progressing Goal: Will not experience complications related to urinary retention Outcome: Progressing   Problem: Pain Managment: Goal: General experience of comfort will improve Outcome: Progressing   Problem: Safety: Goal: Ability to remain free from injury will improve Outcome: Progressing   Problem: Skin Integrity: Goal: Risk for impaired skin integrity will decrease Outcome: Progressing   Problem: Education: Goal: Ability to describe self-care measures that may prevent or decrease complications (Diabetes Survival Skills Education) will improve Outcome: Progressing Goal: Individualized Educational Video(s) Outcome: Progressing   Problem: Coping: Goal: Ability to adjust to condition or change in health will improve Outcome: Progressing   Problem: Fluid Volume: Goal: Ability to  maintain a balanced intake and output will improve Outcome: Progressing   Problem: Health Behavior/Discharge Planning: Goal: Ability to identify and utilize available resources and services will improve Outcome: Progressing Goal: Ability to manage health-related needs will improve Outcome: Progressing   Problem: Metabolic: Goal: Ability to maintain appropriate glucose levels will improve Outcome: Progressing   Problem: Nutritional: Goal: Maintenance of adequate nutrition will improve Outcome: Progressing Goal: Progress toward achieving an optimal weight will improve Outcome: Progressing   Problem: Skin Integrity: Goal: Risk for impaired skin integrity will decrease Outcome: Progressing   Problem: Tissue Perfusion: Goal: Adequacy of tissue perfusion will improve Outcome: Progressing   Problem: Fluid Volume: Goal: Hemodynamic stability will improve Outcome: Progressing   Problem: Clinical Measurements: Goal: Diagnostic test results will improve Outcome: Progressing Goal: Signs and symptoms of infection will decrease Outcome: Progressing   Problem: Respiratory: Goal: Ability to maintain adequate ventilation will improve Outcome: Progressing   

## 2021-12-28 DIAGNOSIS — R6510 Systemic inflammatory response syndrome (SIRS) of non-infectious origin without acute organ dysfunction: Secondary | ICD-10-CM | POA: Diagnosis not present

## 2021-12-28 LAB — BASIC METABOLIC PANEL
Anion gap: 6 (ref 5–15)
BUN: 12 mg/dL (ref 6–20)
CO2: 26 mmol/L (ref 22–32)
Calcium: 9.3 mg/dL (ref 8.9–10.3)
Chloride: 105 mmol/L (ref 98–111)
Creatinine, Ser: 0.32 mg/dL — ABNORMAL LOW (ref 0.61–1.24)
GFR, Estimated: >60 mL/min (ref >60)
Glucose, Bld: 135 mg/dL — ABNORMAL HIGH (ref 70–99)
Potassium: 4.1 mmol/L (ref 3.5–5.1)
Sodium: 137 mmol/L (ref 135–145)

## 2021-12-28 LAB — CBC
HCT: 37.9 % — ABNORMAL LOW (ref 39.0–52.0)
Hemoglobin: 11.8 g/dL — ABNORMAL LOW (ref 13.0–17.0)
MCH: 28.6 pg (ref 26.0–34.0)
MCHC: 31.1 g/dL (ref 30.0–36.0)
MCV: 92 fL (ref 80.0–100.0)
Platelets: 231 10*3/uL (ref 150–400)
RBC: 4.12 MIL/uL — ABNORMAL LOW (ref 4.22–5.81)
RDW: 14.8 % (ref 11.5–15.5)
WBC: 11.7 10*3/uL — ABNORMAL HIGH (ref 4.0–10.5)
nRBC: 0 % (ref 0.0–0.2)

## 2021-12-28 LAB — GLUCOSE, CAPILLARY
Glucose-Capillary: 118 mg/dL — ABNORMAL HIGH (ref 70–99)
Glucose-Capillary: 137 mg/dL — ABNORMAL HIGH (ref 70–99)
Glucose-Capillary: 110 mg/dL — ABNORMAL HIGH (ref 70–99)
Glucose-Capillary: 100 mg/dL — ABNORMAL HIGH (ref 70–99)

## 2021-12-28 LAB — MAGNESIUM: Magnesium: 1.6 mg/dL — ABNORMAL LOW (ref 1.7–2.4)

## 2021-12-28 MED ORDER — MAGNESIUM SULFATE 2 GM/50ML IV SOLN
2.0000 g | Freq: Once | INTRAVENOUS | Status: AC
Start: 2021-12-28 — End: 2021-12-28
  Administered 2021-12-28: 2 g via INTRAVENOUS
  Filled 2021-12-28: qty 50

## 2021-12-28 MED ORDER — CLONIDINE HCL 0.1 MG PO TABS
0.1000 mg | ORAL_TABLET | Freq: Three times a day (TID) | ORAL | Status: DC
  Administered 2021-12-28 – 2021-12-29 (×4): 0.1 mg via ORAL
  Filled 2021-12-28 (×4): qty 1

## 2021-12-28 NOTE — Plan of Care (Signed)
  Problem: Education: Goal: Knowledge of General Education information will improve Description: Including pain rating scale, medication(s)/side effects and non-pharmacologic comfort measures Outcome: Progressing   Problem: Health Behavior/Discharge Planning: Goal: Ability to manage health-related needs will improve Outcome: Progressing   Problem: Clinical Measurements: Goal: Ability to maintain clinical measurements within normal limits will improve Outcome: Progressing Goal: Will remain free from infection Outcome: Progressing Goal: Diagnostic test results will improve Outcome: Progressing Goal: Respiratory complications will improve Outcome: Progressing Goal: Cardiovascular complication will be avoided Outcome: Progressing   Problem: Activity: Goal: Risk for activity intolerance will decrease Outcome: Progressing   Problem: Nutrition: Goal: Adequate nutrition will be maintained Outcome: Progressing   Problem: Coping: Goal: Level of anxiety will decrease Outcome: Progressing   Problem: Elimination: Goal: Will not experience complications related to bowel motility Outcome: Progressing Goal: Will not experience complications related to urinary retention Outcome: Progressing   Problem: Pain Managment: Goal: General experience of comfort will improve Outcome: Progressing   Problem: Safety: Goal: Ability to remain free from injury will improve Outcome: Progressing   Problem: Skin Integrity: Goal: Risk for impaired skin integrity will decrease Outcome: Progressing   Problem: Education: Goal: Ability to describe self-care measures that may prevent or decrease complications (Diabetes Survival Skills Education) will improve Outcome: Progressing Goal: Individualized Educational Video(s) Outcome: Progressing   Problem: Coping: Goal: Ability to adjust to condition or change in health will improve Outcome: Progressing   Problem: Fluid Volume: Goal: Ability to  maintain a balanced intake and output will improve Outcome: Progressing   Problem: Health Behavior/Discharge Planning: Goal: Ability to identify and utilize available resources and services will improve Outcome: Progressing Goal: Ability to manage health-related needs will improve Outcome: Progressing   Problem: Metabolic: Goal: Ability to maintain appropriate glucose levels will improve Outcome: Progressing   Problem: Nutritional: Goal: Maintenance of adequate nutrition will improve Outcome: Progressing Goal: Progress toward achieving an optimal weight will improve Outcome: Progressing   Problem: Skin Integrity: Goal: Risk for impaired skin integrity will decrease Outcome: Progressing   Problem: Tissue Perfusion: Goal: Adequacy of tissue perfusion will improve Outcome: Progressing   Problem: Fluid Volume: Goal: Hemodynamic stability will improve Outcome: Progressing   Problem: Clinical Measurements: Goal: Diagnostic test results will improve Outcome: Progressing Goal: Signs and symptoms of infection will decrease Outcome: Progressing   Problem: Respiratory: Goal: Ability to maintain adequate ventilation will improve Outcome: Progressing   
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
## 2021-12-29 DIAGNOSIS — R6510 Systemic inflammatory response syndrome (SIRS) of non-infectious origin without acute organ dysfunction: Secondary | ICD-10-CM | POA: Diagnosis not present

## 2021-12-29 LAB — CBC
HCT: 37.4 % — ABNORMAL LOW (ref 39.0–52.0)
Hemoglobin: 11.8 g/dL — ABNORMAL LOW (ref 13.0–17.0)
MCH: 29.1 pg (ref 26.0–34.0)
MCHC: 31.6 g/dL (ref 30.0–36.0)
MCV: 92.3 fL (ref 80.0–100.0)
Platelets: 224 10*3/uL (ref 150–400)
RBC: 4.05 MIL/uL — ABNORMAL LOW (ref 4.22–5.81)
RDW: 14.9 % (ref 11.5–15.5)
WBC: 12.1 10*3/uL — ABNORMAL HIGH (ref 4.0–10.5)
nRBC: 0 % (ref 0.0–0.2)

## 2021-12-29 LAB — BASIC METABOLIC PANEL
Anion gap: 7 (ref 5–15)
BUN: 13 mg/dL (ref 6–20)
CO2: 27 mmol/L (ref 22–32)
Calcium: 9.4 mg/dL (ref 8.9–10.3)
Chloride: 105 mmol/L (ref 98–111)
Creatinine, Ser: 0.3 mg/dL — ABNORMAL LOW (ref 0.61–1.24)
GFR, Estimated: >60 mL/min (ref >60)
Glucose, Bld: 98 mg/dL (ref 70–99)
Potassium: 4.2 mmol/L (ref 3.5–5.1)
Sodium: 139 mmol/L (ref 135–145)

## 2021-12-29 LAB — CULTURE, BLOOD (ROUTINE X 2)
Culture: NO GROWTH
Culture: NO GROWTH

## 2021-12-29 LAB — MAGNESIUM: Magnesium: 1.8 mg/dL (ref 1.7–2.4)

## 2021-12-29 LAB — GLUCOSE, CAPILLARY
Glucose-Capillary: 109 mg/dL — ABNORMAL HIGH (ref 70–99)
Glucose-Capillary: 98 mg/dL (ref 70–99)

## 2021-12-29 MED ORDER — CLONIDINE HCL 0.1 MG PO TABS: 0.1000 mg | ORAL_TABLET | Freq: Three times a day (TID) | ORAL | 11 refills | Status: DC

## 2021-12-29 MED ORDER — CARVEDILOL 25 MG PO TABS: 25.0000 mg | ORAL_TABLET | Freq: Two times a day (BID) | ORAL | 0 refills | Status: AC

## 2021-12-29 NOTE — Progress Notes (Addendum)
CSW spoke with Eunice Blase at [facility] who states the patient can return.  CSW called [facility] to add patient to transport list.  CSW sent discharge summary to [facility] for review.  The number to call for report is [phone]. Patient will go to room C29, bed #2.  Edwin Dada, MSW, LCSW Transitions of Care  Clinical Social Worker II [phone]

## 2021-12-29 NOTE — Discharge Summary (Signed)
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 Abnormal   Collection Time: 12/24/21  9:00 PM  Specimen: Urine, Clean Catch  Result Value Ref Range Status   Specimen Description   Final    URINE, CLEAN CATCH Performed at [facility], [address] [place], [place] [address]    Special Requests   Final    NONE Performed at [facility], [address], [place], [place] [address]    Culture (A)  Final    20,000 COLONIES/mL PSEUDOMONAS AERUGINOSA SUSCEPTIBILITIES PERFORMED ON PREVIOUS CULTURE WITHIN THE LAST 5 DAYS. Performed at [facility], [address], [place], [place] [address]    Report Status 12/27/2021 FINAL  Final  Remove and replace urinary cath (placed > 5 days) then obtain urine culture from new indwelling urinary catheter.     Status: Abnormal   Collection Time: 12/25/21  2:36 AM   Specimen: Urine, Catheterized  Result Value Ref Range Status   Specimen Description   Final    URINE, CATHETERIZED Performed at [facility], [address] [place], [place] [address]    Special Requests   Final    NONE Performed at [facility], [address] [place], [place] [address]    Culture >=100,000 COLONIES/mL PSEUDOMONAS AERUGINOSA (A)  Final   Report Status 12/27/2021 FINAL  Final   Organism ID, Bacteria PSEUDOMONAS AERUGINOSA (A)  Final      Susceptibility   Pseudomonas aeruginosa - MIC*    CEFTAZIDIME <=1 SENSITIVE Sensitive     CIPROFLOXACIN 1 INTERMEDIATE Intermediate     GENTAMICIN <=1 SENSITIVE Sensitive     IMIPENEM 2 SENSITIVE Sensitive     PIP/TAZO <=4 SENSITIVE Sensitive     CEFEPIME 0.5 SENSITIVE Sensitive     * >=100,000 COLONIES/mL PSEUDOMONAS AERUGINOSA     Time coordinating discharge: 35 minutes  SIGNED:   Erick Blinks, DO [facility] 12/29/2021, 8:55 AM  If 7PM-7AM, please contact night-coverage [email or web address]

## 2021-12-29 NOTE — Progress Notes (Signed)
Sean Bruce to be D/C'd to [facility] per MD order. Report called to [place] at [facility].  Skin clean and dry with Stage 2 on bottom. IV catheters discontinued intact. Site without signs and symptoms of complications. Dressing and pressure applied. PEG tube intact.  An After Visit Summary was printed and given to transport. Legal guardian Angelo Prindle notified. Patient escorted via stretcher.  Jon Gills  12/29/2021

## 2022-01-22 ENCOUNTER — Inpatient Hospital Stay (HOSPITAL_COMMUNITY)
Admission: EM | Admit: 2022-01-22 | Discharge: 2022-01-26 | DRG: 698 | Disposition: A | Discharge status: Skilled Nursing Facility | Payer: Medicaid Other | Source: Skilled Nursing Facility | Attending: Internal Medicine | Admitting: Internal Medicine

## 2022-01-22 ENCOUNTER — Emergency Department (HOSPITAL_COMMUNITY): Payer: Medicaid Other

## 2022-01-22 DIAGNOSIS — R000 Tachycardia, unspecified: Secondary | ICD-10-CM | POA: Diagnosis not present

## 2022-01-22 DIAGNOSIS — A4102 Sepsis due to Methicillin resistant Staphylococcus aureus: Secondary | ICD-10-CM | POA: Diagnosis present

## 2022-01-22 DIAGNOSIS — T83510D Infection and inflammatory reaction due to cystostomy catheter, subsequent encounter: Secondary | ICD-10-CM | POA: Diagnosis not present

## 2022-01-22 DIAGNOSIS — G40909 Epilepsy, unspecified, not intractable, without status epilepticus: Secondary | ICD-10-CM

## 2022-01-22 DIAGNOSIS — Z87440 Personal history of urinary (tract) infections: Secondary | ICD-10-CM | POA: Diagnosis not present

## 2022-01-22 DIAGNOSIS — Z930 Tracheostomy status: Secondary | ICD-10-CM | POA: Diagnosis not present

## 2022-01-22 DIAGNOSIS — Z87820 Personal history of traumatic brain injury: Secondary | ICD-10-CM

## 2022-01-22 DIAGNOSIS — Z20822 Contact with and (suspected) exposure to covid-19: Secondary | ICD-10-CM | POA: Diagnosis present

## 2022-01-22 DIAGNOSIS — Z978 Presence of other specified devices: Secondary | ICD-10-CM

## 2022-01-22 DIAGNOSIS — E119 Type 2 diabetes mellitus without complications: Secondary | ICD-10-CM

## 2022-01-22 DIAGNOSIS — Z794 Long term (current) use of insulin: Secondary | ICD-10-CM

## 2022-01-22 DIAGNOSIS — Z79899 Other long term (current) drug therapy: Secondary | ICD-10-CM | POA: Diagnosis not present

## 2022-01-22 DIAGNOSIS — S069X0S Unspecified intracranial injury without loss of consciousness, sequela: Secondary | ICD-10-CM | POA: Diagnosis not present

## 2022-01-22 DIAGNOSIS — R8281 Pyuria: Secondary | ICD-10-CM

## 2022-01-22 DIAGNOSIS — T83511A Infection and inflammatory reaction due to indwelling urethral catheter, initial encounter: Secondary | ICD-10-CM | POA: Diagnosis not present

## 2022-01-22 DIAGNOSIS — N390 Urinary tract infection, site not specified: Secondary | ICD-10-CM | POA: Diagnosis present

## 2022-01-22 DIAGNOSIS — A419 Sepsis, unspecified organism: Secondary | ICD-10-CM | POA: Diagnosis present

## 2022-01-22 DIAGNOSIS — K5900 Constipation, unspecified: Secondary | ICD-10-CM | POA: Diagnosis present

## 2022-01-22 DIAGNOSIS — Z7982 Long term (current) use of aspirin: Secondary | ICD-10-CM | POA: Diagnosis not present

## 2022-01-22 DIAGNOSIS — Z931 Gastrostomy status: Secondary | ICD-10-CM

## 2022-01-22 DIAGNOSIS — R532 Functional quadriplegia: Secondary | ICD-10-CM | POA: Diagnosis present

## 2022-01-22 DIAGNOSIS — L89322 Pressure ulcer of left buttock, stage 2: Secondary | ICD-10-CM | POA: Diagnosis present

## 2022-01-22 DIAGNOSIS — Y846 Urinary catheterization as the cause of abnormal reaction of the patient, or of later complication, without mention of misadventure at the time of the procedure: Secondary | ICD-10-CM | POA: Diagnosis present

## 2022-01-22 DIAGNOSIS — I10 Essential (primary) hypertension: Secondary | ICD-10-CM

## 2022-01-22 DIAGNOSIS — S069XAA Unspecified intracranial injury with loss of consciousness status unknown, initial encounter: Secondary | ICD-10-CM

## 2022-01-22 DIAGNOSIS — L89152 Pressure ulcer of sacral region, stage 2: Secondary | ICD-10-CM | POA: Diagnosis present

## 2022-01-22 LAB — PROTIME-INR
INR: 1 (ref 0.8–1.2)
Prothrombin Time: 13.6 seconds (ref 11.4–15.2)

## 2022-01-22 LAB — RESP PANEL BY RT-PCR (FLU A&B, COVID) ARPGX2
Influenza A by PCR: NEGATIVE
Influenza B by PCR: NEGATIVE
SARS Coronavirus 2 by RT PCR: NEGATIVE

## 2022-01-22 LAB — LACTIC ACID, PLASMA
Lactic Acid, Venous: 1 mmol/L (ref 0.5–1.9)
Lactic Acid, Venous: 0.9 mmol/L (ref 0.5–1.9)

## 2022-01-22 LAB — CBC WITH DIFFERENTIAL/PLATELET
Abs Immature Granulocytes: 0.18 10*3/uL — ABNORMAL HIGH (ref 0.00–0.07)
Basophils Absolute: 0.1 10*3/uL (ref 0.0–0.1)
Basophils Relative: 1 %
Eosinophils Absolute: 0.7 10*3/uL — ABNORMAL HIGH (ref 0.0–0.5)
Eosinophils Relative: 5 %
HCT: 44.2 % (ref 39.0–52.0)
Hemoglobin: 14.4 g/dL (ref 13.0–17.0)
Immature Granulocytes: 1 %
Lymphocytes Relative: 9 %
Lymphs Abs: 1.5 10*3/uL (ref 0.7–4.0)
MCH: 29.3 pg (ref 26.0–34.0)
MCHC: 32.6 g/dL (ref 30.0–36.0)
MCV: 89.8 fL (ref 80.0–100.0)
Monocytes Absolute: 2.1 10*3/uL — ABNORMAL HIGH (ref 0.1–1.0)
Monocytes Relative: 13 %
Neutro Abs: 11.5 10*3/uL — ABNORMAL HIGH (ref 1.7–7.7)
Neutrophils Relative %: 71 %
Platelets: 253 10*3/uL (ref 150–400)
RBC: 4.92 MIL/uL (ref 4.22–5.81)
RDW: 15.1 % (ref 11.5–15.5)
WBC: 16.1 10*3/uL — ABNORMAL HIGH (ref 4.0–10.5)
nRBC: 0 % (ref 0.0–0.2)

## 2022-01-22 LAB — URINALYSIS, ROUTINE W REFLEX MICROSCOPIC
Bacteria, UA: NONE SEEN
Bilirubin Urine: NEGATIVE
Glucose, UA: NEGATIVE mg/dL
Ketones, ur: NEGATIVE mg/dL
Nitrite: NEGATIVE
Protein, ur: 100 mg/dL — AB
RBC / HPF: >50 RBC/hpf — ABNORMAL HIGH (ref 0–5)
Specific Gravity, Urine: 1.02 (ref 1.005–1.030)
WBC, UA: >50 WBC/hpf — ABNORMAL HIGH (ref 0–5)
pH: 6 (ref 5.0–8.0)

## 2022-01-22 LAB — COMPREHENSIVE METABOLIC PANEL
ALT: 39 U/L (ref 0–44)
AST: 21 U/L (ref 15–41)
Albumin: 3.9 g/dL (ref 3.5–5.0)
Alkaline Phosphatase: 113 U/L (ref 38–126)
Anion gap: 9 (ref 5–15)
BUN: 16 mg/dL (ref 6–20)
CO2: 24 mmol/L (ref 22–32)
Calcium: 9.8 mg/dL (ref 8.9–10.3)
Chloride: 105 mmol/L (ref 98–111)
Creatinine, Ser: 0.51 mg/dL — ABNORMAL LOW (ref 0.61–1.24)
GFR, Estimated: >60 mL/min (ref >60)
Glucose, Bld: 121 mg/dL — ABNORMAL HIGH (ref 70–99)
Potassium: 3.8 mmol/L (ref 3.5–5.1)
Sodium: 138 mmol/L (ref 135–145)
Total Bilirubin: 0.8 mg/dL (ref 0.3–1.2)
Total Protein: 7.7 g/dL (ref 6.5–8.1)

## 2022-01-22 LAB — APTT: aPTT: 36 seconds (ref 24–36)

## 2022-01-22 MED ORDER — METRONIDAZOLE 500 MG/100ML IV SOLN
500.0000 mg | Freq: Once | INTRAVENOUS | Status: AC
  Administered 2022-01-22: 500 mg via INTRAVENOUS
  Filled 2022-01-22: qty 100

## 2022-01-22 MED ORDER — VANCOMYCIN HCL IN DEXTROSE 1-5 GM/200ML-% IV SOLN
1000.0000 mg | Freq: Once | INTRAVENOUS | Status: AC
  Administered 2022-01-22: 1000 mg via INTRAVENOUS
  Filled 2022-01-22: qty 200

## 2022-01-22 MED ORDER — LACTATED RINGERS IV BOLUS (SEPSIS)
500.0000 mL | Freq: Once | INTRAVENOUS | Status: AC
  Administered 2022-01-23: 500 mL via INTRAVENOUS

## 2022-01-22 MED ORDER — SODIUM CHLORIDE 0.9 % IV SOLN
2.0000 g | Freq: Once | INTRAVENOUS | Status: AC
  Administered 2022-01-22: 2 g via INTRAVENOUS
  Filled 2022-01-22: qty 12.5

## 2022-01-22 MED ORDER — LACTATED RINGERS IV SOLN: INTRAVENOUS | Status: AC

## 2022-01-22 MED ORDER — LACTATED RINGERS IV BOLUS (SEPSIS)
1000.0000 mL | Freq: Once | INTRAVENOUS | Status: AC
  Administered 2022-01-22: 1000 mL via INTRAVENOUS

## 2022-01-22 NOTE — ED Triage Notes (Signed)
Family stated that patient "wasn't acting right" and had some fluttering of the eyes while they were visiting.  Patient sent to ER for evaluation

## 2022-01-22 NOTE — ED Provider Notes (Signed)
[facility] EMERGENCY DEPARTMENT Provider Note   CSN: [id number] Arrival date & time: 01/22/22  1754     History  Chief Complaint  Patient presents with   Fever    Hx of recent UTI     Sean Bruce is a 26 y.o. male.  Patient from nursing facility.  Patient has a traumatic brain injury patient has had 2 recent admissions for urinary tract infection most recent one was June 27 through July 2 had may be a pneumonia as well as urinary tract infection grew out Pseudomonas.  Patient's mother noted at the facility that his urine from his Foley catheter was markedly abnormal and patient had a fever.  Temp upon arrival here 99.9.  Patient tachycardic not hypotensive.  Oxygen sats were good.  Past medical history is traumatic brain injury diabetes type 2 history of urinary tract infections history of pneumonia history of seizure disorder.  Patient is on [place] for the seizures.       Home Medications Prior to Admission medications   Medication Sig Start Date End Date Taking? Authorizing Provider  acetaminophen (TYLENOL) 500 MG tablet Place 2 tablets (1,000 mg total) into feeding tube every 6 (six) hours as needed for mild pain, moderate pain or fever (temp >101.5). Patient taking differently: Place 1,000 mg into feeding tube every 6 (six) hours as needed for fever (pain). 11/25/21   Jill Alexanders, PA-C  amantadine (SYMMETREL) 100 MG capsule Place 1 capsule (100 mg total) into feeding tube 2 (two) times daily. 11/25/21   Jill Alexanders, PA-C  Amino Acids-Protein Hydrolys (FEEDING SUPPLEMENT, PRO-STAT SUGAR FREE 64,) LIQD Place 30 mLs into feeding tube daily.    [provider]  aspirin 325 MG tablet Place 1 tablet (325 mg total) into feeding tube daily. 11/25/21   Jill Alexanders, PA-C  bethanechol (URECHOLINE) 10 MG tablet Place 1 tablet (10 mg total) into feeding tube 3 (three) times daily. 11/25/21   Jill Alexanders, PA-C  bisacodyl (DULCOLAX) 10 MG suppository Place  1 suppository (10 mg total) rectally daily. 11/25/21   Jill Alexanders, PA-C  carvedilol (COREG) 25 MG tablet Place 1 tablet (25 mg total) into feeding tube 2 (two) times daily with a meal. 12/29/21 01/28/22  Manuella Ghazi, Pratik D, DO  chlorhexidine (PERIDEX) 0.12 % solution 15 mLs by Mouth Rinse route 2 (two) times daily. 11/25/21   Jill Alexanders, PA-C  cloNIDine (CATAPRES) 0.1 MG tablet Take 1 tablet (0.1 mg total) by mouth 3 (three) times daily. 12/29/21   Manuella Ghazi, Pratik D, DO  docusate (COLACE) 50 MG/5ML liquid Place 10 mLs (100 mg total) into feeding tube 2 (two) times daily. 11/25/21   Jill Alexanders, PA-C  enoxaparin (LOVENOX) 40 MG/0.4ML injection Inject 0.4 mLs (40 mg total) into the skin daily. 12/05/21   Patrecia Pour, MD  insulin aspart (NOVOLOG) 100 UNIT/ML injection Inject 0-15 Units into the skin every 4 (four) hours. Patient taking differently: Inject 0-12 Units into the skin every 4 (four) hours. Per sliding scale: 100 - 250 = 2 units 251 - 300 = 4 units 301 - 350 = 6 units 351 - 400 = 8 units 401 - 450 = 10 units 451 - 500 = 12 units 11/25/21   Simaan, Darci Current, PA-C  insulin glargine (SEMGLEE, YFGN,) 100 UNIT/ML Solostar Pen Inject 15 Units into the skin daily.    [provider]  levETIRAcetam (KEPPRA) 100 MG/ML solution Place 5 mLs (500 mg total) into feeding  tube 2 (two) times daily. 11/25/21   Adam Phenix, PA-C  Nutritional Supplements (PROSOURCE) LIQD Give 474 mLs by tube 4 (four) times daily.    [provider]  ondansetron (ZOFRAN) 4 MG tablet Place 1 tablet (4 mg total) into feeding tube every 6 (six) hours as needed for nausea. 11/25/21   Adam Phenix, PA-C  polyethylene glycol (MIRALAX / GLYCOLAX) 17 g packet Place 17 g into feeding tube daily. 11/25/21   Adam Phenix, PA-C  Water For Irrigation, Sterile (FREE WATER) SOLN Place 100 mLs into feeding tube 4 (four) times daily. 11/25/21   Adam Phenix, PA-C      Allergies     Patient has no known allergies.    Review of Systems   Review of Systems  Unable to perform ROS: Patient nonverbal    Physical Exam Updated Vital Signs BP (!) 151/96   Pulse (!) 116   Temp 99.8 F (37.7 C) (Rectal)   Resp 19   SpO2 98%  Physical Exam Vitals and nursing note reviewed.  Constitutional:      General: He is not in acute distress.    Appearance: He is well-developed. He is ill-appearing.  HENT:     Head: Normocephalic and atraumatic.  Eyes:     Conjunctiva/sclera: Conjunctivae normal.  Cardiovascular:     Rate and Rhythm: Regular rhythm. Tachycardia present.     Heart sounds: No murmur heard. Pulmonary:     Effort: Pulmonary effort is normal. No respiratory distress.     Breath sounds: Normal breath sounds.  Abdominal:     General: There is no distension.     Palpations: Abdomen is soft.     Tenderness: There is no abdominal tenderness.  Genitourinary:    Comments: Fully catheter in place urine appears very dirty. Musculoskeletal:        General: No swelling.     Cervical back: Neck supple.  Skin:    General: Skin is warm and dry.     Capillary Refill: Capillary refill takes less than 2 seconds.  Neurological:     Mental Status: Mental status is at baseline.  Psychiatric:        Mood and Affect: Mood normal.     ED Results / Procedures / Treatments   Labs (all labs ordered are listed, but only abnormal results are displayed) Labs Reviewed  COMPREHENSIVE METABOLIC PANEL - Abnormal; Notable for the following components:      Result Value   Glucose, Bld 121 (*)    Creatinine, Ser 0.51 (*)    All other components within normal limits  CBC WITH DIFFERENTIAL/PLATELET - Abnormal; Notable for the following components:   WBC 16.1 (*)    Neutro Abs 11.5 (*)    Monocytes Absolute 2.1 (*)    Eosinophils Absolute 0.7 (*)    Abs Immature Granulocytes 0.18 (*)    All other components within normal limits  URINALYSIS, ROUTINE W REFLEX MICROSCOPIC -  Abnormal; Notable for the following components:   Color, Urine RED (*)    APPearance CLOUDY (*)    Hgb urine dipstick LARGE (*)    Protein, ur 100 (*)    Leukocytes,Ua MODERATE (*)    RBC / HPF >50 (*)    WBC, UA >50 (*)    All other components within normal limits  CULTURE, BLOOD (ROUTINE X 2)  CULTURE, BLOOD (ROUTINE X 2)  RESP PANEL BY RT-PCR (FLU A&B, COVID) [id number]  URINE CULTURE  LACTIC  ACID, PLASMA  LACTIC ACID, PLASMA  PROTIME-INR  APTT    EKG EKG Interpretation  Date/Time:  Wednesday January 22 2022 22:51:33 EDT Ventricular Rate:  117 PR Interval:  141 QRS Duration: 93 QT Interval:  337 QTC Calculation: 471 R Axis:   155 Text Interpretation: Sinus tachycardia Probable right ventricular hypertrophy Inferior infarct, old No significant change since last tracing Confirmed by Vanetta Mulders [phone]) on 01/22/2022 11:27:01 PM  Radiology DG Chest Portable 1 View  Result Date: 01/22/2022 CLINICAL DATA:  Fever and sepsis. EXAM: PORTABLE CHEST 1 VIEW COMPARISON:  12/24/2021 FINDINGS: Shallow inspiration. Heart size and pulmonary vascularity are normal. Patchy infiltrates in the left lung base seen previously are improving. Right lung is clear. No pleural effusions. No pneumothorax. Mediastinal contours appear intact. IMPRESSION: No active disease. Electronically Signed   By: Burman Nieves M.D.   On: 01/22/2022 20:37    Procedures Procedures    Medications Ordered in ED Medications  lactated ringers infusion (has no administration in time range)  lactated ringers bolus 1,000 mL (0 mLs Intravenous Stopped 01/22/22 2328)    And  lactated ringers bolus 1,000 mL (0 mLs Intravenous Stopped 01/22/22 2328)    And  lactated ringers bolus 500 mL (has no administration in time range)  vancomycin (VANCOCIN) IVPB 1000 mg/200 mL premix (1,000 mg Intravenous New Bag/Given 01/22/22 2334)  ceFEPIme (MAXIPIME) 2 g in sodium chloride 0.9 % 100 mL IVPB (0 g Intravenous Stopped 01/22/22 2311)   metroNIDAZOLE (FLAGYL) IVPB 500 mg (0 mg Intravenous Stopped 01/22/22 2328)    ED Course/ Medical Decision Making/ A&P                           Medical Decision Making Amount and/or Complexity of Data Reviewed Labs: ordered. Radiology: ordered. ECG/medicine tests: ordered.  Risk Prescription drug management. Decision regarding hospitalization.   CRITICAL CARE Performed by: Vanetta Mulders Total critical care time: 45 minutes Critical care time was exclusive of separately billable procedures and treating other patients. Critical care was necessary to treat or prevent imminent or life-threatening deterioration. Critical care was time spent personally by me on the following activities: development of treatment plan with patient and/or surrogate as well as nursing, discussions with consultants, evaluation of patient's response to treatment, examination of patient, obtaining history from patient or surrogate, ordering and performing treatments and interventions, ordering and review of laboratory studies, ordering and review of radiographic studies, pulse oximetry and re-evaluation of patient's condition.  Patient with concerns for sepsis.  Does have a leukocytosis lactic acid not elevated fever at institution temp here 99.9.  Urine markedly abnormal but does not have bacteria.  Lactic acid was normal at 0.9.  Patient started on broad-spectrum antibiotics with recent admission he grew Pseudomonas from his urine.   Final Clinical Impression(s) / ED Diagnoses Final diagnoses:  Sepsis, due to unspecified organism, unspecified whether acute organ dysfunction present [facility])    Rx / DC Orders ED Discharge Orders     None         Vanetta Mulders, MD 01/22/22 2356

## 2022-01-22 NOTE — Sepsis Progress Note (Signed)
Following for sepsis monitoring ?

## 2022-01-23 DIAGNOSIS — S069X0S Unspecified intracranial injury without loss of consciousness, sequela: Secondary | ICD-10-CM

## 2022-01-23 DIAGNOSIS — K5900 Constipation, unspecified: Secondary | ICD-10-CM

## 2022-01-23 DIAGNOSIS — Z930 Tracheostomy status: Secondary | ICD-10-CM

## 2022-01-23 DIAGNOSIS — I10 Essential (primary) hypertension: Secondary | ICD-10-CM | POA: Diagnosis not present

## 2022-01-23 DIAGNOSIS — A419 Sepsis, unspecified organism: Secondary | ICD-10-CM

## 2022-01-23 DIAGNOSIS — E119 Type 2 diabetes mellitus without complications: Secondary | ICD-10-CM

## 2022-01-23 DIAGNOSIS — R8281 Pyuria: Secondary | ICD-10-CM

## 2022-01-23 DIAGNOSIS — N390 Urinary tract infection, site not specified: Secondary | ICD-10-CM

## 2022-01-23 LAB — CBC
HCT: 37.9 % — ABNORMAL LOW (ref 39.0–52.0)
Hemoglobin: 12 g/dL — ABNORMAL LOW (ref 13.0–17.0)
MCH: 29.2 pg (ref 26.0–34.0)
MCHC: 31.7 g/dL (ref 30.0–36.0)
MCV: 92.2 fL (ref 80.0–100.0)
Platelets: 194 10*3/uL (ref 150–400)
RBC: 4.11 MIL/uL — ABNORMAL LOW (ref 4.22–5.81)
RDW: 15 % (ref 11.5–15.5)
WBC: 12.3 10*3/uL — ABNORMAL HIGH (ref 4.0–10.5)
nRBC: 0 % (ref 0.0–0.2)

## 2022-01-23 LAB — COMPREHENSIVE METABOLIC PANEL
ALT: 31 U/L (ref 0–44)
AST: 17 U/L (ref 15–41)
Albumin: 3 g/dL — ABNORMAL LOW (ref 3.5–5.0)
Alkaline Phosphatase: 89 U/L (ref 38–126)
Anion gap: 8 (ref 5–15)
BUN: 13 mg/dL (ref 6–20)
CO2: 25 mmol/L (ref 22–32)
Calcium: 9.2 mg/dL (ref 8.9–10.3)
Chloride: 108 mmol/L (ref 98–111)
Creatinine, Ser: 0.49 mg/dL — ABNORMAL LOW (ref 0.61–1.24)
GFR, Estimated: >60 mL/min (ref >60)
Glucose, Bld: 93 mg/dL (ref 70–99)
Potassium: 3.6 mmol/L (ref 3.5–5.1)
Sodium: 141 mmol/L (ref 135–145)
Total Bilirubin: 1 mg/dL (ref 0.3–1.2)
Total Protein: 5.9 g/dL — ABNORMAL LOW (ref 6.5–8.1)

## 2022-01-23 LAB — GLUCOSE, CAPILLARY
Glucose-Capillary: 100 mg/dL — ABNORMAL HIGH (ref 70–99)
Glucose-Capillary: 107 mg/dL — ABNORMAL HIGH (ref 70–99)
Glucose-Capillary: 111 mg/dL — ABNORMAL HIGH (ref 70–99)
Glucose-Capillary: 130 mg/dL — ABNORMAL HIGH (ref 70–99)
Glucose-Capillary: 151 mg/dL — ABNORMAL HIGH (ref 70–99)

## 2022-01-23 LAB — APTT: aPTT: 39 seconds — ABNORMAL HIGH (ref 24–36)

## 2022-01-23 LAB — PHOSPHORUS: Phosphorus: 3.7 mg/dL (ref 2.5–4.6)

## 2022-01-23 LAB — MAGNESIUM: Magnesium: 1.7 mg/dL (ref 1.7–2.4)

## 2022-01-23 MED ORDER — FREE WATER
100.0000 mL | Status: DC
  Administered 2022-01-23 – 2022-01-26 (×18): 100 mL

## 2022-01-23 MED ORDER — ACETAMINOPHEN 650 MG RE SUPP: 650.0000 mg | Freq: Four times a day (QID) | RECTAL | Status: DC | PRN

## 2022-01-23 MED ORDER — PROSOURCE PO LIQD: 474.0000 mL | Freq: Four times a day (QID) | ORAL | Status: DC

## 2022-01-23 MED ORDER — LABETALOL HCL 5 MG/ML IV SOLN: 5.0000 mg | Freq: Four times a day (QID) | INTRAVENOUS | Status: DC | PRN

## 2022-01-23 MED ORDER — AMANTADINE HCL 100 MG PO CAPS
100.0000 mg | ORAL_CAPSULE | Freq: Two times a day (BID) | ORAL | Status: DC
Start: 2022-01-23 — End: 2022-01-26
  Administered 2022-01-23 – 2022-01-26 (×7): 100 mg
  Filled 2022-01-23 (×7): qty 1

## 2022-01-23 MED ORDER — LEVETIRACETAM 100 MG/ML PO SOLN
500.0000 mg | Freq: Two times a day (BID) | ORAL | Status: DC
  Administered 2022-01-23 – 2022-01-26 (×7): 500 mg
  Filled 2022-01-23 (×9): qty 5

## 2022-01-23 MED ORDER — VANCOMYCIN HCL 1750 MG/350ML IV SOLN
1750.0000 mg | Freq: Two times a day (BID) | INTRAVENOUS | Status: DC
Start: 2022-01-23 — End: 2022-01-26
  Administered 2022-01-23 – 2022-01-26 (×7): 1750 mg via INTRAVENOUS
  Filled 2022-01-23 (×12): qty 350

## 2022-01-23 MED ORDER — PROSOURCE TF PO LIQD
45.0000 mL | Freq: Every day | ORAL | Status: DC
  Administered 2022-01-23 – 2022-01-26 (×4): 45 mL
  Filled 2022-01-23 (×4): qty 45

## 2022-01-23 MED ORDER — OSMOLITE 1.2 CAL PO LIQD: 1000.0000 mL | ORAL | Status: DC

## 2022-01-23 MED ORDER — OSMOLITE 1.5 CAL PO LIQD
1000.0000 mL | ORAL | Status: DC
  Administered 2022-01-23: 1000 mL

## 2022-01-23 MED ORDER — ONDANSETRON HCL 4 MG PO TABS: 4.0000 mg | ORAL_TABLET | Freq: Four times a day (QID) | ORAL | Status: DC | PRN

## 2022-01-23 MED ORDER — CARVEDILOL 12.5 MG PO TABS
25.0000 mg | ORAL_TABLET | Freq: Two times a day (BID) | ORAL | Status: DC
Start: 2022-01-23 — End: 2022-01-26
  Administered 2022-01-23 – 2022-01-26 (×7): 25 mg
  Filled 2022-01-23 (×7): qty 2

## 2022-01-23 MED ORDER — CHLORHEXIDINE GLUCONATE 0.12 % MT SOLN
15.0000 mL | Freq: Two times a day (BID) | OROMUCOSAL | Status: DC
  Administered 2022-01-23 – 2022-01-26 (×7): 15 mL via OROMUCOSAL
  Filled 2022-01-23 (×7): qty 15

## 2022-01-23 MED ORDER — ENOXAPARIN SODIUM 40 MG/0.4ML IJ SOSY
40.0000 mg | PREFILLED_SYRINGE | INTRAMUSCULAR | Status: DC
  Administered 2022-01-23 – 2022-01-26 (×4): 40 mg via SUBCUTANEOUS
  Filled 2022-01-23 (×4): qty 0.4

## 2022-01-23 MED ORDER — FREE WATER: 100.0000 mL | Freq: Four times a day (QID) | Status: DC

## 2022-01-23 MED ORDER — ONDANSETRON HCL 4 MG/2ML IJ SOLN: 4.0000 mg | Freq: Four times a day (QID) | INTRAMUSCULAR | Status: DC | PRN

## 2022-01-23 MED ORDER — ACETAMINOPHEN 325 MG PO TABS
650.0000 mg | ORAL_TABLET | Freq: Four times a day (QID) | ORAL | Status: DC | PRN
  Administered 2022-01-24: 650 mg via ORAL
  Filled 2022-01-23: qty 2

## 2022-01-23 MED ORDER — PRO-STAT SUGAR FREE PO LIQD: 30.0000 mL | Freq: Every day | ORAL | Status: DC

## 2022-01-23 MED ORDER — BETHANECHOL CHLORIDE 10 MG PO TABS
10.0000 mg | ORAL_TABLET | Freq: Three times a day (TID) | ORAL | Status: DC
Start: 2022-01-23 — End: 2022-01-26
  Administered 2022-01-23 – 2022-01-26 (×9): 10 mg
  Filled 2022-01-23 (×13): qty 1

## 2022-01-23 MED ORDER — DOCUSATE SODIUM 50 MG/5ML PO LIQD
100.0000 mg | Freq: Two times a day (BID) | ORAL | Status: DC
Start: 2022-01-23 — End: 2022-01-26
  Administered 2022-01-23 – 2022-01-26 (×7): 100 mg
  Filled 2022-01-23 (×9): qty 10

## 2022-01-23 MED ORDER — CLONIDINE HCL 0.1 MG PO TABS
0.1000 mg | ORAL_TABLET | Freq: Three times a day (TID) | ORAL | Status: DC
  Administered 2022-01-23 – 2022-01-26 (×10): 0.1 mg via ORAL
  Filled 2022-01-23 (×10): qty 1

## 2022-01-23 MED ORDER — POLYETHYLENE GLYCOL 3350 17 G PO PACK
17.0000 g | PACK | Freq: Every day | ORAL | Status: DC
Start: 2022-01-23 — End: 2022-01-26
  Administered 2022-01-23 – 2022-01-26 (×4): 17 g
  Filled 2022-01-23 (×4): qty 1

## 2022-01-23 MED ORDER — CEFEPIME HCL 2 G IV SOLR
2.0000 g | Freq: Three times a day (TID) | INTRAVENOUS | Status: DC
Start: 2022-01-23 — End: 2022-01-26
  Administered 2022-01-23 – 2022-01-26 (×10): 2 g via INTRAVENOUS
  Filled 2022-01-23 (×10): qty 12.5

## 2022-01-23 NOTE — Progress Notes (Signed)
Initial Nutrition Assessment  DOCUMENTATION CODES:   Not applicable  INTERVENTION:   -Obtain new wt -Initiate Osmolite 1.5 @ 60 ml/hr via PEG  45 ml Prosource TF daily.    100 ml free water flush every 4 hours  Tube feeding regimen provides 2200 kcal (100% of needs), 101 grams of protein, and 1097 ml of H2O. Total free water: 1607 ml daily  NUTRITION DIAGNOSIS:   Inadequate oral intake related to inability to eat as evidenced by NPO status.  GOAL:   Patient will meet greater than or equal to 90% of their needs  MONITOR:   TF tolerance  REASON FOR ASSESSMENT:   Malnutrition Screening Tool    ASSESSMENT:   Pt with medical history significant of T2DM, TBI, seizures, PEG tube dependent, chronic indwelling Foley catheter who presents due to fever.  Pt admitted with sepsis with UTI.   Reviewed I/O's: +3.5 L x 24 hours  UOP: 600 ml x 24 hours  Pt unavailable at time of visit. Attempted to speak with pt via call to hospital room phone, however, unable to reach. RD unable to obtain further nutrition-related history or complete nutrition-focused physical exam at this time.    Spoke with staff at [facility] to confirm home TF regimen. PTA pt was receiving Isosource 1.5 @ 60 ml/hr and 100 ml free water flush every 4 hours. Regimen provides 2160 kcals, 98 grams protein, and 1700 ml free water daily.   Reviewed wt hx; pt has experienced a 4.4% wt loss over the past 2 months in early July, which is not significant time frame. RD will obtain new wt to better assess wt changes.   Case discussed with MD, who gave RD permission to initiate TF per home regimen.    Medications reviewed and include colace, keppra, miralax, and lactated ringers infusion @ 150 ml/hr.   Lab Results  Component Value Date   HGBA1C 5.2 09/27/2021   PTA DM medications are 0-15 units insulin aspart every 4 hours and 15 units insulin glargine daily.   Labs reviewed: CBGS: 107 (inpatient orders for  glycemic control are none).    Diet Order:   Diet Order             Diet NPO time specified  Diet effective now                   EDUCATION NEEDS:   Not appropriate for education at this time  Skin:  Skin Assessment: Reviewed RN Assessment  Last BM:  Unknown  Height:   Ht Readings from Last 1 Encounters:  12/24/21 6' (1.829 m)    Weight:   Wt Readings from Last 1 Encounters:  12/29/21 80.2 kg    Ideal Body Weight:  80.9 kg  BMI:  There is no height or weight on file to calculate BMI.  Estimated Nutritional Needs:   Kcal:  2000-2200  Protein:  95-110 grams  Fluid:  > 2 L    Levada Schilling, RD, LDN, CDCES Registered Dietitian II Certified Diabetes Care and Education Specialist Please refer to [facility] for RD and/or RD on-call/weekend/after hours pager

## 2022-01-23 NOTE — Progress Notes (Signed)
Patient rested this shift, Small tremors noted and Charge nurse Bree J. Notified and came to bedside. Continued to monitor patient no change in status. SCD applied, Cardiac monitor applied and Medications given when approved by pharmacy.
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
## 2022-01-23 NOTE — Assessment & Plan Note (Signed)
Continue empiric cefepime and vanc Follow urine culture

## 2022-01-23 NOTE — Consult Note (Signed)
WOC Nurse Consult Note: Patient receiving care in [facility] 304. Reason for Consult: sacral wounds--see pics Wound type: scattered areas of pink hypopigmentation except for one very small open area on the left sacral area that is slightly darker pink. Pressure Injury POA: Yes Measurement: To be provided by the bedside RN in the flowsheet section  Wound bed: please see photos Drainage (amount, consistency, odor) none Periwound: intact Dressing procedure/placement/frequency: Use a sacral foam dressing over the sacral areas of hypopigmented (pink) areas. Change every 3 days and prn.  Monitor the wound area(s) for worsening of condition such as: Signs/symptoms of infection,  Increase in size,  Development of or worsening of odor, Development of pain, or increased pain at the affected locations.  Notify the medical team if any of these develop.  WOC nurse will not follow at this time.  Please re-consult the WOC team if needed.  Helmut Muster, RN, MSN, CWOCN, CNS-BC, pager [phone]

## 2022-01-23 NOTE — NC FL2 (Signed)
[place] MEDICAID FL2 LEVEL OF CARE SCREENING TOOL     IDENTIFICATION  Patient Name: Renell Allum Birthdate: 1995-09-07 Sex: male Admission Date (Current Location): 01/22/2022  [facility] and [place] Number:  [employer] and Address:  [facility],  [address], [place] [address]      Provider Number: [id number]  Attending Physician Name and Address:  Catarina Hartshorn, MD  Relative Name and Phone Number:       Current Level of Care: Hospital Recommended Level of Care: Skilled Nursing Facility Prior Approval Number:    Date Approved/Denied:   PASRR Number:    Discharge Plan: SNF    Current Diagnoses: Patient Active Problem List   Diagnosis Date Noted   Sepsis secondary to UTI (HCC) 01/22/2022   Diabetes mellitus type 2 in nonobese (HCC) 12/25/2021   UTI (urinary tract infection) 12/25/2021   CAP (community acquired pneumonia) 12/25/2021   Seizure disorder (HCC) 12/25/2021   SIRS (systemic inflammatory response syndrome) (HCC) 12/24/2021   Sepsis (HCC) 12/02/2021   Pressure injury of skin 12/02/2021   Palliative care by specialist    Subdural hematoma (HCC) 09/26/2021   Traumatic brain injury (HCC) 09/25/2021    Orientation RESPIRATION BLADDER Height & Weight     Self  Normal Indwelling catheter Weight:   Height:     BEHAVIORAL SYMPTOMS/MOOD NEUROLOGICAL BOWEL NUTRITION STATUS      Incontinent Feeding tube  AMBULATORY STATUS COMMUNICATION OF NEEDS Skin   Total Care Does not communicate Normal                       Personal Care Assistance Level of Assistance  Total care           Functional Limitations Info  Sight, Hearing, Speech Sight Info: Adequate Hearing Info: Adequate Speech Info: Impaired    SPECIAL CARE FACTORS FREQUENCY                       Contractures      Additional Factors Info  Code Status, Allergies, Insulin Sliding Scale Code Status Info: Full code Allergies Info: No known allergies            Current Medications (01/23/2022):  This is the current hospital active medication list Current Facility-Administered Medications  Medication Dose Route Frequency Provider Last Rate Last Admin   acetaminophen (TYLENOL) tablet 650 mg  650 mg Oral Q6H PRN Adefeso, Oladapo, DO       Or   acetaminophen (TYLENOL) suppository 650 mg  650 mg Rectal Q6H PRN Adefeso, Oladapo, DO       amantadine (SYMMETREL) capsule 100 mg  100 mg Per Tube BID Adefeso, Oladapo, DO       bethanechol (URECHOLINE) tablet 10 mg  10 mg Per Tube TID Adefeso, Oladapo, DO       carvedilol (COREG) tablet 25 mg  25 mg Per Tube BID WC Adefeso, Oladapo, DO       ceFEPIme (MAXIPIME) 2 g in sodium chloride 0.9 % 100 mL IVPB  2 g Intravenous Q8H Stevphen Rochester, RPH 200 mL/hr at 01/23/22 0458 2 g at 01/23/22 0458   chlorhexidine (PERIDEX) 0.12 % solution 15 mL  15 mL Mouth Rinse BID Adefeso, Oladapo, DO       docusate (COLACE) 50 MG/5ML liquid 100 mg  100 mg Per Tube BID Adefeso, Oladapo, DO       enoxaparin (LOVENOX) injection 40 mg  40 mg Subcutaneous Q24H  Frankey Shown, DO   40 mg at 01/23/22 0421   feeding supplement (PRO-STAT SUGAR FREE 64) liquid 30 mL  30 mL Per Tube Daily Adefeso, Oladapo, DO       free water 100 mL  100 mL Per Tube QID Adefeso, Oladapo, DO       labetalol (NORMODYNE) injection 5 mg  5 mg Intravenous Q6H PRN Adefeso, Oladapo, DO       lactated ringers infusion   Intravenous Continuous Adefeso, Oladapo, DO 150 mL/hr at 01/23/22 0540 New Bag at 01/23/22 0540   levETIRAcetam (KEPPRA) 100 MG/ML solution 500 mg  500 mg Per Tube BID Adefeso, Oladapo, DO       ondansetron (ZOFRAN) tablet 4 mg  4 mg Oral Q6H PRN Adefeso, Oladapo, DO       Or   ondansetron (ZOFRAN) injection 4 mg  4 mg Intravenous Q6H PRN Adefeso, Oladapo, DO       polyethylene glycol (MIRALAX / GLYCOLAX) packet 17 g  17 g Per Tube Daily Adefeso, Oladapo, DO       ProSource LIQD 474 mL  474 mL Tube QID Adefeso, Oladapo, DO       vancomycin  (VANCOREADY) IVPB 1750 mg/350 mL  1,750 mg Intravenous Q12H Stevphen Rochester, RPH 175 mL/hr at 01/23/22 0542 1,750 mg at 01/23/22 0542     Discharge Medications: Please see discharge summary for a list of discharge medications.  Relevant Imaging Results:  Relevant Lab Results:   Additional Information    Karn Cassis, LCSW

## 2022-01-23 NOTE — Assessment & Plan Note (Addendum)
Continue coreg and clonidine BP controlled

## 2022-01-23 NOTE — TOC Initial Note (Signed)
Transition of Care [facility]) - Initial/Assessment Note    Patient Details  Name: Sean Bruce MRN: [id number] Date of Birth: Nov 25, 1995  [facility]) CM/SW Contact:    Karn Cassis, LCSW Phone Number: 01/23/2022, 9:06 AM  Clinical Narrative:  Pt admitted due to sepsis secondary to UTI. Pt has been a resident at [facility] for two months. Pt's legal guardian/aunt, [name] states this is pt's third admission in that time. Pt is currently at SNF under LOG as Medicaid is pending. Per Eunice Blase at [facility], okay to return. TOC will facilitate return to [facility] when medically stable.                  Expected Discharge Plan: Skilled Nursing Facility Barriers to Discharge: Continued Medical Work up   Patient Goals and CMS Choice Patient states their goals for this hospitalization and ongoing recovery are:: return to SNF   Choice offered to / list presented to : [facility] POA / Guardian  Expected Discharge Plan and Services Expected Discharge Plan: Skilled Nursing Facility In-house Referral: Clinical Social Work   Post Acute Care Choice: Skilled Nursing Facility Living arrangements for the past 2 months: Skilled Nursing Facility                                      Prior Living Arrangements/Services Living arrangements for the past 2 months: Skilled Nursing Facility Lives with:: Facility Resident Patient language and need for interpreter reviewed:: Yes Do you feel safe going back to the place where you live?: Yes      Need for Family Participation in Patient Care: Yes (Comment) Care giver support system in place?: Yes (comment)   Criminal Activity/Legal Involvement Pertinent to Current Situation/Hospitalization: No - Comment as needed  Activities of Daily Living   ADL Screening (condition at time of admission) Patient's cognitive ability adequate to safely complete daily activities?: No Patient able to express need for assistance with ADLs?: No Does the  patient have difficulty dressing or bathing?: Yes Independently performs ADLs?: No Dressing (OT): Dependent Is this a change from baseline?: Pre-admission baseline Grooming: Dependent Is this a change from baseline?: Pre-admission baseline Feeding: Dependent Is this a change from baseline?: Pre-admission baseline Bathing: Dependent Is this a change from baseline?: Pre-admission baseline Toileting: Dependent Is this a change from baseline?: Pre-admission baseline Weakness of Legs: Both Weakness of Arms/Hands: Both  Permission Sought/Granted   Permission granted to share information with : Yes, Verbal Permission Granted     Permission granted to share info w AGENCY: [facility]  Permission granted to share info w Relationship: SNF     Emotional Assessment   Attitude/Demeanor/Rapport: Unable to Assess Affect (typically observed): Unable to Assess   Alcohol / Substance Use: Not Applicable Psych Involvement: No (comment)  Admission diagnosis:  Sepsis secondary to UTI (HCC) [A41.9, N39.0] Sepsis, due to unspecified organism, unspecified whether acute organ dysfunction present [facility]) [A41.9] Patient Active Problem List   Diagnosis Date Noted   Sepsis secondary to UTI (HCC) 01/22/2022   Diabetes mellitus type 2 in nonobese (HCC) 12/25/2021   UTI (urinary tract infection) 12/25/2021   CAP (community acquired pneumonia) 12/25/2021   Seizure disorder (HCC) 12/25/2021   SIRS (systemic inflammatory response syndrome) (HCC) 12/24/2021   Sepsis (HCC) 12/02/2021   Pressure injury of skin 12/02/2021   Palliative care by specialist    Subdural hematoma (HCC) 09/26/2021   Traumatic  brain injury (HCC) 09/25/2021   PCP:  Pcp, No Pharmacy:   [facility] [place] [place] [address] [place] [address] Phone: [phone] Fax: [phone]     Social Determinants of Health (SDOH) Interventions    Readmission Risk  Interventions    12/26/2021    3:35 PM  Readmission Risk Prevention Plan  Transportation Screening Complete  PCP or Specialist Appt within 5-7 Days Not Complete  Home Care Screening Complete  Medication Review (RN CM) Complete

## 2022-01-23 NOTE — Assessment & Plan Note (Signed)
Continue keppra

## 2022-01-23 NOTE — Progress Notes (Signed)
Patient came up with chronic Foley. Dr. Thomes Dinning came to bedside to assess patient while computers where down at. Orders to pull foley and insert a new Foley due to diagnosis of Sepsis secondary to UTI.

## 2022-01-23 NOTE — Progress Notes (Signed)
Pharmacy Antibiotic Note  Sean Bruce is a 26 y.o. male admitted on 01/22/2022 with sepsis.  Pharmacy has been consulted for Vancomycin/Cefepime dosing. History of pneumonia and UTI. WBC is elevated. T max 99.9. Renal function ok.   Plan: Vancomycin 1750 mg IV q12h >>>Estimated AUC: 465 Cefepime 2g IV q8h Trend WBC, temp, renal function  F/U infectious work-up Drug levels as indicated   Temp (24hrs), Avg:99.5 F (37.5 C), Min:98.9 F (37.2 C), Max:99.9 F (37.7 C)  Recent Labs  Lab 01/22/22 1919 01/22/22 1946 01/22/22 2147  WBC  --  16.1*  --   CREATININE  --  0.51*  --   LATICACIDVEN 1.0  --  0.9    CrCl cannot be calculated (Unknown ideal weight.).    No Known Allergies  Abran Duke, PharmD, BCPS Clinical Pharmacist Phone: [phone]

## 2022-01-23 NOTE — Assessment & Plan Note (Addendum)
09/26/21 A1C 5.2 Monitor CBGs--remain controlled Hold insulin for now Will not restart Semglee as CBGs remained controlled without any insulin during hospitalization as enteral feeds changed to Osmolite 1.5 @ 60 cc/hr

## 2022-01-23 NOTE — Assessment & Plan Note (Addendum)
Present on admission Presented with fever, tachycardia, leukocytosis Due to UTI Initiated cefepime and vanc pending culture data Started Bactrim DS once urine cultures = MRSA Blood culture = contaminant Personally reviewed CXR--LLL opacity improving Lactic acid peaked 1.0 Continue IVF Sepsis physiology resolved

## 2022-01-23 NOTE — Assessment & Plan Note (Addendum)
-  history of right PCA stroke, s/p craniectomy, functional quadriplegia - Pt currently at his unresponsive baseline.  - PEG dependent - restart enteral feeding>>Initiate Osmolite 1.5 @ 60 ml/hr  - Turn q2h - Continue amantadine, bethanechol, keppra - resident at [place]
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
## 2022-01-23 NOTE — Assessment & Plan Note (Signed)
Decannulated 11/04/21 Stable on RA

## 2022-01-24 DIAGNOSIS — I10 Essential (primary) hypertension: Secondary | ICD-10-CM | POA: Diagnosis not present

## 2022-01-24 DIAGNOSIS — Z930 Tracheostomy status: Secondary | ICD-10-CM | POA: Diagnosis not present

## 2022-01-24 DIAGNOSIS — A419 Sepsis, unspecified organism: Secondary | ICD-10-CM | POA: Diagnosis not present

## 2022-01-24 LAB — BLOOD CULTURE ID PANEL (REFLEXED) - BCID2

## 2022-01-24 LAB — GLUCOSE, CAPILLARY
Glucose-Capillary: 138 mg/dL — ABNORMAL HIGH (ref 70–99)
Glucose-Capillary: 128 mg/dL — ABNORMAL HIGH (ref 70–99)
Glucose-Capillary: 133 mg/dL — ABNORMAL HIGH (ref 70–99)
Glucose-Capillary: 139 mg/dL — ABNORMAL HIGH (ref 70–99)
Glucose-Capillary: 134 mg/dL — ABNORMAL HIGH (ref 70–99)

## 2022-01-24 LAB — BASIC METABOLIC PANEL
Anion gap: 7 (ref 5–15)
BUN: 12 mg/dL (ref 6–20)
CO2: 23 mmol/L (ref 22–32)
Calcium: 8.9 mg/dL (ref 8.9–10.3)
Chloride: 107 mmol/L (ref 98–111)
Creatinine, Ser: 0.44 mg/dL — ABNORMAL LOW (ref 0.61–1.24)
GFR, Estimated: >60 mL/min (ref >60)
Glucose, Bld: 134 mg/dL — ABNORMAL HIGH (ref 70–99)
Potassium: 3.8 mmol/L (ref 3.5–5.1)
Sodium: 137 mmol/L (ref 135–145)

## 2022-01-24 LAB — CBC
HCT: 36.7 % — ABNORMAL LOW (ref 39.0–52.0)
Hemoglobin: 11.6 g/dL — ABNORMAL LOW (ref 13.0–17.0)
MCH: 29.1 pg (ref 26.0–34.0)
MCHC: 31.6 g/dL (ref 30.0–36.0)
MCV: 92 fL (ref 80.0–100.0)
Platelets: 201 10*3/uL (ref 150–400)
RBC: 3.99 MIL/uL — ABNORMAL LOW (ref 4.22–5.81)
RDW: 15.1 % (ref 11.5–15.5)
WBC: 11.7 10*3/uL — ABNORMAL HIGH (ref 4.0–10.5)
nRBC: 0 % (ref 0.0–0.2)

## 2022-01-24 LAB — MAGNESIUM: Magnesium: 1.5 mg/dL — ABNORMAL LOW (ref 1.7–2.4)

## 2022-01-24 MED ORDER — MAGNESIUM SULFATE 2 GM/50ML IV SOLN
2.0000 g | Freq: Once | INTRAVENOUS | Status: AC
Start: 2022-01-24 — End: 2022-01-24
  Administered 2022-01-24: 2 g via INTRAVENOUS
  Filled 2022-01-24: qty 50

## 2022-01-24 MED ORDER — CHLORHEXIDINE GLUCONATE CLOTH 2 % EX PADS
6.0000 | MEDICATED_PAD | Freq: Every day | CUTANEOUS | Status: DC
Start: 2022-01-24 — End: 2022-01-26
  Administered 2022-01-24 – 2022-01-26 (×4): 6 via TOPICAL
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
## 2022-01-24 NOTE — Assessment & Plan Note (Addendum)
UA >50 WBC urine culture = MRSA Continue vanco IV>>discharge with Bactrim DS x 4 more days Foley changed on 7/27

## 2022-01-24 NOTE — Assessment & Plan Note (Signed)
repleted ?

## 2022-01-25 DIAGNOSIS — G40909 Epilepsy, unspecified, not intractable, without status epilepticus: Secondary | ICD-10-CM

## 2022-01-25 DIAGNOSIS — A419 Sepsis, unspecified organism: Secondary | ICD-10-CM | POA: Diagnosis not present

## 2022-01-25 LAB — BASIC METABOLIC PANEL
Anion gap: 4 — ABNORMAL LOW (ref 5–15)
BUN: 10 mg/dL (ref 6–20)
CO2: 25 mmol/L (ref 22–32)
Calcium: 8.8 mg/dL — ABNORMAL LOW (ref 8.9–10.3)
Chloride: 107 mmol/L (ref 98–111)
Creatinine, Ser: 0.4 mg/dL — ABNORMAL LOW (ref 0.61–1.24)
GFR, Estimated: >60 mL/min (ref >60)
Glucose, Bld: 127 mg/dL — ABNORMAL HIGH (ref 70–99)
Potassium: 3.7 mmol/L (ref 3.5–5.1)
Sodium: 136 mmol/L (ref 135–145)

## 2022-01-25 LAB — CBC
HCT: 36.6 % — ABNORMAL LOW (ref 39.0–52.0)
Hemoglobin: 11.1 g/dL — ABNORMAL LOW (ref 13.0–17.0)
MCH: 29.1 pg (ref 26.0–34.0)
MCHC: 30.3 g/dL (ref 30.0–36.0)
MCV: 96.1 fL (ref 80.0–100.0)
Platelets: 195 10*3/uL (ref 150–400)
RBC: 3.81 MIL/uL — ABNORMAL LOW (ref 4.22–5.81)
RDW: 15.2 % (ref 11.5–15.5)
WBC: 11.4 10*3/uL — ABNORMAL HIGH (ref 4.0–10.5)
nRBC: 0 % (ref 0.0–0.2)

## 2022-01-25 LAB — GLUCOSE, CAPILLARY
Glucose-Capillary: 104 mg/dL — ABNORMAL HIGH (ref 70–99)
Glucose-Capillary: 111 mg/dL — ABNORMAL HIGH (ref 70–99)
Glucose-Capillary: 112 mg/dL — ABNORMAL HIGH (ref 70–99)
Glucose-Capillary: 124 mg/dL — ABNORMAL HIGH (ref 70–99)
Glucose-Capillary: 126 mg/dL — ABNORMAL HIGH (ref 70–99)
Glucose-Capillary: 130 mg/dL — ABNORMAL HIGH (ref 70–99)

## 2022-01-25 LAB — CULTURE, BLOOD (ROUTINE X 2): Special Requests: ADEQUATE

## 2022-01-25 LAB — MAGNESIUM: Magnesium: 1.7 mg/dL (ref 1.7–2.4)

## 2022-01-25 MED ORDER — POTASSIUM CHLORIDE IN NACL 20-0.9 MEQ/L-% IV SOLN: INTRAVENOUS | Status: DC

## 2022-01-25 MED ORDER — MAGNESIUM SULFATE 2 GM/50ML IV SOLN
2.0000 g | Freq: Once | INTRAVENOUS | Status: AC
  Administered 2022-01-25: 2 g via INTRAVENOUS
  Filled 2022-01-25: qty 50

## 2022-01-25 NOTE — Progress Notes (Signed)
Pt has been resting in bed throughout this writers shift. No signs or symptoms of pain or discomfort. Tolerating feedings well via peg tube. IV noted to be hanging out of pts arm during med pass. IV removed and gauze placed. 2 attempts have been made for a new IV and which were unsuccessful. Informed nurse in ICU who will come to do U/S IV. Oncoming nurse informed.
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
## 2022-01-26 ENCOUNTER — Other Ambulatory Visit: Payer: Self-pay

## 2022-01-26 ENCOUNTER — Emergency Department (HOSPITAL_COMMUNITY)
Admission: EM | Admit: 2022-01-26 | Discharge: 2022-01-27 | Disposition: A | Discharge status: Skilled Nursing Facility | Payer: Medicaid Other | Attending: Emergency Medicine | Admitting: Emergency Medicine

## 2022-01-26 ENCOUNTER — Encounter (HOSPITAL_COMMUNITY): Payer: Self-pay | Admitting: *Deleted

## 2022-01-26 DIAGNOSIS — Z79899 Other long term (current) drug therapy: Secondary | ICD-10-CM | POA: Insufficient documentation

## 2022-01-26 DIAGNOSIS — N390 Urinary tract infection, site not specified: Secondary | ICD-10-CM | POA: Diagnosis not present

## 2022-01-26 DIAGNOSIS — Z7982 Long term (current) use of aspirin: Secondary | ICD-10-CM | POA: Insufficient documentation

## 2022-01-26 DIAGNOSIS — E119 Type 2 diabetes mellitus without complications: Secondary | ICD-10-CM | POA: Insufficient documentation

## 2022-01-26 DIAGNOSIS — I10 Essential (primary) hypertension: Secondary | ICD-10-CM

## 2022-01-26 DIAGNOSIS — A419 Sepsis, unspecified organism: Secondary | ICD-10-CM | POA: Diagnosis not present

## 2022-01-26 DIAGNOSIS — T83510D Infection and inflammatory reaction due to cystostomy catheter, subsequent encounter: Secondary | ICD-10-CM

## 2022-01-26 DIAGNOSIS — R000 Tachycardia, unspecified: Secondary | ICD-10-CM | POA: Insufficient documentation

## 2022-01-26 DIAGNOSIS — Z794 Long term (current) use of insulin: Secondary | ICD-10-CM | POA: Insufficient documentation

## 2022-01-26 LAB — URINE CULTURE: Culture: >=100000 — AB

## 2022-01-26 LAB — CBC
HCT: 36.8 % — ABNORMAL LOW (ref 39.0–52.0)
Hemoglobin: 11.8 g/dL — ABNORMAL LOW (ref 13.0–17.0)
MCH: 29.1 pg (ref 26.0–34.0)
MCHC: 32.1 g/dL (ref 30.0–36.0)
MCV: 90.9 fL (ref 80.0–100.0)
Platelets: 214 10*3/uL (ref 150–400)
RBC: 4.05 MIL/uL — ABNORMAL LOW (ref 4.22–5.81)
RDW: 15.1 % (ref 11.5–15.5)
WBC: 12.8 10*3/uL — ABNORMAL HIGH (ref 4.0–10.5)
nRBC: 0 % (ref 0.0–0.2)

## 2022-01-26 LAB — BASIC METABOLIC PANEL
Anion gap: 8 (ref 5–15)
BUN: 9 mg/dL (ref 6–20)
CO2: 24 mmol/L (ref 22–32)
Calcium: 9.2 mg/dL (ref 8.9–10.3)
Chloride: 106 mmol/L (ref 98–111)
Creatinine, Ser: 0.35 mg/dL — ABNORMAL LOW (ref 0.61–1.24)
GFR, Estimated: >60 mL/min (ref >60)
Glucose, Bld: 105 mg/dL — ABNORMAL HIGH (ref 70–99)
Potassium: 4 mmol/L (ref 3.5–5.1)
Sodium: 138 mmol/L (ref 135–145)

## 2022-01-26 LAB — GLUCOSE, CAPILLARY
Glucose-Capillary: 112 mg/dL — ABNORMAL HIGH (ref 70–99)
Glucose-Capillary: 118 mg/dL — ABNORMAL HIGH (ref 70–99)
Glucose-Capillary: 131 mg/dL — ABNORMAL HIGH (ref 70–99)
Glucose-Capillary: 132 mg/dL — ABNORMAL HIGH (ref 70–99)

## 2022-01-26 LAB — MAGNESIUM: Magnesium: 1.8 mg/dL (ref 1.7–2.4)

## 2022-01-26 MED ORDER — OSMOLITE 1.5 CAL PO LIQD: 1000.0000 mL | ORAL | 0 refills | Status: DC

## 2022-01-26 MED ORDER — HYDRALAZINE HCL 25 MG PO TABS
50.0000 mg | ORAL_TABLET | Freq: Once | ORAL | Status: AC
  Administered 2022-01-26: 50 mg
  Filled 2022-01-26: qty 2

## 2022-01-26 MED ORDER — SULFAMETHOXAZOLE-TRIMETHOPRIM 800-160 MG PO TABS: 1.0000 | ORAL_TABLET | Freq: Two times a day (BID) | ORAL | 0 refills | Status: DC

## 2022-01-26 MED ORDER — CLONIDINE HCL 0.1 MG PO TABS
0.1000 mg | ORAL_TABLET | ORAL | Status: AC
  Administered 2022-01-26: 0.1 mg
  Filled 2022-01-26: qty 1

## 2022-01-26 MED ORDER — PROSOURCE TF PO LIQD: 45.0000 mL | Freq: Every day | ORAL | Status: DC

## 2022-01-26 MED ORDER — SULFAMETHOXAZOLE-TRIMETHOPRIM 800-160 MG PO TABS
1.0000 | ORAL_TABLET | Freq: Two times a day (BID) | ORAL | Status: DC
  Administered 2022-01-26: 1 via ORAL
  Filled 2022-01-26: qty 1

## 2022-01-26 NOTE — ED Notes (Signed)
Called [facility] 3 times. Each time the line went to a "beep beep beep" noise. Will try again later to call for report.

## 2022-01-26 NOTE — Progress Notes (Signed)
Patient without IV. Pharmacy was called in regards to IV medications not given on time. Patient was not able to get an IV until 2231 which was placed by Wendi Maya, AC.   Patient rested comfortably throughout shift. No signs of pain/discomfort.

## 2022-01-26 NOTE — Progress Notes (Addendum)
CSW spoke with Eunice Blase at [facility] who states the patient can return today. Patient will go to room C29, bed #2. The number to call for report is [phone]. Transportation has been arranged.  Edwin Dada, MSW, LCSW Transitions of Care  Clinical Social Worker II [phone]

## 2022-01-26 NOTE — Progress Notes (Signed)
Report called to [place] at [facility].   Tube feeding disconnected and peg flushed with 50 mls water.  IV removed.   EMS called back from ED and said that BP was 172.  Called Tim to see of EMS could bring patient back up to floor for BP med and he said he could not come back up since he has left the floor.

## 2022-01-26 NOTE — Discharge Instructions (Signed)
Have your family doctor recheck your blood pressure within 48 hours.  Make sure you are taking your medications exactly as prescribed.  Return to ER for worsening symptoms

## 2022-01-26 NOTE — ED Notes (Signed)
[facility] [employer] called at this time to setup transportation at this time.
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
## 2022-01-26 NOTE — ED Provider Notes (Signed)
[facility] EMERGENCY DEPARTMENT Provider Note   CSN: [id number] Arrival date & time: 01/26/22  1309     History  Chief Complaint  Patient presents with   Hypertension    Sean Bruce is a 26 y.o. male.   Hypertension   This patient is a very unfortunate 26 year old male history of traumatic brain injury status post PEG tube placement, decompressive craniotomy, type 2 diabetes, he has PEG tube dependent and a chronic indwelling Foley.  He had presented to the hospital on July 26 approximately 5 days ago, he was admitted to the hospital with sepsis and infection.  The patient was treated and stabilized and patient was discharged today by ambulance.  He was found to be hypertensive at discharge and the paramedics brought him back to the facility the facility refused to take him because his blood pressure was 180/130 or thereabouts and so they brought him back to the emergency department.  The patient has no complaints, he is essentially nonverbal.  He is not tachycardic or febrile.  Review of the patient's medical record shows that he actually had a UTI with a Bactrim sensitive UTI and was placed on Bactrim by tube at discharge, it also reflects that his medication administration record shows dosing of both clonidine and Coreg his baseline blood pressure medications prior to discharge this morning     Home Medications Prior to Admission medications   Medication Sig Start Date End Date Taking? Authorizing Provider  acetaminophen (TYLENOL) 500 MG tablet Place 2 tablets (1,000 mg total) into feeding tube every 6 (six) hours as needed for mild pain, moderate pain or fever (temp >101.5). Patient taking differently: Place 1,000 mg into feeding tube every 6 (six) hours as needed for fever (pain). 11/25/21   Adam Phenix, PA-C  amantadine (SYMMETREL) 100 MG capsule Place 1 capsule (100 mg total) into feeding tube 2 (two) times daily. 11/25/21   Adam Phenix, PA-C  Amino  Acids-Protein Hydrolys (FEEDING SUPPLEMENT, PRO-STAT SUGAR FREE 64,) LIQD Place 30 mLs into feeding tube daily.    [provider]  aspirin 325 MG tablet Place 1 tablet (325 mg total) into feeding tube daily. 11/25/21   Adam Phenix, PA-C  bethanechol (URECHOLINE) 10 MG tablet Place 1 tablet (10 mg total) into feeding tube 3 (three) times daily. 11/25/21   Adam Phenix, PA-C  bisacodyl (DULCOLAX) 10 MG suppository Place 1 suppository (10 mg total) rectally daily. 11/25/21   Adam Phenix, PA-C  carvedilol (COREG) 25 MG tablet Place 1 tablet (25 mg total) into feeding tube 2 (two) times daily with a meal. 12/29/21 01/28/22  Sherryll Burger, Pratik D, DO  chlorhexidine (PERIDEX) 0.12 % solution 15 mLs by Mouth Rinse route 2 (two) times daily. 11/25/21   Adam Phenix, PA-C  cloNIDine (CATAPRES) 0.1 MG tablet Take 1 tablet (0.1 mg total) by mouth 3 (three) times daily. 12/29/21   Sherryll Burger, Pratik D, DO  docusate (COLACE) 50 MG/5ML liquid Place 10 mLs (100 mg total) into feeding tube 2 (two) times daily. 11/25/21   Adam Phenix, PA-C  enoxaparin (LOVENOX) 40 MG/0.4ML injection Inject 0.4 mLs (40 mg total) into the skin daily. 12/05/21   Tyrone Nine, MD  insulin aspart (NOVOLOG) 100 UNIT/ML injection Inject 0-15 Units into the skin every 4 (four) hours. Patient taking differently: Inject 0-12 Units into the skin every 4 (four) hours. Per sliding scale: 100 - 250 = 2 units 251 - 300 = 4 units 301 - 350 =  6 units 351 - 400 = 8 units 401 - 450 = 10 units 451 - 500 = 12 units 11/25/21   Simaan, Francine Graven, PA-C  levETIRAcetam (KEPPRA) 100 MG/ML solution Place 5 mLs (500 mg total) into feeding tube 2 (two) times daily. 11/25/21   Adam Phenix, PA-C  Nutritional Supplements (FEEDING SUPPLEMENT, OSMOLITE 1.5 CAL,) LIQD Place 1,000 mLs into feeding tube continuous. Run at 60 cc per hour continuous 01/26/22   Tat, David, MD  Nutritional Supplements (FEEDING SUPPLEMENT, PROSOURCE TF,) liquid  Place 45 mLs into feeding tube daily. 01/26/22   Catarina Hartshorn, MD  ondansetron (ZOFRAN) 4 MG tablet Place 1 tablet (4 mg total) into feeding tube every 6 (six) hours as needed for nausea. 11/25/21   Adam Phenix, PA-C  polyethylene glycol (MIRALAX / GLYCOLAX) 17 g packet Place 17 g into feeding tube daily. 11/25/21   Adam Phenix, PA-C  sulfamethoxazole-trimethoprim (BACTRIM DS) 800-160 MG tablet Take 1 tablet by mouth every 12 (twelve) hours. X 4 days 01/26/22   Catarina Hartshorn, MD  Water For Irrigation, Sterile (FREE WATER) SOLN Place 100 mLs into feeding tube 4 (four) times daily. 11/25/21   Adam Phenix, PA-C      Allergies    Patient has no known allergies.    Review of Systems   Review of Systems  All other systems reviewed and are negative.   Physical Exam Updated Vital Signs BP (!) 142/97   Pulse 86   Temp 99.7 F (37.6 C) (Rectal)   Resp 17   Ht 1.829 m (6')   Wt 80.3 kg   SpO2 98%   BMI 24.01 kg/m  Physical Exam Constitutional:      Comments: The patient is obtunded but at baseline  HENT:     Head: Normocephalic and atraumatic.     Comments: Craniotomy site soft    Nose: Nose normal.     Mouth/Throat:     Comments: Patient refuses or cannot to open his mouth Eyes:     Comments: When eyelids are forced open has normal conjunctive a  Cardiovascular:     Rate and Rhythm: Normal rate.  Pulmonary:     Effort: Respiratory distress present.  Abdominal:     Comments: Soft nontender obese abdomen.  G-tube present and clean at the site  Genitourinary:    Comments: Foley catheter in place, clear urine Musculoskeletal:     Comments: No obvious lower extremity edema  Skin:    General: Skin is warm.     Findings: No rash.  Neurological:     Comments: At baseline, does not follow commands, has rigidity in muscles, keeps jaw clenched, this is similar to presentations of seen in the past for this patient     ED Results / Procedures / Treatments   Labs (all  labs ordered are listed, but only abnormal results are displayed) Labs Reviewed - No data to display  EKG None  Radiology No results found.  Procedures Procedures    Medications Ordered in ED Medications  hydrALAZINE (APRESOLINE) tablet 50 mg (50 mg Per Tube Given 01/26/22 1401)  cloNIDine (CATAPRES) tablet 0.1 mg (0.1 mg Per Tube Given 01/26/22 1409)    ED Course/ Medical Decision Making/ A&P                           Medical Decision Making Risk Prescription drug management.   Ultimately the patient is purely here because  of an abnormal blood pressure.  I suspect this is his chronic hypertension.  His measurement here is 150/109, he is not tachycardic, he is not febrile.  At this time he will be given a dose of his blood pressure medication through his PEG tube and an extra dose of hydralazine, anticipate discharge shortly.  Blood pressure rechecked at 139/95, the patient is asymptomatic otherwise, he does not appear to be in distress and is not tachycardic.  Stable for discharge           Final Clinical Impression(s) / ED Diagnoses Final diagnoses:  Primary hypertension    Rx / DC Orders ED Discharge Orders     None         Eber Hong, MD 01/26/22 1447

## 2022-01-26 NOTE — Progress Notes (Signed)
Pharmacy Antibiotic Note  Sean Bruce is a 26 y.o. male admitted on 01/22/2022 with sepsis.  Pharmacy has been consulted for Vancomycin dosing.  Plan: Cefepime 7/26 >> 7/30 Bactrim 7/30>> Vanco 7/26 >>  7/26 Bcx: 2/4 bottles staph aureus, staph epidermidis methicillin resistant  7'/26 Ucx: >100k MRSA  6/28 Ucx: pseudomonas 6/4 Ucx: enterococcus and pseudomonas    Temp (24hrs), Avg:98.3 F (36.8 C), Min:98.3 F (36.8 C), Max:98.3 F (36.8 C)  Recent Labs  Lab 01/22/22 1919 01/22/22 1946 01/22/22 2147 01/23/22 0442 01/24/22 0921 01/25/22 0603 01/26/22 0553  WBC  --  16.1*  --  12.3* 11.7* 11.4* 12.8*  CREATININE  --  0.51*  --  0.49* 0.44* 0.40* 0.35*  LATICACIDVEN 1.0  --  0.9  --   --   --   --      Estimated Creatinine Clearance: 152.2 mL/min (A) (by C-G formula based on SCr of 0.35 mg/dL (L)).    No Known Allergies  Luan Pulling, PharmD, [facility] Clinical Pharmacist

## 2022-01-26 NOTE — ED Triage Notes (Signed)
Pt brought in by [facility] from [facility] with c/o hypertension. Pt was just discharged from the floor here at [facility] and his BP was 170/120 upon arrival into the ambulance. Nursing staff at [facility] would not accept the pt back due his BP being 185/138 upon arrival to the facility. Last BP from EMS 180/135.

## 2022-01-27 LAB — CULTURE, BLOOD (ROUTINE X 2)
Culture: NO GROWTH
Special Requests: ADEQUATE

## 2022-01-27 LAB — CBG MONITORING, ED
Glucose-Capillary: 105 mg/dL — ABNORMAL HIGH (ref 70–99)
Glucose-Capillary: 115 mg/dL — ABNORMAL HIGH (ref 70–99)
Glucose-Capillary: 113 mg/dL — ABNORMAL HIGH (ref 70–99)

## 2022-01-27 MED ORDER — PROSOURCE TF PO LIQD
45.0000 mL | Freq: Every day | ORAL | Status: DC
  Filled 2022-01-27 (×3): qty 45

## 2022-01-27 MED ORDER — POLYETHYLENE GLYCOL 3350 17 G PO PACK: 17.0000 g | PACK | Freq: Every day | ORAL | Status: DC

## 2022-01-27 MED ORDER — CARVEDILOL 12.5 MG PO TABS
25.0000 mg | ORAL_TABLET | Freq: Two times a day (BID) | ORAL | Status: DC
  Administered 2022-01-27 (×2): 25 mg
  Filled 2022-01-27 (×2): qty 2

## 2022-01-27 MED ORDER — PROSOURCE PLUS PO LIQD: 30.0000 mL | Freq: Every day | ORAL | Status: DC

## 2022-01-27 MED ORDER — CLONIDINE HCL 0.1 MG PO TABS
0.1000 mg | ORAL_TABLET | Freq: Once | ORAL | Status: AC
  Administered 2022-01-27: 0.1 mg via ORAL
  Filled 2022-01-27: qty 1

## 2022-01-27 MED ORDER — INSULIN ASPART 100 UNIT/ML IJ SOLN: 0.0000 [IU] | Freq: Three times a day (TID) | INTRAMUSCULAR | Status: DC

## 2022-01-27 MED ORDER — OSMOLITE 1.5 CAL PO LIQD
1000.0000 mL | ORAL | Status: DC
Start: 2022-01-27 — End: 2022-01-27

## 2022-01-27 MED ORDER — OSMOLITE 1.5 CAL PO LIQD
1000.0000 mL | ORAL | Status: DC
  Administered 2022-01-27: 1000 mL
  Filled 2022-01-27 (×3): qty 1000

## 2022-01-27 MED ORDER — ONDANSETRON HCL 4 MG PO TABS
4.0000 mg | ORAL_TABLET | Freq: Four times a day (QID) | ORAL | Status: DC | PRN
Start: 2022-01-27 — End: 2022-01-28

## 2022-01-27 MED ORDER — SULFAMETHOXAZOLE-TRIMETHOPRIM 800-160 MG PO TABS
1.0000 | ORAL_TABLET | Freq: Two times a day (BID) | ORAL | Status: DC
  Administered 2022-01-27: 1 via ORAL
  Filled 2022-01-27: qty 1

## 2022-01-27 MED ORDER — OXYCODONE-ACETAMINOPHEN 5-325 MG PO TABS
1.0000 | ORAL_TABLET | Freq: Once | ORAL | Status: AC
  Administered 2022-01-27: 1
  Filled 2022-01-27: qty 1

## 2022-01-27 MED ORDER — PROSOURCE TF PO LIQD
45.0000 mL | Freq: Every day | ORAL | Status: DC
  Administered 2022-01-27: 45 mL
  Filled 2022-01-27 (×3): qty 45

## 2022-01-27 MED ORDER — AMANTADINE HCL 100 MG PO CAPS
100.0000 mg | ORAL_CAPSULE | Freq: Two times a day (BID) | ORAL | Status: DC
Start: 2022-01-27 — End: 2022-01-28
  Administered 2022-01-27: 100 mg
  Filled 2022-01-27: qty 1

## 2022-01-27 MED ORDER — PROSOURCE NO CARB PO LIQD
30.0000 mL | Freq: Every day | ORAL | Status: DC
  Filled 2022-01-27 (×2): qty 30

## 2022-01-27 MED ORDER — INSULIN ASPART 100 UNIT/ML IJ SOLN: 0.0000 [IU] | Freq: Every day | INTRAMUSCULAR | Status: DC

## 2022-01-27 MED ORDER — LEVETIRACETAM 100 MG/ML PO SOLN
500.0000 mg | Freq: Two times a day (BID) | ORAL | Status: DC
  Administered 2022-01-27: 500 mg
  Filled 2022-01-27 (×5): qty 5

## 2022-01-27 MED ORDER — INSULIN ASPART 100 UNIT/ML IJ SOLN: 0.0000 [IU] | INTRAMUSCULAR | Status: DC

## 2022-01-27 MED ORDER — HYDRALAZINE HCL 25 MG PO TABS
50.0000 mg | ORAL_TABLET | Freq: Once | ORAL | Status: AC
  Administered 2022-01-27: 50 mg via ORAL
  Filled 2022-01-27: qty 2

## 2022-01-27 MED ORDER — CLONIDINE HCL 0.1 MG PO TABS
0.1000 mg | ORAL_TABLET | Freq: Three times a day (TID) | ORAL | Status: DC
  Administered 2022-01-27 (×2): 0.1 mg via ORAL
  Filled 2022-01-27 (×2): qty 1

## 2022-01-27 NOTE — ED Notes (Signed)
[facility] called and pt is on list to go back [facility].

## 2022-01-27 NOTE — Social Work (Signed)
Charge Nurse called at 9:36pm reporting that the Pt was still in the ED due to the guardian not wanting him to go back to the facility. The nurse did not report concerns or reasons why however CSW told the ED charge nurse that they would have to find placement for the Pt from the actully facility as the Pt has been up for discharge.

## 2022-01-27 NOTE — ED Notes (Signed)
Attempted to call report to [facility] X2

## 2022-01-27 NOTE — ED Notes (Signed)
Pt has been treated here for hypertension. Pt given BP meds last night to try and lower BP per tube due to facility stating that they will not except the pt if his BP is not within 10 of normal BP 120/80. Pt still at 156/111 and EDP is aware.

## 2022-01-27 NOTE — ED Notes (Signed)
[facility] arrived to transport pt back to [facility]. This RN has been unsuccessful in reaching [facility]. Will attempt to call again.

## 2022-01-27 NOTE — ED Notes (Addendum)
This RN called pts legal guardian, Oseias Horsey, to inform her that we are not able to hold pt in ED per her request until she is able to find pt another facility to go to and that pt must return to [facility] where he currently resides. She expresses her understanding.

## 2022-01-27 NOTE — Progress Notes (Signed)
CSW spoke to facility about Pt returning, Pt vitals were still a little high however provider cleared Pt. Pt is good to return back to facility.

## 2022-01-27 NOTE — ED Provider Notes (Signed)
Care of patient assumed from Dr. Tami Ribas at 8 AM.  This patient with history of TBI requiring facility care, recently discharged and returned to his facility, brought back due to hypertension.  He was given his home blood pressure medicines here in the ED.  His blood pressure has remained elevated throughout the night.  Social worker has been consulted to help resolve this facility issue. Physical Exam  BP (!) 170/125   Pulse (!) 119   Temp 98.9 F (37.2 C) (Temporal)   Resp 18   Ht 6' (1.829 m)   Wt 80.3 kg   SpO2 99%   BMI 24.01 kg/m   Physical Exam Constitutional:      General: He is not in acute distress.    Appearance: He is normal weight. He is ill-appearing (Chronically).  HENT:     Mouth/Throat:     Comments: Dry lips, clenched jaw Cardiovascular:     Rate and Rhythm: Regular rhythm. Tachycardia present.  Pulmonary:     Effort: No respiratory distress.     Breath sounds: No wheezing or rales.  Abdominal:     General: Abdomen is flat. There is no distension.     Palpations: Abdomen is soft.     Tenderness: There is no abdominal tenderness.  Musculoskeletal:        General: No deformity.     Cervical back: Neck supple.     Right lower leg: No edema.     Left lower leg: No edema.     Comments: Clenched right hand  Skin:    General: Skin is warm.  Neurological:     GCS: GCS eye subscore is 4. GCS verbal subscore is 1. GCS motor subscore is 5.     Procedures  Procedures  ED Course / MDM    Medical Decision Making Risk OTC drugs. Prescription drug management.   Home medications were ordered.  Patient had improved blood pressures.  Social worker engaged with his facility, who does accept him back.  Patient was discharged in stable condition.       Gloris Manchester, MD 01/27/22 [phone]

## 2022-03-25 ENCOUNTER — Emergency Department (HOSPITAL_COMMUNITY): Payer: Medicaid Other

## 2022-03-25 ENCOUNTER — Encounter (HOSPITAL_COMMUNITY): Payer: Self-pay | Admitting: *Deleted

## 2022-03-25 ENCOUNTER — Other Ambulatory Visit: Payer: Self-pay

## 2022-03-25 ENCOUNTER — Emergency Department (HOSPITAL_COMMUNITY)
Admission: EM | Admit: 2022-03-25 | Discharge: 2022-03-25 | Disposition: A | Discharge status: Skilled Nursing Facility | Payer: Medicaid Other | Attending: Emergency Medicine | Admitting: Emergency Medicine

## 2022-03-25 DIAGNOSIS — Z794 Long term (current) use of insulin: Secondary | ICD-10-CM | POA: Insufficient documentation

## 2022-03-25 DIAGNOSIS — Z20822 Contact with and (suspected) exposure to covid-19: Secondary | ICD-10-CM | POA: Diagnosis not present

## 2022-03-25 DIAGNOSIS — Z7982 Long term (current) use of aspirin: Secondary | ICD-10-CM | POA: Insufficient documentation

## 2022-03-25 DIAGNOSIS — R059 Cough, unspecified: Secondary | ICD-10-CM | POA: Diagnosis present

## 2022-03-25 DIAGNOSIS — R051 Acute cough: Secondary | ICD-10-CM | POA: Insufficient documentation

## 2022-03-25 LAB — SARS CORONAVIRUS 2 BY RT PCR: SARS Coronavirus 2 by RT PCR: NEGATIVE

## 2022-03-25 NOTE — ED Triage Notes (Signed)
Pt brought in by [facility] for c/o productive cough per the facility  Pt has a g-tube and does not take anything by mouth. Family member was seen giving pt by mouth water  The staff reports pt started with a cough after the pt was given water

## 2022-03-25 NOTE — Discharge Instructions (Signed)
You were seen in the emergency department for evaluation of cough.  Your oxygenation was 100% and your chest x-ray did not show any pneumonia.  A COVID swab was done.  You can follow this result up in [place].  Please follow-up with your regular doctor.  Return to the emergency department if any worsening or concerning symptoms.

## 2022-03-25 NOTE — ED Provider Notes (Signed)
[facility] EMERGENCY DEPARTMENT Provider Note   CSN: [id number] Arrival date & time: 03/25/22  1354     History {Add pertinent medical, surgical, social history, OB history to HPI:1} No chief complaint on file.   Sean Bruce is a 26 y.o. male.  Level 5 caveat secondary to nonverbal.  Patient brought in from his facility for evaluation of cough.  Per triage note patient is not to take anything by mouth and family was giving him something to drink, followed by cough.  Patient himself unable to give any history.  No obvious distress.  History of traumatic brain injury and subdural.   The history is provided by the EMS personnel.  Cough Cough characteristics:  Unable to specify Sputum characteristics:  Unable to specify Progression:  Unchanged Relieved by:  None tried Worsened by:  Nothing Ineffective treatments:  None tried      Home Medications Prior to Admission medications   Medication Sig Start Date End Date Taking? Authorizing Provider  acetaminophen (TYLENOL) 500 MG tablet Place 2 tablets (1,000 mg total) into feeding tube every 6 (six) hours as needed for mild pain, moderate pain or fever (temp >101.5). Patient taking differently: Place 1,000 mg into feeding tube every 6 (six) hours as needed for fever (pain). 11/25/21   Adam Phenix, PA-C  amantadine (SYMMETREL) 100 MG capsule Place 1 capsule (100 mg total) into feeding tube 2 (two) times daily. 11/25/21   Adam Phenix, PA-C  Amino Acids-Protein Hydrolys (FEEDING SUPPLEMENT, PRO-STAT SUGAR FREE 64,) LIQD Place 30 mLs into feeding tube daily.    [provider]  aspirin 325 MG tablet Place 1 tablet (325 mg total) into feeding tube daily. 11/25/21   Adam Phenix, PA-C  bethanechol (URECHOLINE) 10 MG tablet Place 1 tablet (10 mg total) into feeding tube 3 (three) times daily. 11/25/21   Adam Phenix, PA-C  bisacodyl (DULCOLAX) 10 MG suppository Place 1 suppository (10 mg total) rectally daily.  11/25/21   Adam Phenix, PA-C  carvedilol (COREG) 25 MG tablet Place 1 tablet (25 mg total) into feeding tube 2 (two) times daily with a meal. 12/29/21 01/28/22  Sherryll Burger, Pratik D, DO  chlorhexidine (PERIDEX) 0.12 % solution 15 mLs by Mouth Rinse route 2 (two) times daily. 11/25/21   Adam Phenix, PA-C  cloNIDine (CATAPRES) 0.1 MG tablet Take 1 tablet (0.1 mg total) by mouth 3 (three) times daily. 12/29/21   Sherryll Burger, Pratik D, DO  docusate (COLACE) 50 MG/5ML liquid Place 10 mLs (100 mg total) into feeding tube 2 (two) times daily. 11/25/21   Adam Phenix, PA-C  enoxaparin (LOVENOX) 40 MG/0.4ML injection Inject 0.4 mLs (40 mg total) into the skin daily. 12/05/21   Tyrone Nine, MD  insulin aspart (NOVOLOG) 100 UNIT/ML injection Inject 0-15 Units into the skin every 4 (four) hours. Patient taking differently: Inject 0-12 Units into the skin every 4 (four) hours. Per sliding scale: 100 - 250 = 2 units 251 - 300 = 4 units 301 - 350 = 6 units 351 - 400 = 8 units 401 - 450 = 10 units 451 - 500 = 12 units 11/25/21   Simaan, Francine Graven, PA-C  levETIRAcetam (KEPPRA) 100 MG/ML solution Place 5 mLs (500 mg total) into feeding tube 2 (two) times daily. 11/25/21   Adam Phenix, PA-C  Nutritional Supplements (FEEDING SUPPLEMENT, OSMOLITE 1.5 CAL,) LIQD Place 1,000 mLs into feeding tube continuous. Run at 60 cc per hour continuous 01/26/22   Tat,  Shanon Brow, MD  Nutritional Supplements (FEEDING SUPPLEMENT, PROSOURCE TF,) liquid Place 45 mLs into feeding tube daily. 01/26/22   Orson Eva, MD  ondansetron (ZOFRAN) 4 MG tablet Place 1 tablet (4 mg total) into feeding tube every 6 (six) hours as needed for nausea. 11/25/21   Jill Alexanders, PA-C  polyethylene glycol (MIRALAX / GLYCOLAX) 17 g packet Place 17 g into feeding tube daily. 11/25/21   Jill Alexanders, PA-C  sulfamethoxazole-trimethoprim (BACTRIM DS) 800-160 MG tablet Take 1 tablet by mouth every 12 (twelve) hours. X 4 days 01/26/22   Orson Eva,  MD  Water For Irrigation, Sterile (FREE WATER) SOLN Place 100 mLs into feeding tube 4 (four) times daily. 11/25/21   Jill Alexanders, PA-C      Allergies    Patient has no known allergies.    Review of Systems   Review of Systems  Unable to perform ROS: Patient nonverbal  Respiratory:  Positive for cough.     Physical Exam Updated Vital Signs BP (!) 137/94 (BP Location: Right Arm)   Pulse 86   Temp 99.2 F (37.3 C) (Axillary)   Resp 19   Ht 6' (1.829 m)   Wt 80 kg   SpO2 100%   BMI 23.92 kg/m  Physical Exam Vitals and nursing note reviewed.  Constitutional:      General: He is not in acute distress.    Appearance: Normal appearance. He is well-developed.  HENT:     Head: Atraumatic.     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     Heart sounds: No murmur heard. Pulmonary:     Effort: Pulmonary effort is normal. No respiratory distress.     Breath sounds: Normal breath sounds.  Abdominal:     Palpations: Abdomen is soft.     Tenderness: There is no abdominal tenderness. There is no guarding or rebound.     Comments: PEG tube left upper quadrant  Musculoskeletal:     Cervical back: Neck supple.     Comments: Patient with contractures upper extremities all 4 extremities in braces or padding  Skin:    General: Skin is warm and dry.  Neurological:     Comments: Patient is minimally responsive to voice, sometimes opens his eyes.  Not following any commands otherwise.     ED Results / Procedures / Treatments   Labs (all labs ordered are listed, but only abnormal results are displayed) Labs Reviewed - No data to display  EKG None  Radiology No results found.  Procedures Procedures  {Document cardiac monitor, telemetry assessment procedure when appropriate:1}  Medications Ordered in ED Medications - No data to display  ED Course/ Medical Decision  Making/ A&P                           Medical Decision Making Amount and/or Complexity of Data Reviewed Radiology: ordered.   This patient complains of ***; this involves an extensive number of treatment Options and is a complaint that carries with it a high risk of complications and morbidity. The differential includes ***  I ordered, reviewed and interpreted labs, which included *** I ordered medication *** and reviewed PMP when indicated. I ordered imaging studies which included *** and I independently    visualized and interpreted imaging which showed *** Additional history obtained from ***  Previous records obtained and reviewed *** I consulted *** and discussed lab and imaging findings and discussed disposition.  Cardiac monitoring reviewed, *** Social determinants considered, *** Critical Interventions: ***  After the interventions stated above, I reevaluated the patient and found *** Admission and further testing considered, ***   {Document critical care time when appropriate:1} {Document review of labs and clinical decision tools ie heart score, Chads2Vasc2 etc:1}  {Document your independent review of radiology images, and any outside records:1} {Document your discussion with family members, caretakers, and with consultants:1} {Document social determinants of health affecting pt's care:1} {Document your decision making why or why not admission, treatments were needed:1} Final Clinical Impression(s) / ED Diagnoses Final diagnoses:  None    Rx / DC Orders ED Discharge Orders     None

## 2022-03-25 NOTE — ED Notes (Signed)
Report called to staff at [facility]

## 2022-05-15 ENCOUNTER — Emergency Department (HOSPITAL_COMMUNITY): Payer: Medicaid Other

## 2022-05-15 ENCOUNTER — Inpatient Hospital Stay (HOSPITAL_COMMUNITY)
Admission: EM | Admit: 2022-05-15 | Discharge: 2022-05-20 | DRG: 871 | Disposition: A | Discharge status: Skilled Nursing Facility | Payer: Medicaid Other | Source: Skilled Nursing Facility | Attending: Internal Medicine | Admitting: Internal Medicine

## 2022-05-15 ENCOUNTER — Other Ambulatory Visit: Payer: Self-pay

## 2022-05-15 ENCOUNTER — Encounter (HOSPITAL_COMMUNITY): Payer: Self-pay | Admitting: Emergency Medicine

## 2022-05-15 DIAGNOSIS — A419 Sepsis, unspecified organism: Secondary | ICD-10-CM | POA: Diagnosis present

## 2022-05-15 DIAGNOSIS — G40901 Epilepsy, unspecified, not intractable, with status epilepticus: Secondary | ICD-10-CM | POA: Diagnosis present

## 2022-05-15 DIAGNOSIS — R403 Persistent vegetative state: Secondary | ICD-10-CM | POA: Diagnosis present

## 2022-05-15 DIAGNOSIS — Z794 Long term (current) use of insulin: Secondary | ICD-10-CM

## 2022-05-15 DIAGNOSIS — E119 Type 2 diabetes mellitus without complications: Secondary | ICD-10-CM | POA: Diagnosis present

## 2022-05-15 DIAGNOSIS — N200 Calculus of kidney: Secondary | ICD-10-CM | POA: Diagnosis present

## 2022-05-15 DIAGNOSIS — R809 Proteinuria, unspecified: Secondary | ICD-10-CM | POA: Diagnosis present

## 2022-05-15 DIAGNOSIS — Z931 Gastrostomy status: Secondary | ICD-10-CM | POA: Diagnosis not present

## 2022-05-15 DIAGNOSIS — Z7982 Long term (current) use of aspirin: Secondary | ICD-10-CM

## 2022-05-15 DIAGNOSIS — J690 Pneumonitis due to inhalation of food and vomit: Secondary | ICD-10-CM | POA: Diagnosis present

## 2022-05-15 DIAGNOSIS — I10 Essential (primary) hypertension: Secondary | ICD-10-CM | POA: Diagnosis present

## 2022-05-15 DIAGNOSIS — E876 Hypokalemia: Secondary | ICD-10-CM | POA: Diagnosis present

## 2022-05-15 DIAGNOSIS — R1112 Projectile vomiting: Secondary | ICD-10-CM | POA: Diagnosis present

## 2022-05-15 DIAGNOSIS — Z79899 Other long term (current) drug therapy: Secondary | ICD-10-CM

## 2022-05-15 DIAGNOSIS — Z87820 Personal history of traumatic brain injury: Secondary | ICD-10-CM

## 2022-05-15 DIAGNOSIS — R569 Unspecified convulsions: Secondary | ICD-10-CM | POA: Diagnosis not present

## 2022-05-15 DIAGNOSIS — G40919 Epilepsy, unspecified, intractable, without status epilepticus: Secondary | ICD-10-CM | POA: Diagnosis not present

## 2022-05-15 LAB — LIPASE, BLOOD: Lipase: 22 U/L (ref 11–51)

## 2022-05-15 LAB — CBC WITH DIFFERENTIAL/PLATELET
Band Neutrophils: 5 %
Basophils Relative: 0 %
Eosinophils Relative: 0 %
HCT: 56 % — ABNORMAL HIGH (ref 39.0–52.0)
Hemoglobin: 18.4 g/dL — ABNORMAL HIGH (ref 13.0–17.0)
Lymphocytes Relative: 8 %
MCH: 29.3 pg (ref 26.0–34.0)
MCHC: 32.9 g/dL (ref 30.0–36.0)
MCV: 89.2 fL (ref 80.0–100.0)
Monocytes Relative: 9 %
Neutrophils Relative %: 78 %
Platelets: 230 10*3/uL (ref 150–400)
RBC: 6.28 MIL/uL — ABNORMAL HIGH (ref 4.22–5.81)
RDW: 14.6 % (ref 11.5–15.5)
WBC: 15.4 10*3/uL — ABNORMAL HIGH (ref 4.0–10.5)
nRBC: 0 % (ref 0.0–0.2)

## 2022-05-15 LAB — COMPREHENSIVE METABOLIC PANEL
ALT: 35 U/L (ref 0–44)
AST: 21 U/L (ref 15–41)
Albumin: 4.3 g/dL (ref 3.5–5.0)
Alkaline Phosphatase: 157 U/L — ABNORMAL HIGH (ref 38–126)
Anion gap: 15 (ref 5–15)
BUN: 31 mg/dL — ABNORMAL HIGH (ref 6–20)
CO2: 29 mmol/L (ref 22–32)
Calcium: 11.2 mg/dL — ABNORMAL HIGH (ref 8.9–10.3)
Chloride: 101 mmol/L (ref 98–111)
Creatinine, Ser: 0.96 mg/dL (ref 0.61–1.24)
GFR, Estimated: >60 mL/min (ref >60)
Glucose, Bld: 126 mg/dL — ABNORMAL HIGH (ref 70–99)
Potassium: 4.5 mmol/L (ref 3.5–5.1)
Sodium: 145 mmol/L (ref 135–145)
Total Bilirubin: 1.6 mg/dL — ABNORMAL HIGH (ref 0.3–1.2)
Total Protein: 8.8 g/dL — ABNORMAL HIGH (ref 6.5–8.1)

## 2022-05-15 LAB — URINALYSIS, ROUTINE W REFLEX MICROSCOPIC
Bacteria, UA: NONE SEEN
Bilirubin Urine: NEGATIVE
Glucose, UA: NEGATIVE mg/dL
Ketones, ur: 5 mg/dL — AB
Leukocytes,Ua: NEGATIVE
Nitrite: NEGATIVE
Protein, ur: 100 mg/dL — AB
RBC / HPF: >50 RBC/hpf — ABNORMAL HIGH (ref 0–5)
Specific Gravity, Urine: 1.043 — ABNORMAL HIGH (ref 1.005–1.030)
WBC, UA: >50 WBC/hpf — ABNORMAL HIGH (ref 0–5)
pH: 6 (ref 5.0–8.0)

## 2022-05-15 LAB — LACTIC ACID, PLASMA
Lactic Acid, Venous: 1.5 mmol/L (ref 0.5–1.9)
Lactic Acid, Venous: 1.5 mmol/L (ref 0.5–1.9)

## 2022-05-15 MED ORDER — ONDANSETRON HCL 4 MG/2ML IJ SOLN: 4.0000 mg | Freq: Four times a day (QID) | INTRAMUSCULAR | Status: DC | PRN

## 2022-05-15 MED ORDER — METOPROLOL TARTRATE 5 MG/5ML IV SOLN
5.0000 mg | Freq: Once | INTRAVENOUS | Status: AC
  Administered 2022-05-15: 5 mg via INTRAVENOUS
  Filled 2022-05-15: qty 5

## 2022-05-15 MED ORDER — LACTATED RINGERS IV SOLN: INTRAVENOUS | Status: DC

## 2022-05-15 MED ORDER — SODIUM CHLORIDE 0.9 % IV BOLUS
1000.0000 mL | Freq: Once | INTRAVENOUS | Status: AC
  Administered 2022-05-15: 1000 mL via INTRAVENOUS

## 2022-05-15 MED ORDER — BISACODYL 10 MG RE SUPP
10.0000 mg | Freq: Every day | RECTAL | Status: DC
  Administered 2022-05-16 – 2022-05-20 (×4): 10 mg via RECTAL
  Filled 2022-05-15 (×5): qty 1

## 2022-05-15 MED ORDER — LACTATED RINGERS IV BOLUS
1000.0000 mL | Freq: Once | INTRAVENOUS | Status: AC
  Administered 2022-05-15: 1000 mL via INTRAVENOUS

## 2022-05-15 MED ORDER — CLONIDINE HCL 0.1 MG PO TABS
0.1000 mg | ORAL_TABLET | Freq: Three times a day (TID) | ORAL | Status: DC
  Filled 2022-05-15: qty 1

## 2022-05-15 MED ORDER — SODIUM CHLORIDE 0.9 % IV SOLN
3.0000 g | Freq: Once | INTRAVENOUS | Status: AC
  Administered 2022-05-15: 3 g via INTRAVENOUS
  Filled 2022-05-15: qty 8

## 2022-05-15 MED ORDER — ENOXAPARIN SODIUM 40 MG/0.4ML IJ SOSY: 40.0000 mg | PREFILLED_SYRINGE | INTRAMUSCULAR | Status: DC

## 2022-05-15 MED ORDER — LABETALOL HCL 5 MG/ML IV SOLN
10.0000 mg | INTRAVENOUS | Status: DC | PRN
  Administered 2022-05-15 – 2022-05-20 (×6): 10 mg via INTRAVENOUS
  Filled 2022-05-15 (×6): qty 4

## 2022-05-15 MED ORDER — POLYETHYLENE GLYCOL 3350 17 G PO PACK
17.0000 g | PACK | Freq: Every day | ORAL | Status: DC
  Administered 2022-05-16 – 2022-05-20 (×4): 17 g
  Filled 2022-05-15 (×5): qty 1

## 2022-05-15 MED ORDER — ONDANSETRON HCL 4 MG/2ML IJ SOLN
4.0000 mg | Freq: Once | INTRAMUSCULAR | Status: AC
  Administered 2022-05-15: 4 mg via INTRAVENOUS
  Filled 2022-05-15: qty 2

## 2022-05-15 MED ORDER — DOCUSATE SODIUM 50 MG/5ML PO LIQD
100.0000 mg | Freq: Two times a day (BID) | ORAL | Status: DC
  Administered 2022-05-15 – 2022-05-20 (×8): 100 mg
  Filled 2022-05-15 (×13): qty 10

## 2022-05-15 MED ORDER — LEVETIRACETAM 100 MG/ML PO SOLN
500.0000 mg | Freq: Two times a day (BID) | ORAL | Status: DC
  Administered 2022-05-15 – 2022-05-18 (×7): 500 mg
  Filled 2022-05-15 (×12): qty 5

## 2022-05-15 MED ORDER — FREE WATER
100.0000 mL | Freq: Four times a day (QID) | Status: DC
  Administered 2022-05-15 – 2022-05-16 (×5): 100 mL

## 2022-05-15 MED ORDER — ASPIRIN 325 MG PO TABS
325.0000 mg | ORAL_TABLET | Freq: Every day | ORAL | Status: DC
  Administered 2022-05-16 – 2022-05-20 (×5): 325 mg
  Filled 2022-05-15 (×5): qty 1

## 2022-05-15 MED ORDER — CHLORHEXIDINE GLUCONATE 0.12 % MT SOLN
15.0000 mL | Freq: Two times a day (BID) | OROMUCOSAL | Status: DC
  Administered 2022-05-15 – 2022-05-20 (×10): 15 mL via OROMUCOSAL
  Filled 2022-05-15 (×11): qty 15

## 2022-05-15 MED ORDER — ACETAMINOPHEN 325 MG PO TABS
650.0000 mg | ORAL_TABLET | Freq: Four times a day (QID) | ORAL | Status: DC | PRN
  Administered 2022-05-15: 650 mg via ORAL
  Filled 2022-05-15: qty 2

## 2022-05-15 MED ORDER — SODIUM CHLORIDE 0.9 % IV SOLN
3.0000 g | Freq: Four times a day (QID) | INTRAVENOUS | Status: DC
  Administered 2022-05-15 – 2022-05-20 (×20): 3 g via INTRAVENOUS
  Filled 2022-05-15 (×20): qty 8

## 2022-05-15 MED ORDER — BETHANECHOL CHLORIDE 10 MG PO TABS
10.0000 mg | ORAL_TABLET | Freq: Three times a day (TID) | ORAL | Status: DC
  Administered 2022-05-15 – 2022-05-20 (×15): 10 mg
  Filled 2022-05-15 (×18): qty 1

## 2022-05-15 MED ORDER — AMANTADINE HCL 100 MG PO CAPS
100.0000 mg | ORAL_CAPSULE | Freq: Two times a day (BID) | ORAL | Status: DC
  Administered 2022-05-15 – 2022-05-20 (×10): 100 mg
  Filled 2022-05-15 (×10): qty 1

## 2022-05-15 MED ORDER — IOHEXOL 300 MG/ML  SOLN
100.0000 mL | Freq: Once | INTRAMUSCULAR | Status: AC | PRN
  Administered 2022-05-15: 100 mL via INTRAVENOUS

## 2022-05-15 MED ORDER — CLONIDINE HCL 0.2 MG/24HR TD PTWK
0.2000 mg | MEDICATED_PATCH | TRANSDERMAL | Status: DC
  Administered 2022-05-15: 0.2 mg via TRANSDERMAL
  Filled 2022-05-15 (×2): qty 1

## 2022-05-15 MED ORDER — ENOXAPARIN SODIUM 40 MG/0.4ML IJ SOSY
40.0000 mg | PREFILLED_SYRINGE | INTRAMUSCULAR | Status: DC
  Administered 2022-05-15 – 2022-05-19 (×5): 40 mg via SUBCUTANEOUS
  Filled 2022-05-15 (×5): qty 0.4

## 2022-05-15 MED ORDER — ONDANSETRON HCL 4 MG PO TABS: 4.0000 mg | ORAL_TABLET | Freq: Four times a day (QID) | ORAL | Status: DC | PRN

## 2022-05-15 MED ORDER — ACETAMINOPHEN 650 MG RE SUPP: 650.0000 mg | Freq: Four times a day (QID) | RECTAL | Status: DC | PRN
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
## 2022-05-15 NOTE — ED Notes (Signed)
Legal guardian called and updated

## 2022-05-15 NOTE — Progress Notes (Deleted)
   05/15/22 1400  Assess: MEWS Score  Temp 99.2 F (37.3 C)  BP (!) 158/113  Pulse Rate (!) 124  Resp 20  SpO2 100 %  O2 Device Room Air  Assess: MEWS Score  MEWS Temp 0  MEWS Systolic 0  MEWS Pulse 2  MEWS RR 0  MEWS LOC 0  MEWS Score 2  MEWS Score Color Yellow  Assess: if the MEWS score is Yellow or Red  Were vital signs taken at a resting state? Yes  Focused Assessment No change from prior assessment  Does the patient meet 2 or more of the SIRS criteria? Yes  Does the patient have a confirmed or suspected source of infection? Yes  Provider and Rapid Response Notified? Yes  MEWS guidelines implemented *See Row Information* No, previously yellow, continue vital signs every 4 hours  Treat  Pain Scale Faces  Faces Pain Scale 2  Notify: Charge Nurse/RN  Date Charge Nurse/RN Notified 05/15/22  Provider Notification  Provider Name/Title MD Sherryll Burger  Date Provider Notified 05/15/22  Assess: SIRS CRITERIA  SIRS Temperature  0  SIRS Pulse 1  SIRS Respirations  0  SIRS WBC 1  SIRS Score Sum  2

## 2022-05-15 NOTE — ED Notes (Signed)
Unable to find IV. Will notify next RN for USIV

## 2022-05-15 NOTE — Progress Notes (Signed)
Patient with healing ulcer to sacrum, pink non blanching skin exposed with very dry skin surrounding. Sacral foam dressing applied and area shown to family member. Patient has on bunny boots from the nursing home. Patient feet along the outer side are with redness, some areas blanch, while others do not. Area traces length of foot. No open or exposed areas to feet.  Patient's right hand has a brace as he tends to keep his hand in a fist. Hand examined, the knuckles along the right hand each have small blister. No open or draining areas. Patient has healing scar to mid throat from trach site, and several scars along the right side of his head. He also has an old scar area to the right side.
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
## 2022-05-15 NOTE — Progress Notes (Signed)
   05/15/22 1558  Assess: MEWS Score  BP (!) 154/108  Pulse Rate (!) 118  Assess: MEWS Score  MEWS Temp 0  MEWS Systolic 0  MEWS Pulse 2  MEWS RR 0  MEWS LOC 0  MEWS Score 2  MEWS Score Color Yellow  Assess: if the MEWS score is Yellow or Red  Were vital signs taken at a resting state? Yes  Focused Assessment No change from prior assessment  Does the patient meet 2 or more of the SIRS criteria? Yes  Does the patient have a confirmed or suspected source of infection? Yes  MEWS guidelines implemented *See Row Information* No, previously yellow, continue vital signs every 4 hours  Treat  Pain Scale Faces  Faces Pain Scale 2  Provider Notification  Provider Name/Title MS Sherryll Burger  Date Provider Notified 05/15/22  Time Provider Notified 1559  Assess: SIRS CRITERIA  SIRS Temperature  0  SIRS Pulse 1  SIRS Respirations  0  SIRS WBC 1  SIRS Score Sum  2

## 2022-05-15 NOTE — Progress Notes (Signed)
Pharmacy Antibiotic Note  Sean Bruce is a 26 y.o. male admitted on 05/15/2022 with aspiration pneumonia.  Pharmacy has been consulted for Unasyn dosing.  Plan: Unasyn 3gm IV q6h F/U cxs, clinical progress Monitor V/S , labs  Height: 6' (182.9 cm) Weight: 80 kg (176 lb 5.9 oz) IBW/kg (Calculated) : 77.6  Temp (24hrs), Avg:98.4 F (36.9 C), Min:98.4 F (36.9 C), Max:98.4 F (36.9 C)  Recent Labs  Lab 05/15/22 0736  WBC 15.4*  CREATININE 0.96    Estimated Creatinine Clearance: 125.7 mL/min (by C-G formula based on SCr of 0.96 mg/dL).    No Known Allergies  Antimicrobials this admission: unasyn 11/16 >>   Microbiology results: 11/16 BCx: pending  Thank you for allowing pharmacy to be a part of this patient's care.  Elder Cyphers, BS Pharm D, BCPS Clinical Pharmacist 05/15/2022 12:26 PM

## 2022-05-15 NOTE — ED Provider Notes (Signed)
[facility] EMERGENCY DEPARTMENT Provider Note   CSN: [id number] Arrival date & time: 05/15/22  [id number]     History  Chief Complaint  Patient presents with   Emesis    Sean Bruce is a 26 y.o. male.  Patient stays at a nursing home and has a PEG tube.  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  Patient had vomiting  The history is provided by the nursing home and the EMS personnel. No language interpreter was used.  Emesis Severity:  Mild Timing:  Intermittent Quality:  Bilious material Able to tolerate:  Liquids Progression:  Worsening Chronicity:  New Recent urination:  Normal Context: not post-tussive   Relieved by:  Nothing Worsened by:  Nothing Ineffective treatments:  None tried Associated symptoms: no abdominal pain, no cough, no diarrhea and no headaches        Home Medications Prior to Admission medications   Medication Sig Start Date End Date Taking? Authorizing Provider  acetaminophen (TYLENOL) 500 MG tablet Place 2 tablets (1,000 mg total) into feeding tube every 6 (six) hours as needed for mild pain, moderate pain or fever (temp >101.5). Patient taking differently: Place 1,000 mg into feeding tube every 6 (six) hours as needed for fever (pain). 11/25/21   Jill Alexanders, PA-C  amantadine (SYMMETREL) 100 MG capsule Place 1 capsule (100 mg total) into feeding tube 2 (two) times daily. 11/25/21   Jill Alexanders, PA-C  Amino Acids-Protein Hydrolys (FEEDING SUPPLEMENT, PRO-STAT SUGAR FREE 64,) LIQD Place 30 mLs into feeding tube daily.    [provider]  aspirin 325 MG tablet Place 1 tablet (325 mg total) into feeding tube daily. 11/25/21   Jill Alexanders, PA-C  bethanechol (URECHOLINE) 10 MG tablet Place 1 tablet (10 mg total) into feeding tube 3 (three) times daily. 11/25/21   Jill Alexanders, PA-C  bisacodyl (DULCOLAX) 10 MG suppository Place 1 suppository (10 mg total) rectally daily. 11/25/21   Jill Alexanders, PA-C  carvedilol  (COREG) 25 MG tablet Place 1 tablet (25 mg total) into feeding tube 2 (two) times daily with a meal. 12/29/21 03/25/22  Manuella Ghazi, Pratik D, DO  chlorhexidine (PERIDEX) 0.12 % solution 15 mLs by Mouth Rinse route 2 (two) times daily. 11/25/21   Jill Alexanders, PA-C  cloNIDine (CATAPRES) 0.1 MG tablet Take 1 tablet (0.1 mg total) by mouth 3 (three) times daily. 12/29/21   Manuella Ghazi, Pratik D, DO  docusate (COLACE) 50 MG/5ML liquid Place 10 mLs (100 mg total) into feeding tube 2 (two) times daily. 11/25/21   Jill Alexanders, PA-C  enoxaparin (LOVENOX) 40 MG/0.4ML injection Inject 0.4 mLs (40 mg total) into the skin daily. Patient not taking: Reported on 03/25/2022 12/05/21   Patrecia Pour, MD  insulin aspart (NOVOLOG) 100 UNIT/ML injection Inject 0-15 Units into the skin every 4 (four) hours. Patient taking differently: Inject 0-12 Units into the skin every 4 (four) hours. Per sliding scale: 100 - 250 = 2 units 251 - 300 = 4 units 301 - 350 = 6 units 351 - 400 = 8 units 401 - 450 = 10 units 451 - 500 = 12 units 11/25/21   Simaan, Darci Current, PA-C  levETIRAcetam (KEPPRA) 100 MG/ML solution Place 5 mLs (500 mg total) into feeding tube 2 (two) times daily. 11/25/21   Jill Alexanders, PA-C  Nutritional Supplements (FEEDING SUPPLEMENT, OSMOLITE 1.5 CAL,) LIQD Place 1,000 mLs into feeding tube continuous. Run at 60 cc per hour continuous  01/26/22   Catarina Hartshorn, MD  Nutritional Supplements (FEEDING SUPPLEMENT, PROSOURCE TF,) liquid Place 45 mLs into feeding tube daily. Patient not taking: Reported on 03/25/2022 01/26/22   Catarina Hartshorn, MD  ondansetron (ZOFRAN) 4 MG tablet Place 1 tablet (4 mg total) into feeding tube every 6 (six) hours as needed for nausea. 11/25/21   Adam Phenix, PA-C  polyethylene glycol (MIRALAX / GLYCOLAX) 17 g packet Place 17 g into feeding tube daily. 11/25/21   Adam Phenix, PA-C  sulfamethoxazole-trimethoprim (BACTRIM DS) 800-160 MG tablet Take 1 tablet by mouth every 12 (twelve)  hours. X 4 days Patient not taking: Reported on 03/25/2022 01/26/22   Catarina Hartshorn, MD  Water For Irrigation, Sterile (FREE WATER) SOLN Place 100 mLs into feeding tube 4 (four) times daily. 11/25/21   Adam Phenix, PA-C      Allergies    Patient has no known allergies.    Review of Systems   Review of Systems  Unable to perform ROS: Mental status change  Constitutional:  Negative for appetite change and fatigue.  HENT:  Negative for congestion, ear discharge and sinus pressure.   Eyes:  Negative for discharge.  Respiratory:  Negative for cough.   Cardiovascular:  Negative for chest pain.  Gastrointestinal:  Positive for vomiting. Negative for abdominal pain and diarrhea.  Genitourinary:  Negative for frequency and hematuria.  Musculoskeletal:  Negative for back pain.  Skin:  Negative for rash.  Neurological:  Negative for seizures and headaches.  Psychiatric/Behavioral:  Negative for hallucinations.     Physical Exam Updated Vital Signs BP (!) 159/104   Pulse (!) 129   Temp 98.4 F (36.9 C)   Resp 20   Ht 6' (1.829 m)   Wt 80 kg   SpO2 96%   BMI 23.92 kg/m  Physical Exam Vitals and nursing note reviewed.  Constitutional:      Appearance: He is well-developed.  HENT:     Head: Normocephalic.     Right Ear: External ear normal.  Eyes:     General: No scleral icterus.    Conjunctiva/sclera: Conjunctivae normal.  Neck:     Thyroid: No thyromegaly.  Cardiovascular:     Rate and Rhythm: Normal rate and regular rhythm.     Heart sounds: No murmur heard.    No friction rub. No gallop.  Pulmonary:     Breath sounds: No stridor. No wheezing or rales.  Chest:     Chest wall: No tenderness.  Abdominal:     General: There is no distension.     Tenderness: There is no abdominal tenderness. There is no rebound.     Comments: PEG tube in place  Musculoskeletal:        General: Normal range of motion.     Cervical back: Neck supple.  Lymphadenopathy:     Cervical: No  cervical adenopathy.  Skin:    Findings: No erythema or rash.  Neurological:     Motor: No abnormal muscle tone.     Coordination: Coordination normal.     Comments: Patient is bedridden and nonverbal this is his norm     ED Results / Procedures / Treatments   Labs (all labs ordered are listed, but only abnormal results are displayed) Labs Reviewed  CBC WITH DIFFERENTIAL/PLATELET - Abnormal; Notable for the following components:      Result Value   WBC 15.4 (*)    RBC 6.28 (*)    Hemoglobin 18.4 (*)  HCT 56.0 (*)    All other components within normal limits  COMPREHENSIVE METABOLIC PANEL - Abnormal; Notable for the following components:   Glucose, Bld 126 (*)    BUN 31 (*)    Calcium 11.2 (*)    Total Protein 8.8 (*)    Alkaline Phosphatase 157 (*)    Total Bilirubin 1.6 (*)    All other components within normal limits  URINALYSIS, ROUTINE W REFLEX MICROSCOPIC - Abnormal; Notable for the following components:   Color, Urine AMBER (*)    APPearance HAZY (*)    Specific Gravity, Urine 1.043 (*)    Hgb urine dipstick LARGE (*)    Ketones, ur 5 (*)    Protein, ur 100 (*)    RBC / HPF >50 (*)    WBC, UA >50 (*)    All other components within normal limits  CULTURE, BLOOD (ROUTINE X 2)  CULTURE, BLOOD (ROUTINE X 2)  LIPASE, BLOOD  LACTIC ACID, PLASMA  LACTIC ACID, PLASMA    EKG None  Radiology CT ABDOMEN PELVIS W CONTRAST  Result Date: 05/15/2022 CLINICAL DATA:  Abdominal pain, vomiting EXAM: CT ABDOMEN AND PELVIS WITH CONTRAST TECHNIQUE: Multidetector CT imaging of the abdomen and pelvis was performed using the standard protocol following bolus administration of intravenous contrast. RADIATION DOSE REDUCTION: This exam was performed according to the departmental dose-optimization program which includes automated exposure control, adjustment of the mA and/or kV according to patient size and/or use of iterative reconstruction technique. CONTRAST:  162mL OMNIPAQUE  IOHEXOL 300 MG/ML  SOLN COMPARISON:  12/02/2021 FINDINGS: Lower chest: Extensive patchy alveolar infiltrates are seen in left lower lobe. There are small patchy infiltrates in right lower lobe. Breathing motion limits evaluation of lower lung fields. Hepatobiliary: No focal abnormalities are seen in liver. There is no dilation of bile ducts. Gallbladder is unremarkable. Pancreas: Unremarkable. Spleen: Unremarkable. Adrenals/Urinary Tract: Adrenals are unremarkable. There is no hydronephrosis. There are a few high density foci in the collecting system in the left kidney largest in the lower pole measuring 5 mm. There is 11 mm linear metallic density in the left side of the urinary bladder close to the left ureterovesical junction. Stomach/Bowel: Stomach is distended with fluid. Percutaneous gastrostomy tube is noted in place. Small bowel loops are not dilated. Appendix is not dilated. There is no pericecal inflammation. There is no significant wall thickening in colon. Vascular/Lymphatic: Unremarkable. Reproductive: Unremarkable. Other: There is no ascites or pneumoperitoneum. 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 IMPRESSION: There is large infiltrate in left lower lobe suggesting pneumonia/aspiration. There are small patchy infiltrates in right lower lobe suggesting pneumonia/aspiration. There is no evidence of intestinal obstruction or pneumoperitoneum. There is no hydronephrosis. There are small high density foci in the collecting system in the left kidney which were not evident in the previous examination. This finding may either suggest nonobstructing left renal stones or early excretion of contrast into the collecting system. There is new linear metallic density at the level of left ureterovesical junction in the bladder. This may suggest calculus or interval intervention. Please correlate with clinical history. Other findings as described in  the body of the report. Electronically Signed   By: Elmer Picker M.D.   On: 05/15/2022 10:26   DG Chest Portable 1 View  Result Date: 05/15/2022 CLINICAL DATA:  26 year old male with history of projectile vomiting. Possible aspiration. EXAM: PORTABLE CHEST 1 VIEW COMPARISON:  Chest x-ray 03/25/2022. FINDINGS: Lung  volumes are low. Ill-defined left basilar retrocardiac opacity. No pleural effusions. No pneumothorax. No pulmonary nodule or mass noted. Pulmonary vasculature and the cardiomediastinal silhouette are within normal limits. IMPRESSION: 1. Ill-defined left lower lobe opacity which may reflect atelectasis and/or consolidation. Electronically Signed   By: Vinnie Langton M.D.   On: 05/15/2022 06:48    Procedures Procedures    Medications Ordered in ED Medications  Ampicillin-Sulbactam (UNASYN) 3 g in sodium chloride 0.9 % 100 mL IVPB (has no administration in time range)  ondansetron (ZOFRAN) injection 4 mg (4 mg Intravenous Given 05/15/22 0739)  lactated ringers bolus 1,000 mL (0 mLs Intravenous Stopped 05/15/22 1026)  sodium chloride 0.9 % bolus 1,000 mL (0 mLs Intravenous Stopped 05/15/22 1026)  sodium chloride 0.9 % bolus 1,000 mL (1,000 mLs Intravenous New Bag/Given 05/15/22 1025)  iohexol (OMNIPAQUE) 300 MG/ML solution 100 mL (100 mLs Intravenous Contrast Given 05/15/22 0932)    ED Course/ Medical Decision Making/ A&P  CRITICAL CARE Performed by: Milton Ferguson Total critical care time: 45 minutes Critical care time was exclusive of separately billable procedures and treating other patients. Critical care was necessary to treat or prevent imminent or life-threatening deterioration. Critical care was time spent personally by me on the following activities: development of treatment plan with patient and/or surrogate as well as nursing, discussions with consultants, evaluation of patient's response to treatment, examination of patient, obtaining history from patient or  surrogate, ordering and performing treatments and interventions, ordering and review of laboratory studies, ordering and review of radiographic studies, pulse oximetry and re-evaluation of patient's condition.                          Medical Decision Making Amount and/or Complexity of Data Reviewed Labs: ordered. Radiology: ordered.  Risk Prescription drug management. Decision regarding hospitalization.  This patient presents to the ED for concern of vomiting, this involves an extensive number of treatment options, and is a complaint that carries with it a high risk of complications and morbidity.  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  The pertinent results include: Urinalysis suggest infection, BUN 31 creatinine 0.96, white count 15,000   Imaging Studies ordered:  I ordered imaging studies including chest x-ray I independently visualized and interpreted imaging which showed pneumonia I agree with the radiologist interpretation   Cardiac Monitoring: / EKG:  The patient was maintained on a cardiac monitor.  I personally viewed and interpreted the cardiac monitored which showed an underlying rhythm of: Sinus tach   Consultations Obtained:  I requested consult from the hospitalist and they will admit the patient Problem List / ED Course / Critical interventions / Medication management  Sepsis and brain injury I ordered medication including antibiotics and fluids for sepsis Reevaluation of the patient after these medicines showed that the patient improved I have reviewed the patients home medicines and have made adjustments  as needed   Social Determinants of Health:  Nursing home patient   Test / Admission - Considered:  None; Patient with possible sepsis from aspiration pneumonia.  He will be admitted to medicine        Final Clinical Impression(s) / ED Diagnoses Final diagnoses:  None    Rx / DC Orders ED Discharge Orders     None         Milton Ferguson, MD 05/16/22 1717

## 2022-05-16 DIAGNOSIS — A419 Sepsis, unspecified organism: Principal | ICD-10-CM

## 2022-05-16 LAB — CBC
HCT: 41.6 % (ref 39.0–52.0)
Hemoglobin: 13.3 g/dL (ref 13.0–17.0)
MCH: 29.6 pg (ref 26.0–34.0)
MCHC: 32 g/dL (ref 30.0–36.0)
MCV: 92.7 fL (ref 80.0–100.0)
Platelets: 152 10*3/uL (ref 150–400)
RBC: 4.49 MIL/uL (ref 4.22–5.81)
RDW: 14.3 % (ref 11.5–15.5)
WBC: 11.5 10*3/uL — ABNORMAL HIGH (ref 4.0–10.5)
nRBC: 0 % (ref 0.0–0.2)

## 2022-05-16 LAB — COMPREHENSIVE METABOLIC PANEL
ALT: 22 U/L (ref 0–44)
AST: 13 U/L — ABNORMAL LOW (ref 15–41)
Albumin: 3 g/dL — ABNORMAL LOW (ref 3.5–5.0)
Alkaline Phosphatase: 96 U/L (ref 38–126)
Anion gap: 9 (ref 5–15)
BUN: 17 mg/dL (ref 6–20)
CO2: 25 mmol/L (ref 22–32)
Calcium: 9.2 mg/dL (ref 8.9–10.3)
Chloride: 113 mmol/L — ABNORMAL HIGH (ref 98–111)
Creatinine, Ser: 0.68 mg/dL (ref 0.61–1.24)
GFR, Estimated: >60 mL/min (ref >60)
Glucose, Bld: 93 mg/dL (ref 70–99)
Potassium: 3 mmol/L — ABNORMAL LOW (ref 3.5–5.1)
Sodium: 147 mmol/L — ABNORMAL HIGH (ref 135–145)
Total Bilirubin: 1 mg/dL (ref 0.3–1.2)
Total Protein: 6 g/dL — ABNORMAL LOW (ref 6.5–8.1)

## 2022-05-16 LAB — LACTIC ACID, PLASMA: Lactic Acid, Venous: 0.8 mmol/L (ref 0.5–1.9)

## 2022-05-16 LAB — MAGNESIUM: Magnesium: 1.8 mg/dL (ref 1.7–2.4)

## 2022-05-16 LAB — MRSA NEXT GEN BY PCR, NASAL: MRSA by PCR Next Gen: DETECTED — AB

## 2022-05-16 MED ORDER — CHLORHEXIDINE GLUCONATE CLOTH 2 % EX PADS
6.0000 | MEDICATED_PAD | Freq: Every day | CUTANEOUS | Status: DC
  Administered 2022-05-17 – 2022-05-20 (×4): 6 via TOPICAL

## 2022-05-16 MED ORDER — POTASSIUM CHLORIDE 10 MEQ/100ML IV SOLN
10.0000 meq | INTRAVENOUS | Status: AC
  Administered 2022-05-16 (×4): 10 meq via INTRAVENOUS
  Filled 2022-05-16 (×4): qty 100

## 2022-05-16 MED ORDER — MUPIROCIN 2 % EX OINT
1.0000 | TOPICAL_OINTMENT | Freq: Two times a day (BID) | CUTANEOUS | Status: DC
  Administered 2022-05-16 – 2022-05-20 (×8): 1 via NASAL
  Filled 2022-05-16: qty 22

## 2022-05-16 MED ORDER — FREE WATER
200.0000 mL | Freq: Four times a day (QID) | Status: DC
  Administered 2022-05-16 – 2022-05-20 (×15): 200 mL

## 2022-05-16 MED ORDER — OSMOLITE 1.5 CAL PO LIQD
1000.0000 mL | ORAL | Status: DC
  Administered 2022-05-16 – 2022-05-20 (×5): 1000 mL

## 2022-05-16 MED ORDER — PROSOURCE TF20 ENFIT COMPATIBL EN LIQD
60.0000 mL | Freq: Every day | ENTERAL | Status: DC
  Administered 2022-05-16 – 2022-05-20 (×5): 60 mL
  Filled 2022-05-16 (×5): qty 60

## 2022-05-16 NOTE — Progress Notes (Signed)
Patient slept through the night only waking to stimulation when antibiotics given IV. Patient sinus tachy on Heart monitor this shift. Previously yellow mews. Continued to monitor patient.
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
## 2022-05-16 NOTE — Progress Notes (Addendum)
Initial Nutrition Assessment  DOCUMENTATION CODES:      INTERVENTION:   -Continuous Osmolite 1.5 @ 40 ml/hr and advance every 6 hr to goal 60 ml/hr (1440 ml/day) via PEG  -Add ProSource TF20 (60 ml) packet x 1 per tube   Provides 2240, 110 gr protein and 1097 ml water (once goal rate achieved).   Increase if no IVF: Free water 200 ml QID and flushes 60 ml before and after medication administration   HOB elevated   NUTRITION DIAGNOSIS:   Inadequate oral intake related to inability to eat as evidenced by NPO status.   GOAL:  Patient will meet greater than or equal to 90% of their needs   MONITOR:  TF tolerance, Labs, Skin, Weight trends  REASON FOR ASSESSMENT:   Consult Enteral/tube feeding initiation and management  ASSESSMENT:  Patient is a 26 yo male  who presents for LTC facility with hx of TBI due to [facility] who presents with possible aspiration after episode of projectile vomiting. Pressure injury to sacrum -healing and no open areas per nursing report.   Chronic NPO with PEG tube feeding-resume Osmolite 1.5@ 40 ml/hr and increase to goal 60 ml/hr + protein modular. Increase free water if no IV-fluids to meet goal intake. Talked with RN- patient afebrile and no further vomiting , abdomen soft. Low potassium -replaced.  Stable weight x 4 months.   Medications: colace, dulcolax, keppra, miralax       Latest Ref Rng & Units 05/16/2022    3:22 AM 05/15/2022    7:36 AM 01/26/2022    5:53 AM  BMP  Glucose 70 - 99 mg/dL 93  160  109   BUN 6 - 20 mg/dL 17  31  9    Creatinine 0.61 - 1.24 mg/dL  3.23  5.57   Sodium 135 - 145 mmol/L 147  145  138   Potassium 3.5 - 5.1 mmol/L 3.0  4.5  4.0   Chloride 98 - 111 mmol/L 113  101  106   CO2 22 - 32 mmol/L 25  29  24    Calcium 8.9 - 10.3 mg/dL 9.2  3.22  9.2      Diet Order:   Diet Order             Diet NPO time specified  Diet effective now                   EDUCATION NEEDS:  Not appropriate for  education at this time  Skin:  Skin Assessment: Reviewed RN Assessment  Last BM:  11/16  Height:   Ht Readings from Last 1 Encounters:  05/15/22 6' (1.829 m)    Weight:   Wt Readings from Last 1 Encounters:  05/15/22 80 kg    Adjusted Ideal Body Weight:   73 kg   BMI:  Body mass index is 23.92 kg/m.  Estimated Nutritional Needs:   Kcal:  2100-2300  Protein:  105-115 gr  Fluid:  >2 liters daily   02-01-1987 MS,RD,CSG,LDN Contact: 05/17/22

## 2022-05-16 NOTE — TOC Initial Note (Signed)
Transition of Care [facility]) - Initial/Assessment Note    Patient Details  Name: Sean Bruce MRN: [id number] Date of Birth: 1996/03/01  [facility] CM/SW Contact:    Annice Needy, LCSW Phone Number: 05/16/2022, 12:43 PM  Clinical Narrative:                 Patient is LTC at [facility]. He is total care for ADLs. He can return to the facility. Guardian is agreeable to return.   Expected Discharge Plan: Skilled Nursing Facility Barriers to Discharge: Continued Medical Work up   Patient Goals and CMS Choice Patient states their goals for this hospitalization and ongoing recovery are:: return to [facility]      Expected Discharge Plan and Services Expected Discharge Plan: Skilled Nursing Facility       Living arrangements for the past 2 months: Skilled Nursing Facility                                      Prior Living Arrangements/Services Living arrangements for the past 2 months: Skilled Nursing Facility Lives with:: Facility Resident Patient language and need for interpreter reviewed:: Yes        Need for Family Participation in Patient Care: Yes (Comment) Care giver support system in place?: Yes (comment)   Criminal Activity/Legal Involvement Pertinent to Current Situation/Hospitalization: No - Comment as needed  Activities of Daily Living Home Assistive Devices/Equipment: Hoyer Lift ADL Screening (condition at time of admission) Patient's cognitive ability adequate to safely complete daily activities?: No Is the patient deaf or have difficulty hearing?: No Does the patient have difficulty seeing, even when wearing glasses/contacts?: No Does the patient have difficulty concentrating, remembering, or making decisions?: Yes Patient able to express need for assistance with ADLs?: No Does the patient have difficulty dressing or bathing?: Yes Independently performs ADLs?: No Communication: Dependent Is this a change from baseline?: Pre-admission  baseline Dressing (OT): Dependent Is this a change from baseline?: Pre-admission baseline Grooming: Dependent Is this a change from baseline?: Pre-admission baseline Feeding: Dependent Is this a change from baseline?: Pre-admission baseline Bathing: Dependent Is this a change from baseline?: Pre-admission baseline Toileting: Dependent Is this a change from baseline?: Pre-admission baseline In/Out Bed: Dependent Is this a change from baseline?: Pre-admission baseline Walks in Home: Dependent (pt does not walk) Is this a change from baseline?: Pre-admission baseline Does the patient have difficulty walking or climbing stairs?: Yes Weakness of Legs: Both Weakness of Arms/Hands: Both  Permission Sought/Granted Permission sought to share information with : Family Supports, Biomedical engineer Information with NAME: Facility, Therapist, nutritional and guardian, Recruitment consultant           Emotional Assessment         Alcohol / Substance Use: Not Applicable Psych Involvement: No (comment)  Admission diagnosis:  Sepsis (HCC) [A41.9] Patient Active Problem List   Diagnosis Date Noted   Sepsis (HCC) 05/15/2022   Hypomagnesemia 01/24/2022   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   Essential hypertension 01/23/2022   Diabetes mellitus type 2 in nonobese (HCC) 12/25/2021   UTI (urinary tract infection) due to urinary indwelling catheter (HCC) 12/25/2021   CAP (community acquired pneumonia) 12/25/2021   Seizure disorder (HCC) 12/25/2021   SIRS (systemic inflammatory response syndrome) (HCC) 12/24/2021   Sepsis due to undetermined organism (HCC) 12/02/2021   Pressure injury of skin 12/02/2021   Palliative care by specialist  Subdural hematoma (HCC) 09/26/2021   TBI (traumatic brain injury) (HCC) 09/25/2021   PCP:  Sherol Dade, DO Pharmacy:   [facility] [place] [facility] [name], [place] - [address] [place] [address] Phone: [phone]  Fax: [phone]     Social Determinants of Health (SDOH) Interventions    Readmission Risk Interventions    12/26/2021    3:35 PM  Readmission Risk Prevention Plan  Transportation Screening Complete  PCP or Specialist Appt within 5-7 Days Not Complete  Home Care Screening Complete  Medication Review (RN CM) Complete

## 2022-05-16 NOTE — [place] FL2 (Signed)
[place] MEDICAID FL2 LEVEL OF CARE SCREENING TOOL     IDENTIFICATION  Patient Name: Sean Bruce Birthdate: 09-Dec-1995 Sex: male Admission Date (Current Location): 05/15/2022  [place] and [place] Number:  Aaron Edelman [id number] [facility] Facility and Address:  [facility],  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   Essential hypertension 01/23/2022   Diabetes mellitus type 2 in nonobese (HCC) 12/25/2021   UTI (urinary tract infection) due to urinary indwelling catheter (HCC) 12/25/2021   CAP (community acquired pneumonia) 12/25/2021   Seizure disorder (HCC) 12/25/2021   SIRS (systemic inflammatory response syndrome) (HCC) 12/24/2021   Sepsis due to undetermined organism (HCC) 12/02/2021   Pressure injury of skin 12/02/2021   Palliative care by specialist    Subdural hematoma (HCC) 09/26/2021   TBI (traumatic brain injury) (HCC) 09/25/2021    Orientation RESPIRATION BLADDER Height & Weight        Normal Incontinent Weight: 176 lb 5.9 oz (80 kg) Height:  6' (182.9 cm)  BEHAVIORAL SYMPTOMS/MOOD NEUROLOGICAL BOWEL NUTRITION STATUS      Incontinent Diet (see d/c summary)  AMBULATORY STATUS COMMUNICATION OF NEEDS Skin   Total Care Does not communicate Normal                       Personal Care Assistance Level of Assistance  Total care       Total Care Assistance: Maximum assistance   Functional Limitations Info  Sight, Hearing, Speech Sight Info:  Adequate Hearing Info: Adequate Speech Info: Impaired    SPECIAL CARE FACTORS FREQUENCY                       Contractures Contractures Info: Not present    Additional Factors Info  Code Status, Allergies Code Status Info: Full Code Allergies Info: NKA           Current Medications (05/16/2022):  This is the current hospital active medication list Current Facility-Administered Medications  Medication Dose Route Frequency Provider Last Rate Last Admin   acetaminophen (TYLENOL) tablet 650 mg  650 mg Oral Q6H PRN Maurilio Lovely D, DO   650 mg at 05/15/22 1722   Or   acetaminophen (TYLENOL) suppository 650 mg  650 mg Rectal Q6H PRN Sherryll Burger, Pratik D, DO       amantadine (SYMMETREL) capsule 100 mg  100 mg Per Tube BID Sherryll Burger, Pratik D, DO   100 mg at 05/16/22 0915   Ampicillin-Sulbactam (UNASYN) 3 g in sodium chloride 0.9 % 100 mL IVPB  3 g Intravenous Q6H Shah, Pratik D, DO 200 mL/hr at 05/16/22 0516 3 g at 05/16/22 0516   aspirin tablet 325 mg  325 mg Per Tube Daily Maurilio Lovely D, DO   325 mg at 05/16/22 0915   bethanechol (URECHOLINE) tablet 10 mg  10 mg Per Tube TID Sherryll Burger, Pratik D, DO   10 mg at 05/16/22 0916   bisacodyl (DULCOLAX) suppository 10 mg  10  mg Rectal Daily Maurilio Lovely D, DO   10 mg at 05/16/22 8182   chlorhexidine (PERIDEX) 0.12 % solution 15 mL  15 mL Mouth Rinse BID Maurilio Lovely D, DO   15 mL at 05/16/22 0915   cloNIDine (CATAPRES - Dosed in mg/24 hr) patch 0.2 mg  0.2 mg Transdermal Weekly Sherryll Burger, Pratik D, DO   0.2 mg at 05/15/22 1819   docusate (COLACE) 50 MG/5ML liquid 100 mg  100 mg Per Tube BID Sherryll Burger, Pratik D, DO   100 mg at 05/16/22 0916   enoxaparin (LOVENOX) injection 40 mg  40 mg Subcutaneous Q24H Sherryll Burger, Pratik D, DO   40 mg at 05/15/22 1548   free water 100 mL  100 mL Per Tube QID Sherryll Burger, Pratik D, DO   100 mL at 05/16/22 0919   labetalol (NORMODYNE) injection 10 mg  10 mg Intravenous Q2H PRN Maurilio Lovely D, DO   10 mg at 05/15/22 1548   levETIRAcetam  (KEPPRA) 100 MG/ML solution 500 mg  500 mg Per Tube BID Sherryll Burger, Pratik D, DO   500 mg at 05/16/22 0916   ondansetron (ZOFRAN) injection 4 mg  4 mg Intravenous Q6H PRN Sherryll Burger, Pratik D, DO       ondansetron (ZOFRAN) tablet 4 mg  4 mg Per Tube Q6H PRN Sherryll Burger, Pratik D, DO       polyethylene glycol (MIRALAX / GLYCOLAX) packet 17 g  17 g Per Tube Daily Sherryll Burger, Pratik D, DO   17 g at 05/16/22 0916   potassium chloride 10 mEq in 100 mL IVPB  10 mEq Intravenous Q1 Hr x 4 Maurilio Lovely D, DO 100 mL/hr at 05/16/22 1239 10 mEq at 05/16/22 1239     Discharge Medications: Please see discharge summary for a list of discharge medications.  Relevant Imaging Results:  Relevant Lab Results:   Additional Information [id number]  Annice Needy, LCSW

## 2022-05-17 LAB — BASIC METABOLIC PANEL
Anion gap: 6 (ref 5–15)
BUN: 10 mg/dL (ref 6–20)
CO2: 26 mmol/L (ref 22–32)
Calcium: 9.1 mg/dL (ref 8.9–10.3)
Chloride: 113 mmol/L — ABNORMAL HIGH (ref 98–111)
Creatinine, Ser: 0.47 mg/dL — ABNORMAL LOW (ref 0.61–1.24)
GFR, Estimated: >60 mL/min (ref >60)
Glucose, Bld: 122 mg/dL — ABNORMAL HIGH (ref 70–99)
Potassium: 3.1 mmol/L — ABNORMAL LOW (ref 3.5–5.1)
Sodium: 145 mmol/L (ref 135–145)

## 2022-05-17 LAB — MAGNESIUM: Magnesium: 1.6 mg/dL — ABNORMAL LOW (ref 1.7–2.4)

## 2022-05-17 LAB — CBC
HCT: 39.9 % (ref 39.0–52.0)
Hemoglobin: 12.4 g/dL — ABNORMAL LOW (ref 13.0–17.0)
MCH: 29.1 pg (ref 26.0–34.0)
MCHC: 31.1 g/dL (ref 30.0–36.0)
MCV: 93.7 fL (ref 80.0–100.0)
Platelets: 136 10*3/uL — ABNORMAL LOW (ref 150–400)
RBC: 4.26 MIL/uL (ref 4.22–5.81)
RDW: 14 % (ref 11.5–15.5)
WBC: 9.9 10*3/uL (ref 4.0–10.5)
nRBC: 0 % (ref 0.0–0.2)

## 2022-05-17 LAB — URINE CULTURE

## 2022-05-17 MED ORDER — SODIUM CHLORIDE 0.9 % IV SOLN: INTRAVENOUS | Status: DC | PRN

## 2022-05-17 MED ORDER — POTASSIUM CHLORIDE 10 MEQ/100ML IV SOLN
INTRAVENOUS | Status: AC
  Administered 2022-05-17: 10 meq
  Filled 2022-05-17: qty 100

## 2022-05-17 MED ORDER — POTASSIUM CHLORIDE 10 MEQ/100ML IV SOLN
10.0000 meq | INTRAVENOUS | Status: AC
  Administered 2022-05-17 (×4): 10 meq via INTRAVENOUS
  Filled 2022-05-17 (×4): qty 100

## 2022-05-17 MED ORDER — AMOXICILLIN-POT CLAVULANATE 600-42.9 MG/5ML PO SUSR: 600.0000 mg | Freq: Two times a day (BID) | ORAL | 0 refills | Status: DC

## 2022-05-17 MED ORDER — CLONIDINE 0.2 MG/24HR TD PTWK: 0.2000 mg | MEDICATED_PATCH | TRANSDERMAL | 12 refills | Status: DC

## 2022-05-17 MED ORDER — MAGNESIUM SULFATE 2 GM/50ML IV SOLN
2.0000 g | Freq: Once | INTRAVENOUS | Status: AC
  Administered 2022-05-17: 2 g via INTRAVENOUS
  Filled 2022-05-17: qty 50

## 2022-05-17 NOTE — Progress Notes (Signed)
Called and requested medication Urecholine. Awaiting AC to bring.

## 2022-05-17 NOTE — Progress Notes (Signed)
Patient has a lot of mucus and seems to be struggling to swallow it down at times. Oral suctioning provided. MD informed.

## 2022-05-17 NOTE — TOC Progression Note (Signed)
Transition of Care [facility]) - Progression Note    Patient Details  Name: Sean Bruce MRN: [id number] Date of Birth: 22-Jan-1996  Transition of Care [facility]) CM/SW Contact  Lorri Frederick, LCSW Phone Number: 05/17/2022, 11:43 AM  Clinical Narrative:   Several attempts to reach Debbie/[facility].  CSW finally received call from [place] at [facility].  Eunice Blase is off this weekend, no admissions coverage.  They are not able to accept anyone over the weekend, will need to contact Debbie on Monday.     Expected Discharge Plan: Skilled Nursing Facility Barriers to Discharge: Continued Medical Work up  Expected Discharge Plan and Services Expected Discharge Plan: Skilled Nursing Facility       Living arrangements for the past 2 months: Skilled Nursing Facility Expected Discharge Date: 05/17/22                                     Social Determinants of Health (SDOH) Interventions    Readmission Risk Interventions    12/26/2021    3:35 PM  Readmission Risk Prevention Plan  Transportation Screening Complete  PCP or Specialist Appt within 5-7 Days Not Complete  Home Care Screening Complete  Medication Review (RN CM) Complete
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
## 2022-05-17 NOTE — Progress Notes (Signed)
Patient slept on and off this shift only waking when staff enter the room. Antibiotics given IV with no issue. Patient was at 25ml/hr feeding with need to progress as tolerated. Checked Feeding tube for residual with Nurse Kristi none noted, Feeding progressed to 70ml/hr at midnight. Patient tolerated well rechecked for residual at 0600 none noted, Progressed patient to 2ml/hr. Will continue to observe patient.

## 2022-05-17 NOTE — Progress Notes (Signed)
bath provided and oral care given. Full linen changed. Patient appears to be comfortable and in no pain at this time he is resting with his eyes closed. Bed in lowest position. Per social worker note no admissions this weekend so he wont be able to go back to [facility] until Monday. Will continue to monitor patient and do frequent rounding's.

## 2022-05-17 NOTE — Progress Notes (Signed)
New Osmolite 1.5 cal started at 1550 patient tolerating well. Previous fed 711 ml and 221 flush per pump.

## 2022-05-18 LAB — BASIC METABOLIC PANEL
Anion gap: 6 (ref 5–15)
BUN: 7 mg/dL (ref 6–20)
CO2: 22 mmol/L (ref 22–32)
Calcium: 8.7 mg/dL — ABNORMAL LOW (ref 8.9–10.3)
Chloride: 112 mmol/L — ABNORMAL HIGH (ref 98–111)
Creatinine, Ser: 0.43 mg/dL — ABNORMAL LOW (ref 0.61–1.24)
GFR, Estimated: >60 mL/min (ref >60)
Glucose, Bld: 101 mg/dL — ABNORMAL HIGH (ref 70–99)
Potassium: 3.8 mmol/L (ref 3.5–5.1)
Sodium: 140 mmol/L (ref 135–145)

## 2022-05-18 LAB — MAGNESIUM: Magnesium: 1.8 mg/dL (ref 1.7–2.4)

## 2022-05-18 MED ORDER — GLYCOPYRROLATE 0.2 MG/ML IJ SOLN
0.2000 mg | Freq: Three times a day (TID) | INTRAMUSCULAR | Status: DC
  Administered 2022-05-18 – 2022-05-20 (×6): 0.2 mg via INTRAVENOUS
  Filled 2022-05-18 (×6): qty 1

## 2022-05-18 NOTE — Progress Notes (Signed)
Nurse called by nurse tech to come to patient room. Patient was found  to be jerking mildly, with eyes rolled back, labored breathing and tongue hanging from his mouth. Dr. Thomes Dinning notified via secure chat. Nurse instructed to monitor patient closely. Patient received his scheduled keppra prior this episode.

## 2022-05-18 NOTE — Progress Notes (Signed)
   05/18/22 2140  Assess: MEWS Score  Temp 99.1 F (37.3 C)  BP (!) 186/120  Pulse Rate (!) 135  Resp (!) 21  Level of Consciousness Alert  SpO2 99 %  O2 Device Room Air  Assess: MEWS Score  MEWS Temp 0  MEWS Systolic 0  MEWS Pulse 3  MEWS RR 1  MEWS LOC 0  MEWS Score 4  MEWS Score Color Red  Assess: if the MEWS score is Yellow or Red  Were vital signs taken at a resting state? Yes  Focused Assessment Change from prior assessment (see assessment flowsheet)  Does the patient meet 2 or more of the SIRS criteria? Yes  Does the patient have a confirmed or suspected source of infection? Yes  Provider and Rapid Response Notified? Yes  MEWS guidelines implemented *See Row Information* Yes  Treat  MEWS Interventions Administered prn meds/treatments  Breathing 0  Negative Vocalization 0  Facial Expression 0  Body Language 0  Consolability 0  PAINAD Score 0  Complains of Other (Comment) (elevated blood pressure)  Interventions Medication (see MAR)  Take Vital Signs  Increase Vital Sign Frequency  Red: Q 1hr X 4 then Q 4hr X 4, if remains red, continue Q 4hrs  Notify: Charge Nurse/RN  Name of Charge Nurse/RN Notified Starleen Blue, RN  Date Charge Nurse/RN Notified 05/19/22  Time Charge Nurse/RN Notified 2140  Provider Notification  Provider Name/Title Thomes Dinning, MD  Date Provider Notified 05/18/22  Time Provider Notified 2140  Method of Notification Page  Notification Reason Change in status  Provider response No new orders  Date of Provider Response 05/18/22  Time of Provider Response 2140  Document  Patient Outcome Stabilized after interventions  Progress note created (see row info) Yes  Assess: SIRS CRITERIA  SIRS Temperature  0  SIRS Pulse 1  SIRS Respirations  1  SIRS WBC 1  SIRS Score Sum  3

## 2022-05-18 NOTE — Progress Notes (Signed)
Patient seen and evaluated this morning with some secretions increasing overnight for which Robinul has been added.  Recheck labs to ensure potassium and magnesium levels have normalized.  Please refer to discharge summary dictated 11/18 for full details.  He is still stable for discharge on antibiotics as prescribed, but is currently awaiting transfer back to his SNF.  No acute overnight events noted.  Total care time: 15 minutes.

## 2022-05-18 NOTE — Progress Notes (Signed)
Pt has excessive oral secretions. Mouth suctioned and moisturizer applied to oral mucosa and lips. Pt had large BM during night. Blood pressure elevated at 155/103, PRN labetalol given. IV antibiotics given as ordered.

## 2022-05-19 ENCOUNTER — Inpatient Hospital Stay (HOSPITAL_COMMUNITY)
Admit: 2022-05-19 | Discharge: 2022-05-19 | Disposition: A | Discharge status: Home / Self Care | Payer: Medicaid Other | Attending: Internal Medicine | Admitting: Internal Medicine

## 2022-05-19 DIAGNOSIS — G40919 Epilepsy, unspecified, intractable, without status epilepticus: Secondary | ICD-10-CM

## 2022-05-19 DIAGNOSIS — R569 Unspecified convulsions: Secondary | ICD-10-CM

## 2022-05-19 MED ORDER — CLONAZEPAM 0.25 MG PO TBDP: 0.5000 mg | ORAL_TABLET | Freq: Two times a day (BID) | ORAL | Status: DC

## 2022-05-19 MED ORDER — LEVETIRACETAM 100 MG/ML PO SOLN
750.0000 mg | Freq: Two times a day (BID) | ORAL | Status: DC
  Administered 2022-05-19 – 2022-05-20 (×2): 750 mg
  Filled 2022-05-19 (×4): qty 7.5

## 2022-05-19 MED ORDER — LORAZEPAM 2 MG/ML IJ SOLN
INTRAMUSCULAR | Status: AC
  Administered 2022-05-19: 2 mg via INTRAVENOUS
  Filled 2022-05-19: qty 1

## 2022-05-19 MED ORDER — LORAZEPAM 2 MG/ML IJ SOLN: 2.0000 mg | Freq: Once | INTRAMUSCULAR | Status: AC | PRN

## 2022-05-19 MED ORDER — LEVETIRACETAM 100 MG/ML PO SOLN: 750.0000 mg | Freq: Two times a day (BID) | ORAL | Status: DC

## 2022-05-19 MED ORDER — LORAZEPAM 2 MG/ML IJ SOLN: 1.0000 mg | Freq: Once | INTRAMUSCULAR | Status: DC | PRN

## 2022-05-19 MED ORDER — LEVETIRACETAM IN NACL 1500 MG/100ML IV SOLN
1500.0000 mg | Freq: Once | INTRAVENOUS | Status: AC
  Administered 2022-05-19: 1500 mg via INTRAVENOUS
  Filled 2022-05-19: qty 100

## 2022-05-19 MED ORDER — CLONAZEPAM 0.25 MG PO TBDP
0.5000 mg | ORAL_TABLET | Freq: Three times a day (TID) | ORAL | Status: DC
  Administered 2022-05-19 – 2022-05-20 (×3): 0.5 mg via ORAL
  Filled 2022-05-19 (×3): qty 2

## 2022-05-19 MED ORDER — CLONAZEPAM 0.25 MG PO TBDP: 0.2500 mg | ORAL_TABLET | Freq: Two times a day (BID) | ORAL | Status: DC

## 2022-05-19 MED ORDER — CLONAZEPAM 0.25 MG PO TBDP: 0.2500 mg | ORAL_TABLET | Freq: Every day | ORAL | Status: DC

## 2022-05-19 MED ORDER — LORAZEPAM 1 MG PO TABS: 1.0000 mg | ORAL_TABLET | Freq: Once | ORAL | Status: DC | PRN

## 2022-05-19 NOTE — Plan of Care (Signed)
  Problem: Activity: Goal: Risk for activity intolerance will decrease Outcome: Not Progressing   Problem: Nutrition: Goal: Adequate nutrition will be maintained Outcome: Progressing   Problem: Elimination: Goal: Will not experience complications related to bowel motility Outcome: Progressing Goal: Will not experience complications related to urinary retention Outcome: Progressing

## 2022-05-19 NOTE — Procedures (Signed)
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  However while in the hospital, patient was noted to have breakthrough seizures. EEG to evaluate for seizure.  Level of alertness: lethargic   AEDs during EEG study: LEV, Clonazepam  Technical aspects: This EEG study was done with scalp electrodes positioned according to the 10-20 International system of electrode placement. Electrical activity was reviewed with band pass filter of 1-70Hz , sensitivity of 7 uV/mm, display speed of 68mm/sec with a 60Hz  notched filter applied as appropriate. EEG data were recorded continuously and digitally stored.  Video monitoring was available and reviewed as appropriate.  Description: EEG showed continuous generalized 3 to 6 Hz theta-delta slowing admixed with an excessive amount of 15 to 18 Hz beta activity distributed symmetrically and diffusely. Hyperventilation and photic stimulation were not performed.     ABNORMALITY - Continuous slow, generalized  IMPRESSION: This study is suggestive of moderate to severe diffuse encephalopathy, nonspecific etiology. No seizures or epileptiform discharges were seen throughout the recording.  Lillieann Pavlich 

## 2022-05-19 NOTE — Progress Notes (Signed)
Patient has order for wound care but there were no wounds found on assessment.
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
## 2022-05-19 NOTE — Progress Notes (Addendum)
RN called due to patient having seizure-like activity x2 despite taking his seizure medication.  It was described as eyes rolling back with mild jerking with tongue hanging out.  Episodes self resolved within a minute  Ativan as needed will be ordered EEG was ordered, continue fall and seizure precautions Teleneurology will be consulted, we shall await further recommendation

## 2022-05-19 NOTE — Progress Notes (Signed)
EEG complete - results pending 
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
## 2022-05-20 LAB — BASIC METABOLIC PANEL
Anion gap: 7 (ref 5–15)
BUN: 10 mg/dL (ref 6–20)
CO2: 24 mmol/L (ref 22–32)
Calcium: 9.1 mg/dL (ref 8.9–10.3)
Chloride: 108 mmol/L (ref 98–111)
Creatinine, Ser: 0.41 mg/dL — ABNORMAL LOW (ref 0.61–1.24)
GFR, Estimated: >60 mL/min (ref >60)
Glucose, Bld: 105 mg/dL — ABNORMAL HIGH (ref 70–99)
Potassium: 4 mmol/L (ref 3.5–5.1)
Sodium: 139 mmol/L (ref 135–145)

## 2022-05-20 LAB — CULTURE, BLOOD (ROUTINE X 2)
Culture: NO GROWTH
Culture: NO GROWTH

## 2022-05-20 LAB — CBC
HCT: 44.2 % (ref 39.0–52.0)
Hemoglobin: 14.2 g/dL (ref 13.0–17.0)
MCH: 29.3 pg (ref 26.0–34.0)
MCHC: 32.1 g/dL (ref 30.0–36.0)
MCV: 91.3 fL (ref 80.0–100.0)
Platelets: 182 10*3/uL (ref 150–400)
RBC: 4.84 MIL/uL (ref 4.22–5.81)
RDW: 14.1 % (ref 11.5–15.5)
WBC: 12.6 10*3/uL — ABNORMAL HIGH (ref 4.0–10.5)
nRBC: 0 % (ref 0.0–0.2)

## 2022-05-20 LAB — MAGNESIUM: Magnesium: 1.8 mg/dL (ref 1.7–2.4)

## 2022-05-20 MED ORDER — AMOXICILLIN-POT CLAVULANATE 600-42.9 MG/5ML PO SUSR: 600.0000 mg | Freq: Two times a day (BID) | ORAL | 0 refills | Status: AC

## 2022-05-20 MED ORDER — CLONAZEPAM 0.5 MG PO TABS: ORAL_TABLET | ORAL | 0 refills | Status: AC
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
## 2022-05-20 NOTE — TOC Transition Note (Signed)
Transition of Care [facility]) - CM/SW Discharge Note   Patient Details  Name: Sean Bruce MRN: [id number] Date of Birth: 1995/11/02  Transition of Care [facility]) CM/SW Contact:  Annice Needy, LCSW Phone Number: 05/20/2022, 11:54 AM   Clinical Narrative:    Discharge clinicals sent to facility. RN to call report. TOC signing off.    Final next level of care: Skilled Nursing Facility Barriers to Discharge: No Barriers Identified   Patient Goals and CMS Choice Patient states their goals for this hospitalization and ongoing recovery are:: return to [facility]      Discharge Placement              Patient chooses bed at: Other - please specify in the comment section below: [facility]) Patient to be transferred to facility by: [facility] Name of family member notified: message left for legal guardian Patient and family notified of of transfer: 05/20/22  Discharge Plan and Services                                     Social Determinants of Health (SDOH) Interventions     Readmission Risk Interventions    12/26/2021    3:35 PM  Readmission Risk Prevention Plan  Transportation Screening Complete  PCP or Specialist Appt within 5-7 Days Not Complete  Home Care Screening Complete  Medication Review (RN CM) Complete

## 2022-05-20 NOTE — Progress Notes (Signed)
Assumed care of patient. Pt opens eyes when spoken to but no verbal response. Skin warm and dry, color appropriate for ethnicity.  Breath sounds clear but diminished. HR regular to auscultation. Abd soft, bowel sounds active x4. Feeding tube intact to abd, DDI. Tube feeding continues as ordered. Malewick intact with clear yellow urine noted. Right foot/ankle with +1 edema. Pedal pulses weak to palpation. Bilateral hands with noted contractions, device intact to right hand and rolled wash cloth intact to left hand. Bed alarm on for safety. No needs identified at this time.

## 2022-05-20 NOTE — Progress Notes (Signed)
Tube feeding pump continues to beep "line occlusion". Troubleshooting on line, Jtube and pump completed by this nurse and Ardis Rowan, RN without success in resolving issue. J-tub flushes well, less than 67ml residual noted. Tube feeding stopped at this time and new pump requested from portable equipment. Oncoming nurse Karis Juba, LPN notified of same.  B/P elevated at [address], prn Labetolol administered. B/P rechecked at 0700, 139/97. Heart rate remains elevated 105-110 bpm. Pt did require oral suctioning x2 tonight due to congested cough and inability to expectorate phlegm. Oral care provided, pt bites down on swab and oral suction.

## 2022-05-20 NOTE — Progress Notes (Signed)
Patient stable and ready for discharge to [facility]. iV removed. Writer called report spoke with [facility], lpn and she verbalized understanding. Sent copper bracelets back with patient. Discharge paperwork given to EMS transporters.

## 2022-07-14 ENCOUNTER — Emergency Department (HOSPITAL_COMMUNITY): Payer: Medicaid Other

## 2022-07-14 ENCOUNTER — Emergency Department (HOSPITAL_COMMUNITY)
Admission: EM | Admit: 2022-07-14 | Discharge: 2022-07-14 | Disposition: A | Discharge status: Skilled Nursing Facility | Payer: Medicaid Other | Attending: Emergency Medicine | Admitting: Emergency Medicine

## 2022-07-14 DIAGNOSIS — Z043 Encounter for examination and observation following other accident: Secondary | ICD-10-CM | POA: Diagnosis present

## 2022-07-14 DIAGNOSIS — W19XXXA Unspecified fall, initial encounter: Secondary | ICD-10-CM | POA: Diagnosis not present

## 2022-07-14 NOTE — Discharge Instructions (Signed)
You are seen in the emergency department for evaluation of injuries from a fall.  You had a CAT scan of your head along with x-rays of your chest and pelvis that did not show any acute traumatic findings.  Please observe back to your facility for any other concerns.  Return to the emergency department as needed.

## 2022-07-14 NOTE — ED Triage Notes (Signed)
Patient BIB [facility] from [facility] due to a fall last night. Staff at [facility] note they found him in the floor beside his bed last night. 

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
## 2023-02-03 ENCOUNTER — Other Ambulatory Visit (HOSPITAL_COMMUNITY): Payer: Self-pay | Admitting: Student

## 2023-02-03 DIAGNOSIS — Z931 Gastrostomy status: Secondary | ICD-10-CM

## 2023-02-04 ENCOUNTER — Other Ambulatory Visit (HOSPITAL_COMMUNITY): Payer: Self-pay | Admitting: Student

## 2023-02-04 ENCOUNTER — Ambulatory Visit (HOSPITAL_COMMUNITY)
Admission: RE | Admit: 2023-02-04 | Discharge: 2023-02-04 | Disposition: A | Discharge status: Home / Self Care | Payer: Medicaid Other | Source: Ambulatory Visit | Attending: Student | Admitting: Student

## 2023-02-04 DIAGNOSIS — Z931 Gastrostomy status: Secondary | ICD-10-CM

## 2023-02-04 DIAGNOSIS — Z431 Encounter for attention to gastrostomy: Secondary | ICD-10-CM | POA: Insufficient documentation

## 2023-02-04 HISTORY — PX: IR REPLC GASTRO/COLONIC TUBE PERCUT W/FLUORO: IMG2333

## 2023-02-04 MED ORDER — LIDOCAINE VISCOUS HCL 2 % MT SOLN
OROMUCOSAL | Status: AC
  Filled 2023-02-04: qty 15

## 2023-02-04 MED ORDER — IOHEXOL 300 MG/ML  SOLN
50.0000 mL | Freq: Once | INTRAMUSCULAR | Status: AC | PRN
  Administered 2023-02-04: 10 mL

## 2023-02-12 ENCOUNTER — Inpatient Hospital Stay (HOSPITAL_COMMUNITY)
Admission: EM | Admit: 2023-02-12 | Discharge: 2023-03-01 | DRG: 871 | Disposition: E | Payer: Medicaid Other | Source: Skilled Nursing Facility | Attending: Internal Medicine | Admitting: Internal Medicine

## 2023-02-12 ENCOUNTER — Encounter (HOSPITAL_COMMUNITY): Payer: Self-pay

## 2023-02-12 ENCOUNTER — Emergency Department (HOSPITAL_COMMUNITY): Payer: Medicaid Other

## 2023-02-12 ENCOUNTER — Other Ambulatory Visit: Payer: Self-pay

## 2023-02-12 DIAGNOSIS — Z7982 Long term (current) use of aspirin: Secondary | ICD-10-CM

## 2023-02-12 DIAGNOSIS — J9601 Acute respiratory failure with hypoxia: Secondary | ICD-10-CM

## 2023-02-12 DIAGNOSIS — R403 Persistent vegetative state: Secondary | ICD-10-CM | POA: Diagnosis present

## 2023-02-12 DIAGNOSIS — Z1152 Encounter for screening for COVID-19: Secondary | ICD-10-CM | POA: Diagnosis not present

## 2023-02-12 DIAGNOSIS — Z794 Long term (current) use of insulin: Secondary | ICD-10-CM | POA: Diagnosis not present

## 2023-02-12 DIAGNOSIS — G40101 Localization-related (focal) (partial) symptomatic epilepsy and epileptic syndromes with simple partial seizures, not intractable, with status epilepticus: Secondary | ICD-10-CM | POA: Diagnosis present

## 2023-02-12 DIAGNOSIS — M952 Other acquired deformity of head: Secondary | ICD-10-CM | POA: Diagnosis present

## 2023-02-12 DIAGNOSIS — A419 Sepsis, unspecified organism: Principal | ICD-10-CM | POA: Diagnosis present

## 2023-02-12 DIAGNOSIS — I10 Essential (primary) hypertension: Secondary | ICD-10-CM | POA: Diagnosis present

## 2023-02-12 DIAGNOSIS — G40901 Epilepsy, unspecified, not intractable, with status epilepticus: Secondary | ICD-10-CM

## 2023-02-12 DIAGNOSIS — Z978 Presence of other specified devices: Secondary | ICD-10-CM

## 2023-02-12 DIAGNOSIS — S069X0S Unspecified intracranial injury without loss of consciousness, sequela: Secondary | ICD-10-CM | POA: Diagnosis not present

## 2023-02-12 DIAGNOSIS — Z960 Presence of urogenital implants: Secondary | ICD-10-CM | POA: Diagnosis present

## 2023-02-12 DIAGNOSIS — Z2239 Carrier of other specified bacterial diseases: Secondary | ICD-10-CM | POA: Diagnosis not present

## 2023-02-12 DIAGNOSIS — E119 Type 2 diabetes mellitus without complications: Secondary | ICD-10-CM | POA: Diagnosis present

## 2023-02-12 DIAGNOSIS — J690 Pneumonitis due to inhalation of food and vomit: Secondary | ICD-10-CM | POA: Diagnosis present

## 2023-02-12 DIAGNOSIS — Z515 Encounter for palliative care: Secondary | ICD-10-CM | POA: Diagnosis not present

## 2023-02-12 DIAGNOSIS — H5500 Unspecified nystagmus: Secondary | ICD-10-CM | POA: Diagnosis present

## 2023-02-12 DIAGNOSIS — R569 Unspecified convulsions: Secondary | ICD-10-CM | POA: Diagnosis not present

## 2023-02-12 DIAGNOSIS — G40909 Epilepsy, unspecified, not intractable, without status epilepticus: Secondary | ICD-10-CM | POA: Diagnosis not present

## 2023-02-12 DIAGNOSIS — Z66 Do not resuscitate: Secondary | ICD-10-CM | POA: Diagnosis present

## 2023-02-12 DIAGNOSIS — R7881 Bacteremia: Secondary | ICD-10-CM | POA: Insufficient documentation

## 2023-02-12 DIAGNOSIS — S069XAA Unspecified intracranial injury with loss of consciousness status unknown, initial encounter: Secondary | ICD-10-CM | POA: Diagnosis present

## 2023-02-12 DIAGNOSIS — N390 Urinary tract infection, site not specified: Secondary | ICD-10-CM | POA: Diagnosis present

## 2023-02-12 DIAGNOSIS — Z79899 Other long term (current) drug therapy: Secondary | ICD-10-CM | POA: Diagnosis not present

## 2023-02-12 DIAGNOSIS — Z931 Gastrostomy status: Secondary | ICD-10-CM | POA: Diagnosis not present

## 2023-02-12 DIAGNOSIS — G8250 Quadriplegia, unspecified: Secondary | ICD-10-CM | POA: Diagnosis present

## 2023-02-12 DIAGNOSIS — Z7189 Other specified counseling: Secondary | ICD-10-CM | POA: Diagnosis not present

## 2023-02-12 HISTORY — DX: Unspecified protein-calorie malnutrition: E46

## 2023-02-12 HISTORY — DX: Type 2 diabetes mellitus without complications: E11.9

## 2023-02-12 HISTORY — DX: Tachycardia, unspecified: R00.0

## 2023-02-12 HISTORY — DX: Cerebral infarction due to thrombosis of right posterior cerebral artery: I63.331

## 2023-02-12 HISTORY — DX: Unspecified convulsions: R56.9

## 2023-02-12 HISTORY — DX: Retention of urine, unspecified: R33.9

## 2023-02-12 LAB — CBC WITH DIFFERENTIAL/PLATELET
Abs Immature Granulocytes: 0.22 10*3/uL — ABNORMAL HIGH (ref 0.00–0.07)
Basophils Absolute: 0.1 10*3/uL (ref 0.0–0.1)
Basophils Relative: 1 %
Eosinophils Absolute: 0.1 10*3/uL (ref 0.0–0.5)
Eosinophils Relative: 1 %
HCT: 55.4 % — ABNORMAL HIGH (ref 39.0–52.0)
Hemoglobin: 18 g/dL — ABNORMAL HIGH (ref 13.0–17.0)
Immature Granulocytes: 1 %
Lymphocytes Relative: 5 %
Lymphs Abs: 1 10*3/uL (ref 0.7–4.0)
MCH: 29.9 pg (ref 26.0–34.0)
MCHC: 32.5 g/dL (ref 30.0–36.0)
MCV: 92 fL (ref 80.0–100.0)
Monocytes Absolute: 2.2 10*3/uL — ABNORMAL HIGH (ref 0.1–1.0)
Monocytes Relative: 11 %
Neutro Abs: 16.5 10*3/uL — ABNORMAL HIGH (ref 1.7–7.7)
Neutrophils Relative %: 81 %
Platelets: 228 10*3/uL (ref 150–400)
RBC: 6.02 MIL/uL — ABNORMAL HIGH (ref 4.22–5.81)
RDW: 13.4 % (ref 11.5–15.5)
WBC: 20.1 10*3/uL — ABNORMAL HIGH (ref 4.0–10.5)
nRBC: 0 % (ref 0.0–0.2)

## 2023-02-12 LAB — MAGNESIUM: Magnesium: 1.7 mg/dL (ref 1.7–2.4)

## 2023-02-12 LAB — URINALYSIS, W/ REFLEX TO CULTURE (INFECTION SUSPECTED)
Glucose, UA: NEGATIVE mg/dL
Ketones, ur: NEGATIVE mg/dL
Nitrite: POSITIVE — AB
Protein, ur: 100 mg/dL — AB
RBC / HPF: >50 RBC/hpf (ref 0–5)
Specific Gravity, Urine: 1.01 (ref 1.005–1.030)
pH: 8.5 — ABNORMAL HIGH (ref 5.0–8.0)

## 2023-02-12 LAB — COMPREHENSIVE METABOLIC PANEL
ALT: 51 U/L — ABNORMAL HIGH (ref 0–44)
AST: 19 U/L (ref 15–41)
Albumin: 4.5 g/dL (ref 3.5–5.0)
Alkaline Phosphatase: 165 U/L — ABNORMAL HIGH (ref 38–126)
Anion gap: 17 — ABNORMAL HIGH (ref 5–15)
BUN: 18 mg/dL (ref 6–20)
CO2: 25 mmol/L (ref 22–32)
Calcium: 10.4 mg/dL — ABNORMAL HIGH (ref 8.9–10.3)
Chloride: 97 mmol/L — ABNORMAL LOW (ref 98–111)
Creatinine, Ser: 0.58 mg/dL — ABNORMAL LOW (ref 0.61–1.24)
GFR, Estimated: >60 mL/min (ref >60)
Glucose, Bld: 130 mg/dL — ABNORMAL HIGH (ref 70–99)
Potassium: 3.8 mmol/L (ref 3.5–5.1)
Sodium: 139 mmol/L (ref 135–145)
Total Bilirubin: 0.9 mg/dL (ref 0.3–1.2)
Total Protein: 9.1 g/dL — ABNORMAL HIGH (ref 6.5–8.1)

## 2023-02-12 LAB — GLUCOSE, CAPILLARY: Glucose-Capillary: 92 mg/dL (ref 70–99)

## 2023-02-12 LAB — SARS CORONAVIRUS 2 BY RT PCR: SARS Coronavirus 2 by RT PCR: NEGATIVE

## 2023-02-12 LAB — LACTIC ACID, PLASMA: Lactic Acid, Venous: 1.3 mmol/L (ref 0.5–1.9)

## 2023-02-12 LAB — CBG MONITORING, ED
Glucose-Capillary: 142 mg/dL — ABNORMAL HIGH (ref 70–99)
Glucose-Capillary: 137 mg/dL — ABNORMAL HIGH (ref 70–99)

## 2023-02-12 MED ORDER — LORAZEPAM 2 MG/ML IJ SOLN
2.0000 mg | INTRAMUSCULAR | Status: DC | PRN
  Administered 2023-02-12 – 2023-02-13 (×3): 2 mg via INTRAVENOUS
  Filled 2023-02-12 (×3): qty 1

## 2023-02-12 MED ORDER — LORAZEPAM 2 MG/ML IJ SOLN
INTRAMUSCULAR | Status: AC
  Administered 2023-02-12: 2 mg via INTRAVENOUS
  Filled 2023-02-12: qty 1

## 2023-02-12 MED ORDER — LACTATED RINGERS IV BOLUS
1000.0000 mL | Freq: Once | INTRAVENOUS | Status: AC
  Administered 2023-02-12: 1000 mL via INTRAVENOUS

## 2023-02-12 MED ORDER — LEVETIRACETAM IN NACL 1500 MG/100ML IV SOLN
1500.0000 mg | Freq: Once | INTRAVENOUS | Status: AC
  Administered 2023-02-12: 1500 mg via INTRAVENOUS
  Filled 2023-02-12: qty 100

## 2023-02-12 MED ORDER — LORAZEPAM 2 MG/ML IJ SOLN
2.0000 mg | Freq: Once | INTRAMUSCULAR | Status: AC
  Administered 2023-02-12: 2 mg via INTRAVENOUS
  Filled 2023-02-12: qty 1

## 2023-02-12 MED ORDER — LACTATED RINGERS IV SOLN: INTRAVENOUS | Status: DC

## 2023-02-12 MED ORDER — SODIUM CHLORIDE 0.9 % IV SOLN
750.0000 mg | Freq: Two times a day (BID) | INTRAVENOUS | Status: DC
  Filled 2023-02-12 (×4): qty 7.5

## 2023-02-12 MED ORDER — LORAZEPAM 2 MG/ML IJ SOLN: 2.0000 mg | Freq: Once | INTRAMUSCULAR | Status: AC

## 2023-02-12 MED ORDER — SODIUM CHLORIDE 0.9 % IV SOLN
500.0000 mg | INTRAVENOUS | Status: DC
  Administered 2023-02-12: 500 mg via INTRAVENOUS
  Filled 2023-02-12: qty 5

## 2023-02-12 MED ORDER — IOHEXOL 300 MG/ML  SOLN
80.0000 mL | Freq: Once | INTRAMUSCULAR | Status: AC | PRN
  Administered 2023-02-12: 80 mL via INTRAVENOUS

## 2023-02-12 MED ORDER — LEVETIRACETAM IN NACL 1000 MG/100ML IV SOLN
1000.0000 mg | INTRAVENOUS | Status: AC
  Administered 2023-02-12: 1000 mg via INTRAVENOUS
  Filled 2023-02-12: qty 100

## 2023-02-12 MED ORDER — SODIUM CHLORIDE 0.9 % IV SOLN
3.0000 g | Freq: Four times a day (QID) | INTRAVENOUS | Status: DC
  Administered 2023-02-12 – 2023-02-13 (×4): 3 g via INTRAVENOUS
  Filled 2023-02-12 (×10): qty 8

## 2023-02-12 MED ORDER — SODIUM CHLORIDE 0.9 % IV SOLN
1.0000 g | Freq: Once | INTRAVENOUS | Status: AC
  Administered 2023-02-12: 1 g via INTRAVENOUS
  Filled 2023-02-12: qty 10

## 2023-02-12 MED ORDER — LORAZEPAM 2 MG/ML IJ SOLN
1.0000 mg | Freq: Once | INTRAMUSCULAR | Status: AC
  Administered 2023-02-12: 1 mg via INTRAVENOUS
  Filled 2023-02-12: qty 1

## 2023-02-12 NOTE — Plan of Care (Signed)
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
## 2023-02-12 NOTE — Progress Notes (Signed)
Pharmacy Antibiotic Note  Sean Bruce is a 27 y.o. male admitted on 02/12/2023 with aspiration pneumonia.  Pharmacy has been consulted for Unasyn dosing.  Plan: Unasyn 3000 mg IV every 6 hours. Monitor labs, c/s, and patient improvement.   Height: 6' (182.9 cm) Weight: 75.4 kg (166 lb 3.6 oz) IBW/kg (Calculated) : 77.6  Temp (24hrs), Avg:96.7 F (35.9 C), Min:96.7 F (35.9 C), Max:96.7 F (35.9 C)  Recent Labs  Lab 02/12/23 1242  WBC 20.1*  CREATININE 0.58*  LATICACIDVEN 1.3    Estimated Creatinine Clearance: 146.6 mL/min (A) (by C-G formula based on SCr of 0.58 mg/dL (L)).    No Known Allergies  Antimicrobials this admission: Unasyn 8/15 >> Azith 8/15 >> CTX 8/15  Microbiology results: 8/15 BCx: pending   Thank you for allowing pharmacy to be a part of this patient's care.  Judeth Cornfield, PharmD Clinical Pharmacist 02/12/2023 3:35 PM

## 2023-02-12 NOTE — ED Notes (Signed)
Patient transported to CT 

## 2023-02-12 NOTE — ED Provider Notes (Signed)
[place] EMERGENCY DEPARTMENT AT [facility] Provider Note   CSN: [id number] Arrival date & time: 02/12/23  1127     History  Chief Complaint  Patient presents with   Aspiration    Sean Bruce is a 27 y.o. male.  HPI 27 year old male with previous history of stroke, diabetes, seizures, TBI, pneumonia, presents with vomiting and concern for aspiration.  History is primarily from EMS who got report from the nursing facility.  Patient apparently vomited starting last night and due to change in lung sounds they were worried about aspiration.  Has continued having vomiting.  Had a seizure with EMS that they estimated lasted about 10 minutes.  Home Medications Prior to Admission medications   Medication Sig Start Date End Date Taking? Authorizing Provider  acetaminophen (TYLENOL) 500 MG tablet Place 2 tablets (1,000 mg total) into feeding tube every 6 (six) hours as needed for mild pain, moderate pain or fever (temp >101.5). Patient taking differently: Place 1,000 mg into feeding tube every 6 (six) hours as needed for fever (pain). 11/25/21   Adam Phenix, PA-C  amantadine (SYMMETREL) 100 MG capsule Place 1 capsule (100 mg total) into feeding tube 2 (two) times daily. 11/25/21   Adam Phenix, PA-C  Amino Acids-Protein Hydrolys (FEEDING SUPPLEMENT, PRO-STAT SUGAR FREE 64,) LIQD Place 30 mLs into feeding tube daily.    [provider]  aspirin 325 MG tablet Place 1 tablet (325 mg total) into feeding tube daily. 11/25/21   Adam Phenix, PA-C  bethanechol (URECHOLINE) 10 MG tablet Place 1 tablet (10 mg total) into feeding tube 3 (three) times daily. 11/25/21   Adam Phenix, PA-C  bisacodyl (DULCOLAX) 10 MG suppository Place 1 suppository (10 mg total) rectally daily. 11/25/21   Adam Phenix, PA-C  carvedilol (COREG) 25 MG tablet Place 1 tablet (25 mg total) into feeding tube 2 (two) times daily with a meal. 12/29/21 05/15/22  Sherryll Burger, Pratik D, DO   chlorhexidine (PERIDEX) 0.12 % solution 15 mLs by Mouth Rinse route 2 (two) times daily. 11/25/21   Adam Phenix, PA-C  clonazePAM (KLONOPIN) 0.5 MG tablet Take 1 tablet (0.5 mg total) by mouth 3 (three) times daily for 2 days, THEN 1 tablet (0.5 mg total) 2 (two) times daily for 2 days, THEN 0.5 tablets (0.25 mg total) 2 (two) times daily for 2 days, THEN 0.5 tablets (0.25 mg total) daily for 2 days. 05/20/22 05/28/22  Sherryll Burger, Pratik D, DO  cloNIDine (CATAPRES - DOSED IN MG/24 HR) 0.2 mg/24hr patch Place 1 patch (0.2 mg total) onto the skin once a week. 05/22/22   Sherryll Burger, Pratik D, DO  docusate (COLACE) 50 MG/5ML liquid Place 10 mLs (100 mg total) into feeding tube 2 (two) times daily. 11/25/21   Adam Phenix, PA-C  enoxaparin (LOVENOX) 40 MG/0.4ML injection Inject 0.4 mLs (40 mg total) into the skin daily. Patient not taking: Reported on 03/25/2022 12/05/21   Tyrone Nine, MD  insulin aspart (NOVOLOG) 100 UNIT/ML injection Inject 0-15 Units into the skin every 4 (four) hours. Patient taking differently: Inject 0-12 Units into the skin every 4 (four) hours. Per sliding scale: 100 - 250 = 2 units 251 - 300 = 4 units 301 - 350 = 6 units 351 - 400 = 8 units 401 - 450 = 10 units 451 - 500 = 12 units 11/25/21   Simaan, Francine Graven, PA-C  Nutritional Supplements (FEEDING SUPPLEMENT, OSMOLITE 1.5 CAL,) LIQD Place 1,000 mLs into feeding  tube continuous. Run at 60 cc per hour continuous 01/26/22   Tat, David, MD  Nutritional Supplements (FEEDING SUPPLEMENT, PROSOURCE TF,) liquid Place 45 mLs into feeding tube daily. 01/26/22   Catarina Hartshorn, MD  ondansetron (ZOFRAN) 4 MG tablet Place 1 tablet (4 mg total) into feeding tube every 6 (six) hours as needed for nausea. 11/25/21   Adam Phenix, PA-C  polyethylene glycol (MIRALAX / GLYCOLAX) 17 g packet Place 17 g into feeding tube daily. 11/25/21   Adam Phenix, PA-C  Water For Irrigation, Sterile (FREE WATER) SOLN Place 100 mLs into feeding tube 4  (four) times daily. 11/25/21   Adam Phenix, PA-C      Allergies    Patient has no known allergies.    Review of Systems   Review of Systems  Unable to perform ROS: Patient nonverbal    Physical Exam Updated Vital Signs BP (!) 133/110   Pulse (!) 128   Temp (!) 96.7 F (35.9 C) (Rectal)   Resp (!) 27   Ht 6' (1.829 m)   Wt 75.4 kg   SpO2 94%   BMI 22.54 kg/m  Physical Exam Vitals and nursing note reviewed.  Constitutional:      Appearance: He is well-developed.  HENT:     Head:     Comments: Chronic TBI Cardiovascular:     Rate and Rhythm: Regular rhythm. Tachycardia present.     Heart sounds: Normal heart sounds.  Pulmonary:     Effort: Pulmonary effort is normal.     Breath sounds: Normal breath sounds.     Comments: Coarse breath sounds Abdominal:     General: There is no distension.     Palpations: Abdomen is soft.     Tenderness: There is no abdominal tenderness.  Skin:    General: Skin is warm and dry.  Neurological:     Mental Status: He is alert.     Comments: Patient seems to have increased tone in all 4 extremities.  No overt seizure-like activity.  Seems to somewhat respond when I call his name.     ED Results / Procedures / Treatments   Labs (all labs ordered are listed, but only abnormal results are displayed) Labs Reviewed  COMPREHENSIVE METABOLIC PANEL - Abnormal; Notable for the following components:      Result Value   Chloride 97 (*)    Glucose, Bld 130 (*)    Creatinine, Ser 0.58 (*)    Calcium 10.4 (*)    Total Protein 9.1 (*)    ALT 51 (*)    Alkaline Phosphatase 165 (*)    Anion gap 17 (*)    All other components within normal limits  CBC WITH DIFFERENTIAL/PLATELET - Abnormal; Notable for the following components:   WBC 20.1 (*)    RBC 6.02 (*)    Hemoglobin 18.0 (*)    HCT 55.4 (*)    Neutro Abs 16.5 (*)    Monocytes Absolute 2.2 (*)    Abs Immature Granulocytes 0.22 (*)    All other components within normal limits   CBG MONITORING, ED - Abnormal; Notable for the following components:   Glucose-Capillary 142 (*)    All other components within normal limits  CBG MONITORING, ED - Abnormal; Notable for the following components:   Glucose-Capillary 137 (*)    All other components within normal limits  SARS CORONAVIRUS 2 BY RT PCR  CULTURE, BLOOD (ROUTINE X 2)  CULTURE, BLOOD (ROUTINE X 2)  MAGNESIUM  LACTIC ACID, PLASMA  URINALYSIS, W/ REFLEX TO CULTURE (INFECTION SUSPECTED)  I-STAT CHEM 8, ED    EKG EKG Interpretation Date/Time:  Thursday February 12 2023 12:26:26 EDT Ventricular Rate:  116 PR Interval:  174 QRS Duration:  93 QT Interval:  334 QTC Calculation: 464 R Axis:   139  Text Interpretation: Sinus tachycardia Ventricular premature complex Aberrant complex Left posterior fascicular block Artifact in lead(s) I II aVR overall similar to Nov 2023 Confirmed by Pricilla Loveless [phone]) on 02/12/2023 1:05:34 PM  Radiology DG Chest Portable 1 View  Result Date: 02/12/2023 CLINICAL DATA:  Vomiting with possible aspiration EXAM: PORTABLE CHEST 1 VIEW COMPARISON:  Chest radiograph dated 07/14/2022 FINDINGS: Low lung volumes. Left retrocardiac patchy opacities. No pleural effusion or pneumothorax. The heart size and mediastinal contours are within normal limits. No acute osseous abnormality. Percutaneous gastrostomy tube projects over the left upper quadrant. IMPRESSION: Left retrocardiac patchy opacities, which may represent atelectasis or aspiration. Electronically Signed   By: Agustin Cree M.D.   On: 02/12/2023 15:01   CT Head Wo Contrast  Result Date: 02/12/2023 CLINICAL DATA:  Mental status change, unknown cause EXAM: CT HEAD WITHOUT CONTRAST TECHNIQUE: Contiguous axial images were obtained from the base of the skull through the vertex without intravenous contrast. RADIATION DOSE REDUCTION: This exam was performed according to the departmental dose-optimization program which includes automated exposure  control, adjustment of the mA and/or kV according to patient size and/or use of iterative reconstruction technique. COMPARISON:  CT Head 07/14/22 FINDINGS: Brain: Postsurgical changes from a right hemicraniectomy. Encephalomalacia in the bilateral frontal lobe, right temporal lobe, and right occipital lobe. No hemorrhage. No hydrocephalus. No extra-axial fluid collection. There is ex vacuo dilatation of the right lateral ventricular system. No midline shift. No mass effect. No CT evidence of an acute cortical infarct. Vascular: No hyperdense vessel or unexpected calcification. Skull: Right hemicraniectomy. Sinuses/Orbits: No middle ear or mastoid effusion. Paranasal sinuses are clear. Orbits are unremarkable Other: None. IMPRESSION: 1. No acute intracranial abnormality. 2. Postsurgical changes from a right hemicraniectomy. Unchanged encephalomalacia in the bilateral frontal lobes, right temporal lobe, and right occipital lobe. Electronically Signed   By: Lorenza Cambridge M.D.   On: 02/12/2023 13:38    Procedures .Critical Care  Performed by: Pricilla Loveless, MD Authorized by: Pricilla Loveless, MD   Critical care provider statement:    Critical care time (minutes):  45   Critical care time was exclusive of:  Separately billable procedures and treating other patients   Critical care was necessary to treat or prevent imminent or life-threatening deterioration of the following conditions:  CNS failure or compromise and sepsis   Critical care was time spent personally by me on the following activities:  Development of treatment plan with patient or surrogate, discussions with consultants, evaluation of patient's response to treatment, examination of patient, ordering and review of laboratory studies, ordering and review of radiographic studies, ordering and performing treatments and interventions, pulse oximetry, re-evaluation of patient's condition and review of old charts     Medications Ordered in  ED Medications  cefTRIAXone (ROCEPHIN) 1 g in sodium chloride 0.9 % 100 mL IVPB (1 g Intravenous New Bag/Given 02/12/23 1453)  azithromycin (ZITHROMAX) 500 mg in sodium chloride 0.9 % 250 mL IVPB (500 mg Intravenous New Bag/Given 02/12/23 1508)  lactated ringers bolus 1,000 mL (has no administration in time range)  levETIRAcetam (KEPPRA) IVPB 1500 mg/ 100 mL premix (0 mg Intravenous Stopped 02/12/23 1342)  lactated ringers bolus  1,000 mL (1,000 mLs Intravenous Bolus 02/12/23 1242)  LORazepam (ATIVAN) injection 1 mg (1 mg Intravenous Given 02/12/23 1239)  LORazepam (ATIVAN) injection 2 mg (2 mg Intravenous Given 02/12/23 1331)  LORazepam (ATIVAN) injection 2 mg (2 mg Intravenous Given 02/12/23 1350)    ED Course/ Medical Decision Making/ A&P                                 Medical Decision Making Amount and/or Complexity of Data Reviewed Labs: ordered. Radiology: ordered and independent interpretation performed.    Details: Left-sided infiltrate.  Chronic head injury ECG/medicine tests: ordered and independent interpretation performed.    Details: Sinus tachycardia would  Risk Prescription drug management.   Patient presents with seizures from EMS.  He is not seizing by the time I got to his room after being called from another room.  However he did seem to develop recurrent episodes that were concerning for possible seizures.  He is a difficult historian and examination but due to his chronic TBI.  It is unclear what his baseline.  I tried to call the facility but no answer.  He had a couple episodes where it seemed like he was often looking to the left with his eyes deviated to the left.  Would then have roving eye movements/nystagmus as well.  He was given a couple doses of Ativan.  He also became mildly hypoxic and his x-ray is concerning for possible pneumonia which makes sense given his aspiration and now cough.  He has a significant leukocytosis.  He was started broadly on IV antibiotics  and given IV Keppra.  I discussed with Dr. Amada Jupiter who has discussed with Dr. Melynda Ripple who will see the patient.  He will otherwise need admission and I discussed with Dr. Randol Kern for admission.  Asked for me to add on a CT of abdomen given the vomiting.         Final Clinical Impression(s) / ED Diagnoses Final diagnoses:  Seizures (HCC)  Acute respiratory failure with hypoxia [facility])    Rx / DC Orders ED Discharge Orders     None         Pricilla Loveless, MD 02/12/23 1544

## 2023-02-12 NOTE — Consult Note (Signed)
I connected with  Sean Bruce on 02/12/23 by a video enabled telemedicine application and verified that I am speaking with the correct person using two identifiers.   I discussed the limitations of evaluation and management by telemedicine. The patient is in vegetative state so unable to express understanding and agreed to proceed.   Location of patent: [facility] Location of physician: [facility]   Neurology Consultation Reason for Consult: seizure Referring Physician: Dr Pricilla Loveless  CC: seizure  History is obtained from: chart review as patient in vegetative state  HPI: Sean Bruce is a 27 y.o. male 27 y.o. male with history of TBI status post right decompressive pterional craniotomy, persistent vegetative state who was brought in from nursing facility for vomiting and concern for aspiration. Reportedly had one seizure whine enroute with ems described as GTC seizure lasting about 10 minutes. While in ER, he had atleast one more GTC seizure. He was given 5mg  if IV ativan for the seizures, last administered at 1331. He was also loaded with IV Keppra 1500 mg at 1243. He was also started on antibiotics due to concern for aspiration pneumonia. Neurology was consulted because patient continued to have forced left gaze deviation as well as nystagmus.   Home AEDs: LEV 750mg  BID  ROS:  Unable to obtain due to altered mental status.   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 No pertinent family history.   Social History:  reports that he has never smoked. He has never used smokeless tobacco. He reports that he does not currently use alcohol. He reports that he does not currently use drugs.   Exam: Current vital  signs: BP (!) 133/110   Pulse (!) 128   Temp (!) 96.7 F (35.9 C) (Rectal)   Resp (!) 27   Ht 6' (1.829 m)   Wt 75.4 kg   SpO2 94%   BMI 22.54 kg/m  Vital signs in last 24 hours: Temp:  [96.7 F (35.9 C)] 96.7 F (35.9 C) (08/15 1200) Pulse Rate:  [113-133] 128 (08/15 1400) Resp:  [20-34] 27 (08/15 1400) BP: (131-177)/(110-163) 133/110 (08/15 1400) SpO2:  [92 %-94 %] 94 % (08/15 1400) Weight:  [75.4 kg] 75.4 kg (08/15 1159)   Physical Exam  Constitutional: laying in bed, NAD Neuro: awake, has left gaze deviation,pr RN now can come to midline but not crossing midline yet,  doesn't follow commands ( baseline), left UE is flexed at elbow and has increased tone, mildly increased tone in RUE per RN, no withdrawal to noxius stimuli in Bl UE, increased tone in BL LE wit withdrawal to noxious stimuli   I have reviewed labs in epic and the results pertinent to this consultation are: CBC:  Recent Labs  Lab 02/12/23 1242  WBC 20.1*  NEUTROABS 16.5*  HGB 18.0*  HCT 55.4*  MCV 92.0  PLT 228    Basic Metabolic Panel:  Lab Results  Component Value Date   NA 139 02/12/2023   K 3.8 02/12/2023   CO2 25 02/12/2023   GLUCOSE 130 (H) 02/12/2023   BUN 18 02/12/2023   CREATININE 0.58 (L) 02/12/2023   CALCIUM 10.4 (H) 02/12/2023   GFRNONAA >60 02/12/2023  Lipid Panel: No results found for: "LDLCALC" HgbA1c:  Lab Results  Component Value Date   HGBA1C 5.2 09/27/2021   Urine Drug Screen: No results found for: "LABOPIA", "COCAINSCRNUR", "LABBENZ", "AMPHETMU", "THCU", "LABBARB"  Alcohol Level     Component Value Date/Time   ETH <10 09/25/2021 2308     I have reviewed the images obtained:  CT Head without contrast 02/12/2023:No acute intracranial abnormality. Postsurgical changes from a right hemicraniectomy. Unchanged encephalomalacia in the bilateral frontal lobes, right temporal lobe, and right occipital lobe.  rEEG 05/19/2022: This study is suggestive of moderate to  severe diffuse encephalopathy, nonspecific etiology. No seizures or epileptiform discharges were seen throughout the recording.   ASSESSMENT/PLAN: 27 year old male with history of TBI status post right decompressive pterional craniotomy, persistent vegetative state with trach and PEG placement who initially presented with vomiting and aspiration which has since resolved.  However while in the hospital, patient was noted to have breakthrough seizures.   Epilepsy with focal convulsive status epilepticus Chronic vegetative state -Likely breakthrough seizures in setting of infection - Continues to have forced LEFT gaze deviation with nystagmus which with his history of TBI and right craniotomy is likely focal status epilepticus   Recommendations: - EEG to assess for seizures. Unfortunately we dont have after hours EEG at [facility]. However, with improvement in his exam and his baseline neurologic function as well as DNR status, he will not be a candidate for intubation and aggressive management. Therefore, we will treat this clinically and kep him at [facility] for now.  - I will give him another dose of IV keppra 1000mg  ( so total 2500mg  in last 4-5 hours) as he still has left gaze deviation - Will start Keppra 750mg  BID from tomorrow - During previous admission in Nov 2023, clonazepm taper worked well for his breakthrough seizure. Therefore, if seizures recur after increasing keppra, can start clonazepam 0.5mg  TID -Continue seizure precaution -If EEG tomorrow does not show any evidence of seizures and patient remains seizure-free overnight, then recommend continueed management per primary team -Recommend follow-up with neurology in 3 months -Continue seizure precaution - PRN IV ativan for GTC seizure -Discussed plan with Dr. Silvestre Moment   Thank you for allowing [place] to participate in the care of this patient. If you have any further questions, please contact  me or neurohospitalist.    Lindie Spruce Epilepsy [facility] neurohospitalist

## 2023-02-13 ENCOUNTER — Inpatient Hospital Stay (HOSPITAL_COMMUNITY): Admit: 2023-02-13 | Payer: Medicaid Other

## 2023-02-13 ENCOUNTER — Encounter (HOSPITAL_COMMUNITY): Payer: Self-pay | Admitting: Internal Medicine

## 2023-02-13 DIAGNOSIS — R569 Unspecified convulsions: Secondary | ICD-10-CM | POA: Diagnosis not present

## 2023-02-13 DIAGNOSIS — Z7189 Other specified counseling: Secondary | ICD-10-CM | POA: Diagnosis not present

## 2023-02-13 DIAGNOSIS — G40909 Epilepsy, unspecified, not intractable, without status epilepticus: Secondary | ICD-10-CM | POA: Diagnosis not present

## 2023-02-13 DIAGNOSIS — Z515 Encounter for palliative care: Secondary | ICD-10-CM | POA: Diagnosis not present

## 2023-02-13 DIAGNOSIS — J690 Pneumonitis due to inhalation of food and vomit: Secondary | ICD-10-CM | POA: Diagnosis not present

## 2023-02-13 DIAGNOSIS — R7881 Bacteremia: Secondary | ICD-10-CM | POA: Insufficient documentation

## 2023-02-13 LAB — GLUCOSE, CAPILLARY: Glucose-Capillary: 104 mg/dL — ABNORMAL HIGH (ref 70–99)

## 2023-02-13 LAB — BLOOD CULTURE ID PANEL (REFLEXED) - BCID2

## 2023-02-13 LAB — MRSA NEXT GEN BY PCR, NASAL: MRSA by PCR Next Gen: DETECTED — AB

## 2023-02-13 MED ORDER — SCOPOLAMINE 1 MG/3DAYS TD PT72
1.0000 | MEDICATED_PATCH | TRANSDERMAL | Status: DC
  Administered 2023-02-13 – 2023-02-16 (×2): 1.5 mg via TRANSDERMAL
  Filled 2023-02-13 (×2): qty 1

## 2023-02-13 MED ORDER — BIOTENE DRY MOUTH MT LIQD: 15.0000 mL | OROMUCOSAL | Status: DC | PRN

## 2023-02-13 MED ORDER — GLYCOPYRROLATE 0.2 MG/ML IJ SOLN
0.2000 mg | INTRAMUSCULAR | Status: DC | PRN
  Administered 2023-02-13: 0.2 mg via INTRAVENOUS
  Filled 2023-02-13: qty 1

## 2023-02-13 MED ORDER — LEVETIRACETAM IN NACL 1000 MG/100ML IV SOLN: 1000.0000 mg | Freq: Two times a day (BID) | INTRAVENOUS | Status: DC

## 2023-02-13 MED ORDER — ONDANSETRON HCL 4 MG/2ML IJ SOLN
4.0000 mg | Freq: Four times a day (QID) | INTRAMUSCULAR | Status: DC | PRN
  Administered 2023-02-13: 4 mg via INTRAVENOUS
  Filled 2023-02-13: qty 2

## 2023-02-13 MED ORDER — HALOPERIDOL 0.5 MG PO TABS: 0.5000 mg | ORAL_TABLET | ORAL | Status: DC | PRN

## 2023-02-13 MED ORDER — GLYCOPYRROLATE 1 MG PO TABS: 1.0000 mg | ORAL_TABLET | ORAL | Status: DC | PRN

## 2023-02-13 MED ORDER — LORAZEPAM 2 MG/ML IJ SOLN
2.0000 mg | INTRAMUSCULAR | Status: DC
  Administered 2023-02-13 – 2023-02-16 (×19): 2 mg via INTRAVENOUS
  Filled 2023-02-13 (×20): qty 1

## 2023-02-13 MED ORDER — POLYVINYL ALCOHOL 1.4 % OP SOLN: 1.0000 [drp] | Freq: Four times a day (QID) | OPHTHALMIC | Status: DC | PRN

## 2023-02-13 MED ORDER — ACETAMINOPHEN 325 MG PO TABS: 650.0000 mg | ORAL_TABLET | Freq: Four times a day (QID) | ORAL | Status: DC | PRN

## 2023-02-13 MED ORDER — ONDANSETRON 4 MG PO TBDP: 4.0000 mg | ORAL_TABLET | Freq: Four times a day (QID) | ORAL | Status: DC | PRN

## 2023-02-13 MED ORDER — LEVETIRACETAM IN NACL 1000 MG/100ML IV SOLN
1000.0000 mg | INTRAVENOUS | Status: AC
  Administered 2023-02-13: 1000 mg via INTRAVENOUS
  Filled 2023-02-13: qty 100

## 2023-02-13 MED ORDER — HALOPERIDOL LACTATE 2 MG/ML PO CONC: 0.5000 mg | ORAL | Status: DC | PRN

## 2023-02-13 MED ORDER — CHLORHEXIDINE GLUCONATE CLOTH 2 % EX PADS
6.0000 | MEDICATED_PAD | Freq: Every day | CUTANEOUS | Status: DC
  Administered 2023-02-13: 6 via TOPICAL

## 2023-02-13 MED ORDER — HALOPERIDOL LACTATE 5 MG/ML IJ SOLN: 0.5000 mg | INTRAMUSCULAR | Status: DC | PRN

## 2023-02-13 MED ORDER — MORPHINE 100MG IN NS 100ML (1MG/ML) PREMIX INFUSION
1.0000 mg/h | INTRAVENOUS | Status: DC
  Administered 2023-02-13 – 2023-02-16 (×5): 5 mg/h via INTRAVENOUS
  Filled 2023-02-13 (×5): qty 100

## 2023-02-13 MED ORDER — GLYCOPYRROLATE 0.2 MG/ML IJ SOLN: 0.2000 mg | INTRAMUSCULAR | Status: DC | PRN

## 2023-02-13 MED ORDER — ACETAMINOPHEN 650 MG RE SUPP: 650.0000 mg | Freq: Four times a day (QID) | RECTAL | Status: DC | PRN

## 2023-02-13 MED ORDER — ONDANSETRON HCL 4 MG/2ML IJ SOLN: 4.0000 mg | Freq: Four times a day (QID) | INTRAMUSCULAR | Status: DC | PRN
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
## 2023-02-13 NOTE — Assessment & Plan Note (Addendum)
Chronic indwelling Foley Urinalysis reviewed and more consistent with colonization Continue Foley in setting of comfort care
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
## 2023-02-13 NOTE — Progress Notes (Signed)
I connected with  Sean Bruce on 02/13/23 by a video enabled telemedicine application and verified that I am speaking with the correct person using two identifiers.   I discussed the limitations of evaluation and management by telemedicine. The patient is in vegetative state so unable to express understanding and agreed to proceed.   Location [facility]: [facility] Location of physician: [facility]   Subjective: No clinical seizures overnight but continues to have left gaze deviation with nystagmus  ROS: Unable to obtain due to poor mental status  Examination  Vital signs in last 24 hours: Temp:  [96.7 F (35.9 C)-98.7 F (37.1 C)] 98.7 F (37.1 C) (08/16 0400) Pulse Rate:  [110-133] 113 (08/16 0700) Resp:  [20-39] 35 (08/16 0700) BP: (111-177)/(78-163) 138/91 (08/16 0700) SpO2:  [92 %-100 %] 100 % (08/16 0700) Weight:  [69.2 kg-75.4 kg] 69.2 kg (08/16 0400)  General: lying in bed, NAD Neuro: opens eyes to repeated tactile stimuli, has left gaze deviation, with nystagmus ( difficult to evaluate on tele) doesn't follow commands ( baseline), left UE is flexed at elbow and has increased tone, mildly increased tone in RUE per RN, no withdrawal to noxius stimuli in Bl UE, increased tone in BL LE with withdrawal to noxious stimuli   Basic Metabolic Panel: Recent Labs  Lab 02/12/23 1242  NA 139  K 3.8  CL 97*  CO2 25  GLUCOSE 130*  BUN 18  CREATININE 0.58*  CALCIUM 10.4*  MG 1.7    CBC: Recent Labs  Lab 02/12/23 1242  WBC 20.1*  NEUTROABS 16.5*  HGB 18.0*  HCT 55.4*  MCV 92.0  PLT 228     Coagulation Studies: No results for input(s): "LABPROT", "INR" in the last 72 hours.  Imaging No new brain imaging  ASSESSMENT AND PLAN: 27 year old male with history of TBI status post right decompressive pterional craniotomy, persistent vegetative state with trach and PEG placement who initially presented with vomiting and aspiration which has since resolved.  However while in  the hospital, patient was noted to have breakthrough seizures.   Epilepsy with focal convulsive status epilepticus, resolved Persistent vegetative state -Likely breakthrough seizures in setting of infection - No clinical GTC seizure. Continues to have forced LEFT gaze deviation with nystagmus which with his history of TBI and right craniotomy is concerning for focal seizure vs post ictal state   Recommendations: - I will give him another dose of IV keppra 1000mg  as he still has left gaze deviation and increase maintenance to 1000mg  BID - During previous admission in Nov 2023, clonazepm taper worked well for his breakthrough seizure. Therefore, if seizures recur after increasing keppra, can start clonazepam 0.5mg  TID - Can call Dr Kenyon Ana for any clinical seizures over weekend. With his poor baseline neurologic function as well as DNR status, he will  likely not be a candidate for intubation and aggressive management. But if family changes their mind and want to pursue aggressive management, may need to transferred to [place] for LTM -Continue seizure precaution -Recommend follow-up with neurology in 3 months -Continue seizure precaution - PRN IV ativan for GTC seizure -Discussed plan with Dr. Frederick Peers   Thank you for allowing [place] to participate in the care of this patient. If you have any further questions, please contact  me or neurohospitalist.    I have spent a total of  36  minutes with the patient reviewing hospital notes,  test results, labs and examining the patient as well as establishing an  assessment and plan   > 50% of time was spent in direct patient care.     Lindie Spruce Epilepsy [facility] For questions after 5pm please refer to AMION to reach the Neurologist on call

## 2023-02-13 NOTE — Procedures (Addendum)
Patient Name: Sean Bruce  MRN: [id number]  Epilepsy Attending: Charlsie Quest  Referring Physician/Provider: Pricilla Loveless, MD  Date: 02/13/2023 Duration: 22.29 mins  Patient history: 27 year old male with history of TBI status post right decompressive pterional craniotomy, persistent vegetative state with trach and PEG placement who initially presented with vomiting and aspiration which has since resolved. However while in the hospital, patient was noted to have breakthrough seizures. EEG to evaluate for seizure  Level of alertness:  awake/lethargic   AEDs during EEG study: LEV  Technical aspects: This EEG study was done with scalp electrodes positioned according to the 10-20 International system of electrode placement. Electrical activity was reviewed with band pass filter of 1-70Hz , sensitivity of 7 uV/mm, display speed of 76mm/sec with a 60Hz  notched filter applied as appropriate. EEG data were recorded continuously and digitally stored.  Video monitoring was available and reviewed as appropriate.  Description:EEG showed continuous generalized 3 to 6 Hz theta-delta slowing admixed with an excessive amount of 15 to 18 Hz beta activity distributed symmetrically and diffusely. Hyperventilation and photic stimulation were not performed.      ABNORMALITY - Continuous slow, generalized   IMPRESSION: This study is suggestive of moderate to severe diffuse encephalopathy, nonspecific etiology. No seizures or epileptiform discharges were seen throughout the recording.   Elky Funches Annabelle Harman

## 2023-02-13 NOTE — Sepsis Progress Note (Signed)
Confirmed with bedside RN Haze Justin that the blood cultures were drawn prior to giving the antibiotic.

## 2023-02-13 NOTE — Assessment & Plan Note (Addendum)
Aspiration event noted at facility prior to admission.  Remains ongoing aspiration risk with PEG tube feeds Started on antibiotics on admission and now transitioned to comfort care Had been on O2 for comfort but palliative care was concerned that this may be prolonging suffering and so it was stopped today

## 2023-02-13 NOTE — Progress Notes (Signed)
EEG complete - results pending 

## 2023-02-13 NOTE — Assessment & Plan Note (Deleted)
-  continue comfort care

## 2023-02-13 NOTE — Assessment & Plan Note (Addendum)
History of seizures, he was noted to have active seizures x 3, required total of 5 mg of IV Ativan, as well he was loaded with Keppra s/p EEG by neurology and consult appreciated No seizure activity noted on EEG but persistence of left gaze deviation with nystagmus noted Overall adds to his poor prognosis, poor quality of life and ongoing decline; agree with pursuing hospice/comfort care Medications have been modified after transitioning to comfort care
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
## 2023-02-13 NOTE — Progress Notes (Signed)
PHARMACY - PHYSICIAN COMMUNICATION CRITICAL VALUE ALERT - BLOOD CULTURE IDENTIFICATION (BCID)  Sean Bruce is an 27 y.o. male who presented to [facility] on 02/12/2023 with a chief complaint of aspiration  Assessment:  1/3 bottles, aerobic bottle, Methicillin-resistant Staph epidermidis  Name of physician (or Provider) Contacted: Dr. Frederick Peers  Current antibiotics: None, patient is comfort care  Changes to prescribed antibiotics recommended:  Patient is comfort care . No additional antibiotic orders.  Results for orders placed or performed during the hospital encounter of 02/12/23  Blood Culture ID Panel (Reflexed) (Collected: 02/12/2023 12:42 PM)  Result Value Ref Range   Enterococcus faecalis NOT DETECTED NOT DETECTED   Enterococcus Faecium NOT DETECTED NOT DETECTED   Listeria monocytogenes NOT DETECTED NOT DETECTED   Staphylococcus species DETECTED (A) NOT DETECTED   Staphylococcus aureus (BCID) NOT DETECTED NOT DETECTED   Staphylococcus epidermidis DETECTED (A) NOT DETECTED   Staphylococcus lugdunensis NOT DETECTED NOT DETECTED   Streptococcus species NOT DETECTED NOT DETECTED   Streptococcus agalactiae NOT DETECTED NOT DETECTED   Streptococcus pneumoniae NOT DETECTED NOT DETECTED   Streptococcus pyogenes NOT DETECTED NOT DETECTED   A.calcoaceticus-baumannii NOT DETECTED NOT DETECTED   Bacteroides fragilis NOT DETECTED NOT DETECTED   Enterobacterales NOT DETECTED NOT DETECTED   Enterobacter cloacae complex NOT DETECTED NOT DETECTED   Escherichia coli NOT DETECTED NOT DETECTED   Klebsiella aerogenes NOT DETECTED NOT DETECTED   Klebsiella oxytoca NOT DETECTED NOT DETECTED   Klebsiella pneumoniae NOT DETECTED NOT DETECTED   Proteus species NOT DETECTED NOT DETECTED   Salmonella species NOT DETECTED NOT DETECTED   Serratia marcescens NOT DETECTED NOT DETECTED   Haemophilus influenzae NOT DETECTED NOT DETECTED   Neisseria meningitidis NOT DETECTED NOT DETECTED   Pseudomonas  aeruginosa NOT DETECTED NOT DETECTED   Stenotrophomonas maltophilia NOT DETECTED NOT DETECTED   Candida albicans NOT DETECTED NOT DETECTED   Candida auris NOT DETECTED NOT DETECTED   Candida glabrata NOT DETECTED NOT DETECTED   Candida krusei NOT DETECTED NOT DETECTED   Candida parapsilosis NOT DETECTED NOT DETECTED   Candida tropicalis NOT DETECTED NOT DETECTED   Cryptococcus neoformans/gattii NOT DETECTED NOT DETECTED   Methicillin resistance mecA/C DETECTED (A) NOT DETECTED    Will M. Dareen Piano, PharmD Clinical Pharmacist 02/13/2023 3:52 PM
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
## 2023-02-13 NOTE — Assessment & Plan Note (Signed)
-   admission cultures noted with 1/3 bottles with MRSE; likely a contaminate but in setting of comfort care will not pursue any additional workup or treatment

## 2023-02-13 NOTE — Assessment & Plan Note (Addendum)
Tachycardia, tachypnea, leukocytosis from suspected aspiration pneumonia Initially started on antibiotics which he has now been transitioned to comfort care Admission blood cultures noted with 1/3 bottles with MRSE; likely a contaminate but in setting of comfort care will not pursue any additional workup or treatment

## 2023-02-13 NOTE — Progress Notes (Signed)
Sean Bruce (lives in [place]) (legal guardian and aunt) called to mention that pt's mother Dwain Sarna) (Lives in [place] and [place] to [place]) just knows that pt has infections and is continuing to have seizures. Clista Bernhardt (Legal Guardian's daughter and lives in [place]) came by last night to see the pt herself. Vonda and Legal Guardian are both very concerned about the pt's well being and want him to go comfort. They are just concerned that the pt's mother is on her way to try to stop this and make pt full code.   Legal guardian was also wondering if the pt is able to get an assessment to analyze brain activity and to see if there are any changes. Based on the brain activity they say they are wanting to make him comfort.   Dr. Frederick Peers, Palliative [facility] updated

## 2023-02-14 DIAGNOSIS — A419 Sepsis, unspecified organism: Secondary | ICD-10-CM | POA: Diagnosis not present

## 2023-02-14 DIAGNOSIS — J690 Pneumonitis due to inhalation of food and vomit: Secondary | ICD-10-CM | POA: Diagnosis not present

## 2023-02-14 DIAGNOSIS — S069X0S Unspecified intracranial injury without loss of consciousness, sequela: Secondary | ICD-10-CM

## 2023-02-14 DIAGNOSIS — G40909 Epilepsy, unspecified, not intractable, without status epilepticus: Secondary | ICD-10-CM | POA: Diagnosis not present

## 2023-02-14 NOTE — Plan of Care (Signed)
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
## 2023-02-14 NOTE — Progress Notes (Addendum)
Wesson Holderbaum (pt's great aunt) [phone]) requested to be added in the chart. She wants to be involved with any palliative care/funeral arrangements when the pt's mother is unable to assist. Mother says she is currently emotionally overwhelmed and needed Ms. Donate's assistance to help cover for her.

## 2023-02-15 DIAGNOSIS — J690 Pneumonitis due to inhalation of food and vomit: Secondary | ICD-10-CM | POA: Diagnosis not present

## 2023-02-15 DIAGNOSIS — G40909 Epilepsy, unspecified, not intractable, without status epilepticus: Secondary | ICD-10-CM | POA: Diagnosis not present

## 2023-02-15 DIAGNOSIS — A419 Sepsis, unspecified organism: Secondary | ICD-10-CM | POA: Diagnosis not present

## 2023-02-15 NOTE — Progress Notes (Signed)
Pt's mother, great aunt and great aunt's daughter in the bedside all night and upon explaining about pt being on comfort care, they verbalized them not agreeing on future organ donation. Called [facility]- spoken with Francee Piccolo and they clarified that they have not reached to any next of kin or even POA for this matter.

## 2023-02-15 NOTE — Progress Notes (Signed)
Mother, Dwain Sarna at the bedside explained how she no longer wants Sean Bruce to have legal guardianship over pt, Sean Bruce. She is concerned that she has no participation with her son's palliative care and arrangements after death. Mother wants to take legal action to take over guardianship of son.
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
## 2023-02-16 DIAGNOSIS — J690 Pneumonitis due to inhalation of food and vomit: Secondary | ICD-10-CM | POA: Diagnosis not present

## 2023-02-16 DIAGNOSIS — Z515 Encounter for palliative care: Secondary | ICD-10-CM

## 2023-02-16 LAB — CULTURE, BLOOD (ROUTINE X 2): Special Requests: ADEQUATE

## 2023-02-17 LAB — CULTURE, BLOOD (ROUTINE X 2): Culture: NO GROWTH

## 2023-03-01 NOTE — Progress Notes (Signed)
Nutrition Brief Note  Chart reviewed. Pt now transitioning to comfort care.  No further nutrition interventions planned at this time.  Please re-consult as needed.   Jenifer W, RD, LDN, CDCES Registered Dietitian II Certified Diabetes Care and Education Specialist Please refer to [facility] for RD and/or RD on-call/weekend/after hours pager   

## 2023-03-01 NOTE — Progress Notes (Signed)
   02/19/2023 1419  Spiritual Encounters  Type of Visit Initial  Care provided to: Patient;Family  Conversation partners present during Programmer, systems;Other (comment)  Referral source IDT Rounds  Reason for visit End-of-life  OnCall Visit No  Spiritual Framework  Presenting Themes Meaning/purpose/sources of inspiration;Impactful experiences and emotions;Values and beliefs;Courage hope and growth  Community/Connection Family  Patient Stress Factors None identified  Family Stress Factors Exhausted;Loss;Loss of control;Major life changes  Interventions  Spiritual Care Interventions Made Compassionate presence;Reflective listening;Normalization of emotions;Established relationship of care and support;Narrative/life review;Decision-making support/facilitation;Prayer;Bereavement/grief support  Intervention Outcomes  Outcomes Connection to spiritual care;Awareness of support;Reduced anxiety;Reduced fear;Autonomy/agency;Patient family open to resources;Awareness around self/spiritual resourses  Spiritual Care Plan  Spiritual Care Issues Still Outstanding Chaplain will continue to follow   Found patient aunt bedside today and then met with patient mother in the family room. She was very open and welcomed chaplain warmly. Chaplain gave space for reflection of her fears and concerns today and how she feels a loss of control given her sons bodily decline and her absence of responsibility in all of that over the years. During the visit, she stated that she did accept that his body is dying but that this was very hard for her to process right now and understandably so. Chaplain assisted her in reflection around his life and upbringing as a young child and also his characteristics as a person. She laughed often and cried today during our visit. Chaplain provided prayer bedside and will remain available in order to provide spiritual support and to assess for spiritual need.   Rev. Jolyn Lent,  M.Div Chaplain

## 2023-03-01 NOTE — Progress Notes (Signed)
Palliative: Sean Bruce is lying quietly in bed.  Overall he appears comfortable.  He has been transition to comfort care by his legal guardian, Sean Bruce.  End-of-life order set in place.  Face-to-face discussion with chaplain and bedside nursing staff related to patient condition, needs.  Conference with attending, bedside nursing staff, chaplain, transition of care team related to patient condition, needs, goals of care, disposition.  Plan: Comfort care, end-of-life order set in place.  Anticipate in-hospital death today.  50 minutes  Lillia Carmel, NP Palliative medicine team Team phone [phone] Greater than 50% of this time was spent counseling and coordinating care related to the above assessment and plan.
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
## 2023-03-01 NOTE — Progress Notes (Signed)
Pt's IV's removed by mother prior to start of shift. Informed pt's mother to not remove medical equipment.

## 2023-03-01 DEATH — deceased
